# Patient Record
Sex: Male | Born: 1945
Health system: Southern US, Community
[De-identification: ages and names within clinical notes are randomized; demographics above are authoritative.]

## PROBLEM LIST (undated history)

## (undated) DIAGNOSIS — B019 Varicella without complication: Secondary | ICD-10-CM

## (undated) DIAGNOSIS — R079 Chest pain, unspecified: Secondary | ICD-10-CM

## (undated) DIAGNOSIS — R0609 Other forms of dyspnea: Secondary | ICD-10-CM

## (undated) DIAGNOSIS — K635 Polyp of colon: Secondary | ICD-10-CM

## (undated) DIAGNOSIS — I639 Cerebral infarction, unspecified: Secondary | ICD-10-CM

## (undated) DIAGNOSIS — E785 Hyperlipidemia, unspecified: Secondary | ICD-10-CM

## (undated) DIAGNOSIS — M1990 Unspecified osteoarthritis, unspecified site: Secondary | ICD-10-CM

## (undated) DIAGNOSIS — F32A Depression, unspecified: Secondary | ICD-10-CM

## (undated) DIAGNOSIS — Z9889 Other specified postprocedural states: Secondary | ICD-10-CM

## (undated) DIAGNOSIS — I10 Essential (primary) hypertension: Secondary | ICD-10-CM

## (undated) DIAGNOSIS — I509 Heart failure, unspecified: Secondary | ICD-10-CM

## (undated) DIAGNOSIS — R9439 Abnormal result of other cardiovascular function study: Secondary | ICD-10-CM

## (undated) DIAGNOSIS — R0600 Dyspnea, unspecified: Secondary | ICD-10-CM

## (undated) DIAGNOSIS — K5792 Diverticulitis of intestine, part unspecified, without perforation or abscess without bleeding: Secondary | ICD-10-CM

## (undated) DIAGNOSIS — I2510 Atherosclerotic heart disease of native coronary artery without angina pectoris: Secondary | ICD-10-CM

## (undated) DIAGNOSIS — R011 Cardiac murmur, unspecified: Secondary | ICD-10-CM

## (undated) DIAGNOSIS — E119 Type 2 diabetes mellitus without complications: Secondary | ICD-10-CM

## (undated) DIAGNOSIS — T7840XA Allergy, unspecified, initial encounter: Secondary | ICD-10-CM

## (undated) DIAGNOSIS — K219 Gastro-esophageal reflux disease without esophagitis: Secondary | ICD-10-CM

## (undated) DIAGNOSIS — F329 Major depressive disorder, single episode, unspecified: Secondary | ICD-10-CM

## (undated) HISTORY — DX: Depression, unspecified: F32.A

## (undated) HISTORY — PX: KNEE SURGERY: SHX244

## (undated) HISTORY — DX: Varicella without complication: B01.9

## (undated) HISTORY — DX: Other forms of dyspnea: R06.09

## (undated) HISTORY — DX: Cerebral infarction, unspecified: I63.9

## (undated) HISTORY — PX: SHOULDER SURGERY: SHX246

## (undated) HISTORY — DX: Diverticulitis of intestine, part unspecified, without perforation or abscess without bleeding: K57.92

## (undated) HISTORY — DX: Other specified postprocedural states: Z98.890

## (undated) HISTORY — DX: Abnormal result of other cardiovascular function study: R94.39

## (undated) HISTORY — DX: Polyp of colon: K63.5

## (undated) HISTORY — DX: Gastro-esophageal reflux disease without esophagitis: K21.9

## (undated) HISTORY — PX: TONSILLECTOMY: SUR1361

## (undated) HISTORY — DX: Chest pain, unspecified: R07.9

## (undated) HISTORY — PX: COLONOSCOPY: SHX174

## (undated) HISTORY — DX: Allergy, unspecified, initial encounter: T78.40XA

## (undated) HISTORY — DX: Hyperlipidemia, unspecified: E78.5

## (undated) HISTORY — DX: Major depressive disorder, single episode, unspecified: F32.9

## (undated) HISTORY — DX: Cardiac murmur, unspecified: R01.1

## (undated) HISTORY — DX: Dyspnea, unspecified: R06.00

---

## 2013-10-24 ENCOUNTER — Emergency Department (HOSPITAL_BASED_OUTPATIENT_CLINIC_OR_DEPARTMENT_OTHER)
Admission: EM | Admit: 2013-10-24 | Discharge: 2013-10-24 | Disposition: A | Discharge status: Home / Self Care | Payer: Medicare HMO | Attending: Emergency Medicine | Admitting: Emergency Medicine

## 2013-10-24 ENCOUNTER — Emergency Department (HOSPITAL_BASED_OUTPATIENT_CLINIC_OR_DEPARTMENT_OTHER): Payer: Medicare HMO

## 2013-10-24 ENCOUNTER — Encounter (HOSPITAL_BASED_OUTPATIENT_CLINIC_OR_DEPARTMENT_OTHER): Payer: Self-pay | Admitting: Emergency Medicine

## 2013-10-24 DIAGNOSIS — M129 Arthropathy, unspecified: Secondary | ICD-10-CM | POA: Insufficient documentation

## 2013-10-24 DIAGNOSIS — J189 Pneumonia, unspecified organism: Secondary | ICD-10-CM

## 2013-10-24 DIAGNOSIS — R071 Chest pain on breathing: Secondary | ICD-10-CM | POA: Insufficient documentation

## 2013-10-24 DIAGNOSIS — J159 Unspecified bacterial pneumonia: Secondary | ICD-10-CM | POA: Insufficient documentation

## 2013-10-24 DIAGNOSIS — Z7982 Long term (current) use of aspirin: Secondary | ICD-10-CM | POA: Insufficient documentation

## 2013-10-24 DIAGNOSIS — Z880 Allergy status to penicillin: Secondary | ICD-10-CM | POA: Insufficient documentation

## 2013-10-24 DIAGNOSIS — Z79899 Other long term (current) drug therapy: Secondary | ICD-10-CM | POA: Insufficient documentation

## 2013-10-24 DIAGNOSIS — I509 Heart failure, unspecified: Secondary | ICD-10-CM | POA: Insufficient documentation

## 2013-10-24 DIAGNOSIS — I10 Essential (primary) hypertension: Secondary | ICD-10-CM | POA: Insufficient documentation

## 2013-10-24 DIAGNOSIS — R0789 Other chest pain: Secondary | ICD-10-CM

## 2013-10-24 DIAGNOSIS — I2510 Atherosclerotic heart disease of native coronary artery without angina pectoris: Secondary | ICD-10-CM | POA: Insufficient documentation

## 2013-10-24 DIAGNOSIS — E119 Type 2 diabetes mellitus without complications: Secondary | ICD-10-CM | POA: Insufficient documentation

## 2013-10-24 HISTORY — DX: Heart failure, unspecified: I50.9

## 2013-10-24 HISTORY — DX: Type 2 diabetes mellitus without complications: E11.9

## 2013-10-24 HISTORY — DX: Unspecified osteoarthritis, unspecified site: M19.90

## 2013-10-24 HISTORY — DX: Essential (primary) hypertension: I10

## 2013-10-24 HISTORY — DX: Atherosclerotic heart disease of native coronary artery without angina pectoris: I25.10

## 2013-10-24 LAB — CBC WITH DIFFERENTIAL/PLATELET
Basophils Absolute: 0 10*3/uL (ref 0.0–0.1)
Basophils Relative: 0 % (ref 0–1)
Eosinophils Absolute: 0.2 10*3/uL (ref 0.0–0.7)
Eosinophils Relative: 3 % (ref 0–5)
HCT: 41.9 % (ref 39.0–52.0)
HEMOGLOBIN: 14.9 g/dL (ref 13.0–17.0)
Lymphocytes Relative: 29 % (ref 12–46)
Lymphs Abs: 2.5 10*3/uL (ref 0.7–4.0)
MCH: 32.7 pg (ref 26.0–34.0)
MCHC: 35.6 g/dL (ref 30.0–36.0)
MCV: 92.1 fL (ref 78.0–100.0)
MONOS PCT: 6 % (ref 3–12)
Monocytes Absolute: 0.5 10*3/uL (ref 0.1–1.0)
NEUTROS PCT: 62 % (ref 43–77)
Neutro Abs: 5.5 10*3/uL (ref 1.7–7.7)
Platelets: 170 10*3/uL (ref 150–400)
RBC: 4.55 MIL/uL (ref 4.22–5.81)
RDW: 13 % (ref 11.5–15.5)
WBC: 8.8 10*3/uL (ref 4.0–10.5)

## 2013-10-24 LAB — BASIC METABOLIC PANEL
BUN: 11 mg/dL (ref 6–23)
CHLORIDE: 104 meq/L (ref 96–112)
CO2: 26 mEq/L (ref 19–32)
Calcium: 9.8 mg/dL (ref 8.4–10.5)
Creatinine, Ser: 1.3 mg/dL (ref 0.50–1.35)
GFR calc Af Amer: 64 mL/min — ABNORMAL LOW (ref >90)
GFR calc non Af Amer: 55 mL/min — ABNORMAL LOW (ref >90)
Glucose, Bld: 229 mg/dL — ABNORMAL HIGH (ref 70–99)
Potassium: 4.4 mEq/L (ref 3.7–5.3)
Sodium: 142 mEq/L (ref 137–147)

## 2013-10-24 LAB — PRO B NATRIURETIC PEPTIDE: PRO B NATRI PEPTIDE: 361.5 pg/mL — AB (ref 0–125)

## 2013-10-24 LAB — TROPONIN I

## 2013-10-24 MED ORDER — HYDROCODONE-ACETAMINOPHEN 5-325 MG PO TABS: 1.0000 | ORAL_TABLET | ORAL | Status: DC | PRN

## 2013-10-24 MED ORDER — SODIUM CHLORIDE 0.9 % IV SOLN: INTRAVENOUS | Status: DC

## 2013-10-24 MED ORDER — LEVOFLOXACIN 750 MG PO TABS: 750.0000 mg | ORAL_TABLET | Freq: Every day | ORAL | Status: DC

## 2013-10-24 NOTE — ED Provider Notes (Signed)
CSN: [id number]     Arrival date & time 10/24/13  2114 History  This chart was scribed for Leota Jacobsen, MD by Maree Erie, ED Scribe. The patient was seen in room MH11/MH11. Patient's care was started at 9:42 PM.      Chief Complaint  Patient presents with  . Chest Pain      Patient is a 68 y.o. male presenting with chest pain. The history is provided by the patient. No language interpreter was used.  Chest Pain Associated symptoms: cough and headache   Associated symptoms: no diaphoresis and no shortness of breath     HPI Comments: Aaron Maldonado is a 68 y.o. male who presents to the Emergency Department complaining of intermittent chest pain that began a few weeks ago but worsened tonight. Symptoms began after she sustained a fall at home onto his chest wall. He describes the pain as pressure. The pain only occurs with certain movements, such as when coughing or bending over. He has been taking Advil without relief. He reports a dry cough that began a few weeks ago as well. He states he has had a headache for two days. He denies diaphoresis or shortness of breath with the pain. He was seen at an Urgent Care in [place] about three weeks ago for the same pain. Symptoms are persistent and are better when he remains still. No anginal-type qualities to his symptoms. He denies any orthopnea dyspnea on exertion.   Past Medical History  Diagnosis Date  . Coronary artery disease   . Diabetes mellitus without complication   . Hypertension   . Arthritis   . CHF (congestive heart failure)    Past Surgical History  Procedure Laterality Date  . Knee surgery    . Shoulder surgery     No family history on file. History  Substance Use Topics  . Smoking status: Never Smoker   . Smokeless tobacco: Not on file  . Alcohol Use: No    Review of Systems  Constitutional: Negative for diaphoresis.  Respiratory: Positive for cough. Negative for shortness of breath.   Cardiovascular:  Positive for chest pain.  Neurological: Positive for headaches.  All other systems reviewed and are negative.     Allergies  Keflex; Penicillins; and Sulfa antibiotics  Home Medications   Prior to Admission medications   Medication Sig Start Date End Date Taking? Authorizing Provider  allopurinol (ZYLOPRIM) 300 MG tablet Take 300 mg by mouth daily.   Yes Historical Provider, MD  aspirin 325 MG tablet Take 325 mg by mouth daily.   Yes Historical Provider, MD  atorvastatin (LIPITOR) 20 MG tablet Take 20 mg by mouth daily.   Yes Historical Provider, MD  furosemide (LASIX) 40 MG tablet Take 40 mg by mouth.   Yes Historical Provider, MD  losartan (COZAAR) 50 MG tablet Take 50 mg by mouth daily.   Yes Historical Provider, MD  PARoxetine (PAXIL) 20 MG tablet Take 20 mg by mouth daily.   Yes Historical Provider, MD   Triage Vitals: BP 172/86  Pulse 100  Temp(Src) 98.4 F (36.9 C) (Oral)  Ht 5\' 11"  (1.803 m)  Wt 240 lb (108.863 kg)  BMI 33.49 kg/m2  SpO2 100%  Physical Exam  Nursing note and vitals reviewed. Constitutional: He is oriented to person, place, and time. He appears well-developed and well-nourished.  Non-toxic appearance. No distress.  HENT:  Head: Normocephalic and atraumatic.  Eyes: Conjunctivae, EOM and lids are normal. Pupils are equal, round, and  reactive to light.  Neck: Normal range of motion. Neck supple. No tracheal deviation present. No mass present.  Cardiovascular: Normal rate, regular rhythm and normal heart sounds.  Exam reveals no gallop.   No murmur heard. Pulmonary/Chest: Effort normal and breath sounds normal. No stridor. No respiratory distress. He has no decreased breath sounds. He has no wheezes. He has no rhonchi. He has no rales. He exhibits tenderness.  Tender along left anterior chest wall without crepitus.   Abdominal: Soft. Normal appearance and bowel sounds are normal. He exhibits no distension. There is no tenderness. There is no rebound and no  CVA tenderness.  Musculoskeletal: Normal range of motion. He exhibits no edema and no tenderness.  Neurological: He is alert and oriented to person, place, and time. He has normal strength. No cranial nerve deficit or sensory deficit. GCS eye subscore is 4. GCS verbal subscore is 5. GCS motor subscore is 6.  Skin: Skin is warm and dry. No abrasion and no rash noted.  Psychiatric: He has a normal mood and affect. His speech is normal and behavior is normal.    ED Course  Procedures (including critical care time)  DIAGNOSTIC STUDIES: Oxygen Saturation is 100% on room air, normal by my interpretation.    COORDINATION OF CARE: 9:43 PM - Patient verbalizes understanding and agrees with treatment plan.    Labs Review Labs Reviewed  BASIC METABOLIC PANEL - Abnormal; Notable for the following:    Glucose, Bld 229 (*)    GFR calc non Af Amer 55 (*)    GFR calc Af Amer 64 (*)    All other components within normal limits  PRO B NATRIURETIC PEPTIDE - Abnormal; Notable for the following:    Pro B Natriuretic peptide (BNP) 361.5 (*)    All other components within normal limits  CBC WITH DIFFERENTIAL  TROPONIN I    Imaging Review Dg Chest 2 View  10/24/2013   CLINICAL DATA:  Chest pain and nonproductive cough with history of CHF. And diabetes  EXAM: CHEST  2 VIEW  COMPARISON:  None.  FINDINGS: The lungs are adequately inflated. The interstitial markings are increased bilaterally. Increased lung markings in the anterior aspect of the left lower lobe are present. The cardiac silhouette is mildly enlarged. The pulmonary vascularity is not engorged. There is no pleural effusion. Subtle nodularity projects over the lateral aspect of the right ninth rib.  IMPRESSION: Atelectasis or pneumonia anteriorly in the left lower lobe is suspected. There is likely underlying low-grade compensated CHF.   Electronically Signed   By: David  Martinique   On: 10/24/2013 22:34     EKG Interpretation   Date/Time:   Friday October 24 2013 21:23:19 EDT Ventricular Rate:  95 PR Interval:  160 QRS Duration: 80 QT Interval:  362 QTC Calculation: 229 R Axis:   108 Text Interpretation:  Sinus rhythm with frequent and consecutive Premature  ventricular complexes Rightward axis Abnormal ECG Confirmed by Zenia Resides  MD,  Courtlyn Aki ([id number]) on 10/24/2013 10:44:27 PM      MDM   Final diagnoses:  None   I personally performed the services described in this documentation, which was scribed in my presence. The recorded information has been reviewed and is accurate.  Patient with possible early pneumonia. This is likely due to his possible chest wall contusion. I do not think that this represents ACS. He has no signs of CHF at this time. He has no pedal edema. Has no orthopnea. BNP is mildly elevated.  Patient be treated with course of antibiotics and given return precautions. His ectopy was noted on his EKG he says that he has a history of this. He is stable for discharge    Leota Jacobsen, MD 10/24/13 2259

## 2013-10-24 NOTE — ED Notes (Signed)
MD at bedside. 

## 2013-10-24 NOTE — Discharge Instructions (Signed)
Chest Wall Pain Chest wall pain is pain in or around the bones and muscles of your chest. It may take up to 6 weeks to get better. It may take longer if you must stay physically active in your work and activities.  CAUSES  Chest wall pain may happen on its own. However, it may be caused by:  A viral illness like the flu.  Injury.  Coughing.  Exercise.  Arthritis.  Fibromyalgia.  Shingles. HOME CARE INSTRUCTIONS   Avoid overtiring physical activity. Try not to strain or perform activities that cause pain. This includes any activities using your chest or your abdominal and side muscles, especially if heavy weights are used.  Put ice on the sore area.  Put ice in a plastic bag.  Place a towel between your skin and the bag.  Leave the ice on for 15-20 minutes per hour while awake for the first 2 days.  Only take over-the-counter or prescription medicines for pain, discomfort, or fever as directed by your caregiver. SEEK IMMEDIATE MEDICAL CARE IF:   Your pain increases, or you are very uncomfortable.  You have a fever.  Your chest pain becomes worse.  You have new, unexplained symptoms.  You have nausea or vomiting.  You feel sweaty or lightheaded.  You have a cough with phlegm (sputum), or you cough up blood. MAKE SURE YOU:   Understand these instructions.  Will watch your condition.  Will get help right away if you are not doing well or get worse. Document Released: 05/08/2005 Document Revised: 07/31/2011 Document Reviewed: 01/02/2011 [facility] Patient Information 2014 [place], [place]. Pneumonia, Adult Pneumonia is an infection of the lungs.  CAUSES Pneumonia may be caused by bacteria or a virus. Usually, these infections are caused by breathing infectious particles into the lungs (respiratory tract). SYMPTOMS   Cough.  Fever.  Chest pain.  Increased rate of breathing.  Wheezing.  Mucus production. DIAGNOSIS  If you have the common symptoms of  pneumonia, your caregiver will typically confirm the diagnosis with a chest X-ray. The X-ray will show an abnormality in the lung (pulmonary infiltrate) if you have pneumonia. Other tests of your blood, urine, or sputum may be done to find the specific cause of your pneumonia. Your caregiver may also do tests (blood gases or pulse oximetry) to see how well your lungs are working. TREATMENT  Some forms of pneumonia may be spread to other people when you cough or sneeze. You may be asked to wear a mask before and during your exam. Pneumonia that is caused by bacteria is treated with antibiotic medicine. Pneumonia that is caused by the influenza virus may be treated with an antiviral medicine. Most other viral infections must run their course. These infections will not respond to antibiotics.  PREVENTION A pneumococcal shot (vaccine) is available to prevent a common bacterial cause of pneumonia. This is usually suggested for:  People over 61 years old.  Patients on chemotherapy.  People with chronic lung problems, such as bronchitis or emphysema.  People with immune system problems. If you are over 65 or have a high risk condition, you may receive the pneumococcal vaccine if you have not received it before. In some countries, a routine influenza vaccine is also recommended. This vaccine can help prevent some cases of pneumonia.You may be offered the influenza vaccine as part of your care. If you smoke, it is time to quit. You may receive instructions on how to stop smoking. Your caregiver can provide medicines and counseling  to help you quit. HOME CARE INSTRUCTIONS   Cough suppressants may be used if you are losing too much rest. However, coughing protects you by clearing your lungs. You should avoid using cough suppressants if you can.  Your caregiver may have prescribed medicine if he or she thinks your pneumonia is caused by a bacteria or influenza. Finish your medicine even if you start to feel  better.  Your caregiver may also prescribe an expectorant. This loosens the mucus to be coughed up.  Only take over-the-counter or prescription medicines for pain, discomfort, or fever as directed by your caregiver.  Do not smoke. Smoking is a common cause of bronchitis and can contribute to pneumonia. If you are a smoker and continue to smoke, your cough may last several weeks after your pneumonia has cleared.  A cold steam vaporizer or humidifier in your room or home may help loosen mucus.  Coughing is often worse at night. Sleeping in a semi-upright position in a recliner or using a couple pillows under your head will help with this.  Get rest as you feel it is needed. Your body will usually let you know when you need to rest. SEEK IMMEDIATE MEDICAL CARE IF:   Your illness becomes worse. This is especially true if you are elderly or weakened from any other disease.  You cannot control your cough with suppressants and are losing sleep.  You begin coughing up blood.  You develop pain which is getting worse or is uncontrolled with medicines.  You have a fever.  Any of the symptoms which initially brought you in for treatment are getting worse rather than better.  You develop shortness of breath or chest pain. MAKE SURE YOU:   Understand these instructions.  Will watch your condition.  Will get help right away if you are not doing well or get worse. Document Released: 05/08/2005 Document Revised: 07/31/2011 Document Reviewed: 07/28/2010 [facility] Patient Information 2014 [place], [place].

## 2013-10-24 NOTE — ED Notes (Signed)
Chest pain, dry cough headache for a week. Sneezing or movement causes pressure in his chest.

## 2013-10-24 NOTE — ED Notes (Signed)
Pt sts pain x 2-3 weeks.  Reports moving makes the pain worse.  Sts that he does feel some SHOB.

## 2013-11-12 ENCOUNTER — Emergency Department (HOSPITAL_BASED_OUTPATIENT_CLINIC_OR_DEPARTMENT_OTHER): Payer: Medicare HMO

## 2013-11-12 ENCOUNTER — Encounter (HOSPITAL_BASED_OUTPATIENT_CLINIC_OR_DEPARTMENT_OTHER): Payer: Self-pay | Admitting: Emergency Medicine

## 2013-11-12 ENCOUNTER — Emergency Department (HOSPITAL_BASED_OUTPATIENT_CLINIC_OR_DEPARTMENT_OTHER)
Admission: EM | Admit: 2013-11-12 | Discharge: 2013-11-12 | Disposition: A | Discharge status: Home / Self Care | Payer: Medicare HMO | Attending: Emergency Medicine | Admitting: Emergency Medicine

## 2013-11-12 DIAGNOSIS — K219 Gastro-esophageal reflux disease without esophagitis: Secondary | ICD-10-CM

## 2013-11-12 DIAGNOSIS — Z79899 Other long term (current) drug therapy: Secondary | ICD-10-CM | POA: Insufficient documentation

## 2013-11-12 DIAGNOSIS — E119 Type 2 diabetes mellitus without complications: Secondary | ICD-10-CM | POA: Insufficient documentation

## 2013-11-12 DIAGNOSIS — Z880 Allergy status to penicillin: Secondary | ICD-10-CM | POA: Insufficient documentation

## 2013-11-12 DIAGNOSIS — Z792 Long term (current) use of antibiotics: Secondary | ICD-10-CM | POA: Insufficient documentation

## 2013-11-12 DIAGNOSIS — Z7982 Long term (current) use of aspirin: Secondary | ICD-10-CM | POA: Insufficient documentation

## 2013-11-12 DIAGNOSIS — I10 Essential (primary) hypertension: Secondary | ICD-10-CM | POA: Insufficient documentation

## 2013-11-12 DIAGNOSIS — M129 Arthropathy, unspecified: Secondary | ICD-10-CM | POA: Insufficient documentation

## 2013-11-12 DIAGNOSIS — I2510 Atherosclerotic heart disease of native coronary artery without angina pectoris: Secondary | ICD-10-CM | POA: Insufficient documentation

## 2013-11-12 DIAGNOSIS — I509 Heart failure, unspecified: Secondary | ICD-10-CM | POA: Insufficient documentation

## 2013-11-12 LAB — CBC
HCT: 40.6 % (ref 39.0–52.0)
Hemoglobin: 14.5 g/dL (ref 13.0–17.0)
MCH: 32.5 pg (ref 26.0–34.0)
MCHC: 35.7 g/dL (ref 30.0–36.0)
MCV: 91 fL (ref 78.0–100.0)
PLATELETS: 178 10*3/uL (ref 150–400)
RBC: 4.46 MIL/uL (ref 4.22–5.81)
RDW: 12.9 % (ref 11.5–15.5)
WBC: 9.1 10*3/uL (ref 4.0–10.5)

## 2013-11-12 LAB — BASIC METABOLIC PANEL
BUN: 10 mg/dL (ref 6–23)
CHLORIDE: 103 meq/L (ref 96–112)
CO2: 27 meq/L (ref 19–32)
Calcium: 10.1 mg/dL (ref 8.4–10.5)
Creatinine, Ser: 1 mg/dL (ref 0.50–1.35)
GFR calc non Af Amer: 75 mL/min — ABNORMAL LOW (ref >90)
GFR, EST AFRICAN AMERICAN: 87 mL/min — AB (ref >90)
Glucose, Bld: 116 mg/dL — ABNORMAL HIGH (ref 70–99)
POTASSIUM: 4.2 meq/L (ref 3.7–5.3)
SODIUM: 141 meq/L (ref 137–147)

## 2013-11-12 LAB — TROPONIN I

## 2013-11-12 MED ORDER — IOHEXOL 350 MG/ML SOLN
80.0000 mL | Freq: Once | INTRAVENOUS | Status: AC | PRN
  Administered 2013-11-12: 80 mL via INTRAVENOUS

## 2013-11-12 MED ORDER — GI COCKTAIL ~~LOC~~
ORAL | Status: AC
  Filled 2013-11-12: qty 30

## 2013-11-12 MED ORDER — GI COCKTAIL ~~LOC~~
10.0000 mL | Freq: Once | ORAL | Status: AC
  Administered 2013-11-12: 10 mL via ORAL

## 2013-11-12 MED ORDER — KETOROLAC TROMETHAMINE 30 MG/ML IJ SOLN
30.0000 mg | Freq: Once | INTRAMUSCULAR | Status: AC
  Administered 2013-11-12: 30 mg via INTRAVENOUS
  Filled 2013-11-12: qty 1

## 2013-11-12 MED ORDER — OMEPRAZOLE 20 MG PO CPDR: 20.0000 mg | DELAYED_RELEASE_CAPSULE | Freq: Every day | ORAL | Status: DC

## 2013-11-12 NOTE — ED Provider Notes (Signed)
CSN: [id number]     Arrival date & time 11/12/13  0130 History   First MD Initiated Contact with Patient 11/12/13 0210     Chief Complaint  Patient presents with  . Chest Pain     (Consider location/radiation/quality/duration/timing/severity/associated sxs/prior Treatment) Patient is a 68 y.o. male presenting with chest pain. The history is provided by the patient.  Chest Pain Pain location:  L chest Pain quality: dull   Pain radiates to:  Does not radiate Pain radiates to the back: no   Pain severity:  Moderate Onset quality:  Gradual Duration:  18 hours Timing:  Constant Progression:  Unchanged Chronicity:  Recurrent Context: not raising an arm and no trauma   Relieved by:  Nothing Worsened by:  Nothing tried Ineffective treatments:  None tried Associated symptoms: no cough, no diaphoresis, no fever and no shortness of breath   Risk factors: no surgery     Past Medical History  Diagnosis Date  . Coronary artery disease   . Diabetes mellitus without complication   . Hypertension   . Arthritis   . CHF (congestive heart failure)    Past Surgical History  Procedure Laterality Date  . Knee surgery    . Shoulder surgery     History reviewed. No pertinent family history. History  Substance Use Topics  . Smoking status: Never Smoker   . Smokeless tobacco: Not on file  . Alcohol Use: No    Review of Systems  Constitutional: Negative for fever and diaphoresis.  Respiratory: Negative for cough and shortness of breath.   Cardiovascular: Positive for chest pain.  All other systems reviewed and are negative.     Allergies  Keflex; Penicillins; and Sulfa antibiotics  Home Medications   Prior to Admission medications   Medication Sig Start Date End Date Taking? Authorizing Provider  allopurinol (ZYLOPRIM) 300 MG tablet Take 300 mg by mouth daily.   Yes Historical Provider, MD  aspirin 325 MG tablet Take 325 mg by mouth daily.   Yes Historical Provider, MD   atorvastatin (LIPITOR) 20 MG tablet Take 20 mg by mouth daily.   Yes Historical Provider, MD  furosemide (LASIX) 40 MG tablet Take 40 mg by mouth.   Yes Historical Provider, MD  losartan (COZAAR) 50 MG tablet Take 50 mg by mouth daily.   Yes Historical Provider, MD  PARoxetine (PAXIL) 20 MG tablet Take 20 mg by mouth daily.   Yes Historical Provider, MD  HYDROcodone-acetaminophen (NORCO/VICODIN) 5-325 MG per tablet Take 1-2 tablets by mouth every 4 (four) hours as needed for moderate pain or severe pain. 10/24/13   Leota Jacobsen, MD  levofloxacin (LEVAQUIN) 750 MG tablet Take 1 tablet (750 mg total) by mouth daily. 10/24/13   Leota Jacobsen, MD  omeprazole (PRILOSEC) 20 MG capsule Take 1 capsule (20 mg total) by mouth daily. 11/12/13   April K Palumbo-Rasch, MD   BP 142/66  Pulse 61  Temp(Src) 98.4 F (36.9 C) (Oral)  Resp 18  Ht 5\' 11"  (1.803 m)  Wt 225 lb (102.059 kg)  BMI 31.39 kg/m2  SpO2 100% Physical Exam  Constitutional: He is oriented to person, place, and time. He appears well-developed and well-nourished. No distress.  HENT:  Head: Normocephalic and atraumatic.  Mouth/Throat: Oropharynx is clear and moist.  Eyes: Conjunctivae are normal. Pupils are equal, round, and reactive to light.  Neck: Normal range of motion. Neck supple.  Cardiovascular: Normal rate, regular rhythm and intact distal pulses.   Pulmonary/Chest: Effort  normal and breath sounds normal. No respiratory distress. He has no wheezes. He has no rales.  Abdominal: Soft. Bowel sounds are increased. There is no tenderness. There is no rebound and no guarding.  Hyperactive bowel sounds most intense over the epigastrum and heard well into the thoracic cavity  Musculoskeletal: Normal range of motion.  Neurological: He is alert and oriented to person, place, and time.  Skin: Skin is warm and dry.  Psychiatric: He has a normal mood and affect.    ED Course  Procedures (including critical care time) Labs  Review Labs Reviewed  BASIC METABOLIC PANEL - Abnormal; Notable for the following:    Glucose, Bld 116 (*)    GFR calc non Af Amer 75 (*)    GFR calc Af Amer 87 (*)    All other components within normal limits  CBC  TROPONIN I    Imaging Review Dg Chest 2 View  11/12/2013   CLINICAL DATA:  Intermittent right-sided chest pain and headache since last night.  EXAM: CHEST  2 VIEW  COMPARISON:  10/24/2013  FINDINGS: Normal heart size and pulmonary vascularity. Persistent infiltration or atelectasis in the left lingula causing some obscuration of the left heart border. This is unchanged since previous study. No blunting of costophrenic angles. No pneumothorax. Degenerative changes in the spine.  IMPRESSION: Infiltration or atelectasis suggested in the left lingula without significant interval progression.   Electronically Signed   By: Lucienne Capers M.D.   On: 11/12/2013 02:21   Ct Angio Chest Pe W/cm &/or Wo Cm  11/12/2013   CLINICAL DATA:  Chest pain. Headache. Persistent lesion on chest x-ray.  EXAM: CT ANGIOGRAPHY CHEST WITH CONTRAST  TECHNIQUE: Multidetector CT imaging of the chest was performed using the standard protocol during bolus administration of intravenous contrast. Multiplanar CT image reconstructions and MIPs were obtained to evaluate the vascular anatomy.  CONTRAST:  1mL OMNIPAQUE IOHEXOL 350 MG/ML SOLN  COMPARISON:  None.  FINDINGS: Technically adequate study with good opacification of the central and segmental pulmonary arteries. No focal filling defects demonstrated. No evidence of significant pulmonary embolus.  Mild cardiac enlargement. Mild ectasia of the descending thoracic aorta. Calcification of aorta. Mild Coronary artery calcification. No evidence of aortic dissection. Great vessel origins are patent. Esophagus is decompressed. Scattered lymph nodes in the mediastinum are not pathologically enlarged. No pleural effusions. Visualized portions of the upper abdominal organs are  grossly unremarkable.  Evaluation of lung feels is limited due to respiratory motion artifact. No significant airspace disease or consolidation is suggested. Appearance of infiltration on chest radiograph corresponds to prominent pericardiac fat. No pneumothorax. Degenerative changes in the spine. No destructive bone lesions.  Review of the MIP images confirms the above findings.  IMPRESSION: No evidence of significant pulmonary embolus.   Electronically Signed   By: Lucienne Capers M.D.   On: 11/12/2013 03:18     EKG Interpretation None      MDM   Final diagnoses:  Gastroesophageal reflux disease, esophagitis presence not specified     Date: 11/12/2013  Rate: 62  Rhythm: normal sinus rhythm  QRS Axis: right  Intervals: normal  ST/T Wave abnormalities: normal  Conduction Disutrbances:none  Narrative Interpretation: pvcs  Old EKG Reviewed: none available   Symptoms not consistent with ACS.  In the setting of > 8 hours of constant pain with negative EKG and troponin ACS is excluded.  Symptoms consistent with GERD.  Will treat follow up with your regular doctor for ongoing care.  Carlisle Beers, MD 11/12/13 [phone]

## 2013-11-12 NOTE — ED Notes (Addendum)
Pt reports intermittent right sided chest pain - denies pain at present. States he has had a headache since last night and was unable to sleep. States BP elevated PTA. Reports out of Lasix and thinks this is causing his problems. EKG obtained. Pt states recently treated for Pneumonia.

## 2013-11-25 ENCOUNTER — Encounter: Payer: Self-pay | Admitting: Cardiovascular Disease

## 2013-11-25 ENCOUNTER — Ambulatory Visit (INDEPENDENT_AMBULATORY_CARE_PROVIDER_SITE_OTHER): Payer: Medicare HMO | Admitting: Cardiovascular Disease

## 2013-11-25 VITALS — BP 144/76 | HR 84 | Ht 71.0 in | Wt 223.2 lb

## 2013-11-25 DIAGNOSIS — R0989 Other specified symptoms and signs involving the circulatory and respiratory systems: Secondary | ICD-10-CM

## 2013-11-25 DIAGNOSIS — I10 Essential (primary) hypertension: Secondary | ICD-10-CM

## 2013-11-25 DIAGNOSIS — E119 Type 2 diabetes mellitus without complications: Secondary | ICD-10-CM

## 2013-11-25 DIAGNOSIS — Z79899 Other long term (current) drug therapy: Secondary | ICD-10-CM

## 2013-11-25 DIAGNOSIS — R0609 Other forms of dyspnea: Secondary | ICD-10-CM | POA: Insufficient documentation

## 2013-11-25 DIAGNOSIS — E785 Hyperlipidemia, unspecified: Secondary | ICD-10-CM

## 2013-11-25 DIAGNOSIS — Z794 Long term (current) use of insulin: Secondary | ICD-10-CM

## 2013-11-25 DIAGNOSIS — R079 Chest pain, unspecified: Secondary | ICD-10-CM

## 2013-11-25 MED ORDER — LOSARTAN POTASSIUM 50 MG PO TABS: 50.0000 mg | ORAL_TABLET | Freq: Every day | ORAL | Status: DC

## 2013-11-25 NOTE — Assessment & Plan Note (Signed)
Six-month history of exertional chest pain and dyspnea. He has positive risk factors including treated diabetes, hypertension and hyperlipidemia. I'm going to obtain an exercise Myoview stress test and 2-D echocardiogram

## 2013-11-25 NOTE — Patient Instructions (Signed)
  We will see you back in follow up after the tests.    Dr Gwenlyn Found has ordered : 1. Lexiscan Myoview- this is a test that looks at the blood flow to your heart muscle.  It takes approximately 2 1/2 hours. Please follow instruction sheet, as given.   2.  Echocardiogram. Echocardiography is a painless test that uses sound waves to create images of your heart. It provides your doctor with information about the size and shape of your heart and how well your heart's chambers and valves are working. This procedure takes approximately one hour. There are no restrictions for this procedure.   3. Your physician recommends that you return for lab work in: fasting

## 2013-11-25 NOTE — Assessment & Plan Note (Signed)
On statin therapy. We'll recheck a lipid and liver profile 

## 2013-11-25 NOTE — Assessment & Plan Note (Signed)
Controlled on current medications 

## 2013-11-25 NOTE — Progress Notes (Signed)
11/25/2013 Aaron Maldonado   1945-11-07  [id number]  Primary Physician Pcp Not In System Primary Cardiologist: Aaron Harp MD Renae Gloss   HPI:  Aaron Maldonado is a 68 year old moderately overweight divorced Caucasian male father of 4 living children (one deceased) Bentyl and 6 grandchildren accompanied by one of his daughters today. He is a retired Building surveyor. His primary care is provided by the [facility] in [place]. He was referred by the ER at [facility] for evaluation of chest pain. His cardiac risk factor profile is remarkable for 60 pack years of tobacco abuse having quit back in 1990. He has treated hypertension, diabetes or hyperlipidemia. There is no family history of heart disease. He's never had a heart attack or stroke. 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 The last 6 months he is to talk exertional chest pain and dyspnea.  Current Outpatient Prescriptions  Medication Sig Dispense Refill  . allopurinol (ZYLOPRIM) 300 MG tablet Take 300 mg by mouth daily.      Marland Kitchen aspirin 325 MG tablet Take 325 mg by mouth daily.      Marland Kitchen atorvastatin (LIPITOR) 20 MG tablet Take 20 mg by mouth daily.      . Calcium Carbonate (CALTRATE 600 PO) Take 1 capsule by mouth daily.      . furosemide (LASIX) 40 MG tablet Take 40 mg by mouth.      Marland Kitchen HYDROcodone-acetaminophen (NORCO/VICODIN) 5-325 MG per tablet Take 1-2 tablets by mouth every 4 (four) hours as needed for moderate pain or severe pain.  12 tablet  0  . levofloxacin (LEVAQUIN) 750 MG tablet Take 1 tablet (750 mg total) by mouth daily.  7 tablet  0  . losartan (COZAAR) 50 MG tablet Take 1 tablet (50 mg total) by mouth daily.  30 tablet  6  . metFORMIN (GLUCOPHAGE) 850 MG tablet Take 850 mg by mouth 3 (three) times daily.      Marland Kitchen omeprazole (PRILOSEC) 20 MG capsule Take 1 capsule (20 mg total) by mouth daily.  30 capsule  0  . PARoxetine (PAXIL) 20 MG tablet  Take 20 mg by mouth daily.      . potassium chloride (K-DUR) 10 MEQ tablet Take 10 mEq by mouth daily.      . ropinirole (REQUIP) 5 MG tablet Take 5 mg by mouth at bedtime.       No current facility-administered medications for this visit.    Allergies  Allergen Reactions  . Keflex [Cephalexin]     unknown  . Lisinopril Cough  . Penicillins     rash  . Sulfa Antibiotics     rash    History   Social History  . Marital Status: Divorced    Spouse Name: N/A    Number of Children: N/A  . Years of Education: N/A   Occupational History  . Not on file.   Social History Main Topics  . Smoking status: Former Smoker    Types: Cigarettes    Quit date: 05/22/1988  . Smokeless tobacco: Not on file  . Alcohol Use: No  . Drug Use: No  . Sexual Activity: Not on file   Other Topics Concern  . Not on file   Social History Narrative  . No narrative on file     Review of Systems: General: negative for chills, fever, night sweats or weight changes.  Cardiovascular: negative for chest pain, dyspnea  on exertion, edema, orthopnea, palpitations, paroxysmal nocturnal dyspnea or shortness of breath Dermatological: negative for rash Respiratory: negative for cough or wheezing Urologic: negative for hematuria Abdominal: negative for nausea, vomiting, diarrhea, bright red blood per rectum, melena, or hematemesis Neurologic: negative for visual changes, syncope, or dizziness All other systems reviewed and are otherwise negative except as noted above.    Blood pressure 144/76, pulse 84, height 5\' 11"  (1.803 m), weight 223 lb 3.2 oz (101.243 kg).  General appearance: alert and no distress Neck: no adenopathy, no carotid bruit, no JVD, supple, symmetrical, trachea midline and thyroid not enlarged, symmetric, no tenderness/mass/nodules Lungs: clear to auscultation bilaterally Heart: regular rate and rhythm, S1, S2 normal, no murmur, click, rub or gallop Extremities: extremities normal,  atraumatic, no cyanosis or edema and 2+ pedal pulses bilaterally  EKG normal sinus rhythm at 84 with occasional PVCs  ASSESSMENT AND PLAN:   Hyperlipidemia On statin therapy. We'll recheck a lipid and liver profile  Essential hypertension Controlled on current medications  Chest pain Six-month history of exertional chest pain and dyspnea. He has positive risk factors including treated diabetes, hypertension and hyperlipidemia. I'm going to obtain an exercise Myoview stress test and 2-D echocardiogram      Aaron Harp MD [facility], [facility] 11/25/2013 11:44 AM

## 2013-12-01 ENCOUNTER — Other Ambulatory Visit: Payer: Self-pay | Admitting: *Deleted

## 2013-12-01 DIAGNOSIS — R079 Chest pain, unspecified: Secondary | ICD-10-CM

## 2013-12-02 ENCOUNTER — Encounter: Payer: Self-pay | Admitting: *Deleted

## 2013-12-02 LAB — HEPATIC FUNCTION PANEL
ALT: 16 U/L (ref 0–53)
AST: 17 U/L (ref 0–37)
Albumin: 4 g/dL (ref 3.5–5.2)
Alkaline Phosphatase: 80 U/L (ref 39–117)
BILIRUBIN DIRECT: 0.2 mg/dL (ref 0.0–0.3)
BILIRUBIN INDIRECT: 0.8 mg/dL (ref 0.2–1.2)
TOTAL PROTEIN: 6.2 g/dL (ref 6.0–8.3)
Total Bilirubin: 1 mg/dL (ref 0.2–1.2)

## 2013-12-02 LAB — LIPID PANEL
CHOL/HDL RATIO: 3.9 ratio
CHOLESTEROL: 133 mg/dL (ref 0–200)
HDL: 34 mg/dL — ABNORMAL LOW (ref >39)
LDL Cholesterol: 57 mg/dL (ref 0–99)
Triglycerides: 211 mg/dL — ABNORMAL HIGH (ref <150)
VLDL: 42 mg/dL — ABNORMAL HIGH (ref 0–40)

## 2013-12-09 ENCOUNTER — Telehealth (HOSPITAL_COMMUNITY): Payer: Self-pay

## 2013-12-09 NOTE — Telephone Encounter (Signed)
Encounter complete. 

## 2013-12-11 ENCOUNTER — Ambulatory Visit (HOSPITAL_COMMUNITY)
Admission: RE | Admit: 2013-12-11 | Discharge: 2013-12-11 | Disposition: A | Discharge status: Home / Self Care | Payer: Medicare HMO | Source: Ambulatory Visit | Attending: Cardiology | Admitting: Cardiology

## 2013-12-11 ENCOUNTER — Encounter (HOSPITAL_COMMUNITY): Payer: Medicare HMO

## 2013-12-11 DIAGNOSIS — I359 Nonrheumatic aortic valve disorder, unspecified: Secondary | ICD-10-CM

## 2013-12-11 DIAGNOSIS — R079 Chest pain, unspecified: Secondary | ICD-10-CM | POA: Diagnosis present

## 2013-12-11 DIAGNOSIS — Z8249 Family history of ischemic heart disease and other diseases of the circulatory system: Secondary | ICD-10-CM | POA: Diagnosis not present

## 2013-12-11 DIAGNOSIS — R0989 Other specified symptoms and signs involving the circulatory and respiratory systems: Principal | ICD-10-CM | POA: Insufficient documentation

## 2013-12-11 DIAGNOSIS — R0609 Other forms of dyspnea: Secondary | ICD-10-CM | POA: Insufficient documentation

## 2013-12-11 DIAGNOSIS — E119 Type 2 diabetes mellitus without complications: Secondary | ICD-10-CM | POA: Insufficient documentation

## 2013-12-11 DIAGNOSIS — R42 Dizziness and giddiness: Secondary | ICD-10-CM | POA: Insufficient documentation

## 2013-12-11 DIAGNOSIS — R002 Palpitations: Secondary | ICD-10-CM | POA: Insufficient documentation

## 2013-12-11 DIAGNOSIS — Z87891 Personal history of nicotine dependence: Secondary | ICD-10-CM | POA: Insufficient documentation

## 2013-12-11 MED ORDER — METOPROLOL TARTRATE 1 MG/ML IV SOLN
5.0000 mg | INTRAVENOUS | Status: DC | PRN
  Administered 2013-12-11: 5 mg via INTRAVENOUS

## 2013-12-11 MED ORDER — TECHNETIUM TC 99M SESTAMIBI GENERIC - CARDIOLITE
30.0000 | Freq: Once | INTRAVENOUS | Status: AC | PRN
  Administered 2013-12-11: 30 via INTRAVENOUS

## 2013-12-11 MED ORDER — TECHNETIUM TC 99M SESTAMIBI GENERIC - CARDIOLITE
10.0000 | Freq: Once | INTRAVENOUS | Status: AC | PRN
  Administered 2013-12-11: 10 via INTRAVENOUS

## 2013-12-11 NOTE — Progress Notes (Signed)
2D Echocardiogram Complete.  12/11/2013   Aaron Maldonado, RDCS

## 2013-12-11 NOTE — Procedures (Addendum)
[place] [address] [place] [address] [phone]  Cardiology Nuclear Med Study  Aaron Maldonado is a 68 y.o. male     MRN : [id number]     DOB: 1945-11-29  Procedure Date: 12/11/2013  Nuclear Med Background Indication for Stress Test:  Evaluation for Ischemia History:  Open heart repair in 1954 for murmur;No other cardiac or respiratory history reported;No prior NUC MPI for comparison. Cardiac Risk Factors: Family History - CAD, History of Smoking, Hypertension, Lipids, NIDDM and Overweight  Symptoms:  Chest Pain, Dizziness, DOE, Fatigue, Light-Headedness and Palpitations   Nuclear Pre-Procedure Caffeine/Decaff Intake:  7:00pm NPO After: 5:00am   IV Site: R Forearm  IV 0.9% NS with Angio Cath:  22g  Chest Size (in):  46"  IV Started by: Rolene Course, RN  Height: 5\' 11"  (1.803 m)  Cup Size: n/a  BMI:  Body mass index is 31.12 kg/(m^2). Weight:  223 lb (101.152 kg)   Tech Comments:  Patient given 5 mg IV Metoprolol per protocol by Rolene Course, RN, verbal order from Dr. Lyman Bishop, bedside    Nuclear Med Study 1 or 2 day study: 1 day  Stress Test Type:  Stress  Order Authorizing Provider:  Quay Burow, MD   Resting Radionuclide: Technetium 41m Sestamibi  Resting Radionuclide Dose: 10.1 mCi   Stress Radionuclide:  Technetium 34m Sestamibi  Stress Radionuclide Dose: 30.3 mCi           Stress Protocol Rest HR:71 Stress HR: 144  Rest BP:182/94 Stress BP: 160/88  Exercise Time (min): 6 METS: 7.0   Predicted Max HR: 152 bpm % Max HR: 94.74 bpm Rate Pressure Product: 28656  Dose of Adenosine (mg):  n/a Dose of Lexiscan: n/a  Dose of Atropine (mg): n/a Dose of Dobutamine: n/a mcg/kg/min (at max HR)  Stress Test Technologist:  Leane Para, CCT Nuclear Technologist: Otho Perl, CNMT   Rest Procedure:  Myocardial perfusion imaging was performed at rest 45 minutes following the intravenous  administration of Technetium 53m Sestamibi. Stress Procedure: The patient performed treadmill exercise using a Bruce Protocol for 6 minutes. The Patient stopped due to SOB and denied chest pain.  There were no significant ST-T changes.  Technetium 22m Sestambi was injected IV at peak exercise and myocardial perfusing imaging was performed after brief delay.  ient Ischemic Dilatation (Normal <1.22):  1.11 LV Ejection Fraction: Study not gated       Rest ECG: Sinus rhythm, RAD, PVCs, nonspecific ST changes.  Stress ECG: No significant ST segment change suggestive of ischemia.  QPS Raw Data Images:  Acquisition technically good; LVE. Stress Images:  There is decreased uptake in the apex. Rest Images:  There is decreased uptake in the apex, less prominent compared to the stress images. Subtraction (SDS):  These findings are consistent with prior apical infarct and very mild peri-infarct ischemia.  Impression Exercise Capacity:  Fair exercise capacity. BP Response:  Hypotensive blood pressure response. Clinical Symptoms:  There is dyspnea. ECG Impression:  No significant ST segment change suggestive of ischemia. Comparison with Prior Nuclear Study: No images to compare  Overall Impression:  High risk stress nuclear study with a hypotensive SBP response (decreased from 175 to 160 with exercise); patient with significant ectopy including multiple bouts of NSVT; medium size, partially reversible, apical defect consistent with prior infarct and very mild peri-infarct ischemia. Patient seen by Dr Debara Pickett during recovery.  LV Wall Motion:  Study not gated; suggest echocardiogram  to further assess.   Aaron Ruths, MD  12/11/2013 5:37 PM

## 2013-12-17 ENCOUNTER — Telehealth: Payer: Self-pay | Admitting: Cardiovascular Disease

## 2013-12-17 NOTE — Telephone Encounter (Signed)
Message copied by Vennie Homans on Wed Dec 17, 2013  1:01 PM ------      Message from: Lorretta Harp      Created: Sun Dec 14, 2013  6:44 PM       Return office visit with me to discuss results at next available. High risk myoview ------

## 2013-12-29 ENCOUNTER — Encounter: Payer: Self-pay | Admitting: Cardiology

## 2013-12-29 ENCOUNTER — Ambulatory Visit (INDEPENDENT_AMBULATORY_CARE_PROVIDER_SITE_OTHER): Payer: Medicare HMO | Admitting: Cardiovascular Disease

## 2013-12-29 ENCOUNTER — Encounter: Payer: Self-pay | Admitting: Cardiovascular Disease

## 2013-12-29 VITALS — BP 110/62 | HR 103 | Ht 71.0 in | Wt 230.1 lb

## 2013-12-29 DIAGNOSIS — R9439 Abnormal result of other cardiovascular function study: Secondary | ICD-10-CM

## 2013-12-29 DIAGNOSIS — Z79899 Other long term (current) drug therapy: Secondary | ICD-10-CM

## 2013-12-29 DIAGNOSIS — D689 Coagulation defect, unspecified: Secondary | ICD-10-CM

## 2013-12-29 DIAGNOSIS — I4729 Other ventricular tachycardia: Secondary | ICD-10-CM

## 2013-12-29 DIAGNOSIS — R5381 Other malaise: Secondary | ICD-10-CM

## 2013-12-29 DIAGNOSIS — I10 Essential (primary) hypertension: Secondary | ICD-10-CM

## 2013-12-29 DIAGNOSIS — I472 Ventricular tachycardia: Secondary | ICD-10-CM | POA: Insufficient documentation

## 2013-12-29 DIAGNOSIS — E785 Hyperlipidemia, unspecified: Secondary | ICD-10-CM

## 2013-12-29 DIAGNOSIS — R079 Chest pain, unspecified: Secondary | ICD-10-CM

## 2013-12-29 DIAGNOSIS — R5383 Other fatigue: Secondary | ICD-10-CM

## 2013-12-29 DIAGNOSIS — Z01810 Encounter for preprocedural cardiovascular examination: Secondary | ICD-10-CM

## 2013-12-29 HISTORY — DX: Abnormal result of other cardiovascular function study: R94.39

## 2013-12-29 MED ORDER — NITROGLYCERIN 0.4 MG SL SUBL: 0.4000 mg | SUBLINGUAL_TABLET | SUBLINGUAL | Status: AC | PRN

## 2013-12-29 NOTE — Assessment & Plan Note (Signed)
Most recent lipids stable on Lipitor he is taking half of a 20 mg tablet daily. Lipid Panel     Component Value Date/Time   CHOL 133 12/01/2013 1152   TRIG 211* 12/01/2013 1152   HDL 34* 12/01/2013 1152   CHOLHDL 3.9 12/01/2013 1152   VLDL 42* 12/01/2013 1152   LDLCALC 57 12/01/2013 1152

## 2013-12-29 NOTE — Assessment & Plan Note (Signed)
Positive with ischemia and scar in patient has no history of myocardial infarction. High risk study we'll plan cardiac catheterization with Right radial cath discussion with Dr. Gwenlyn Found and the patient with 1% chance of stroke, heart attack or death with cardiac catheterization but the benefit outweighs the risk.  Patient did bleed significantly from right femoral stick 8 or 9 years ago he was reassured as this will be a radial stick. Patient and his daughter were agreeable to proceed.  He does care for his sister who has back pain and significant problems he is concerned this may slow down his time for cardiac catheterization.

## 2013-12-29 NOTE — Progress Notes (Signed)
12/29/2013   PCP: Pcp Not In System   Chief Complaint  Patient presents with  . Follow-up    myoview results    Primary Cardiologist:Dr. Adora Fridge   HPI:  68 year old moderately overweight divorced Caucasian male father of 4 living children (one deceased)  and 6 grandchildren accompanied by one of his daughters today. He is a retired Programmer, systems. His primary care is provided by the [facility] in [place]. He was referred by the ER at [facility] for evaluation of chest pain. His cardiac risk factor profile is remarkable for 60 pack years of tobacco abuse having quit back in 1990. He has treated hypertension, diabetes and hyperlipidemia. Mother had MVR due to rheumatic fever she died with CHF. He's never had a heart attack that he is aware of he did have a heart catheterization 8 or 9 years ago in [place] when he presented with a heart rate in the 30s. He was not told he had coronary disease.  He has a history of PVCs for many years do not know how frequently.  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 The last 6 months he is has had exertional chest pain and dyspnea.  He also was diagnosed with possible TIA in 2013 had an MRI in [place] and carotid ultrasound no awareness of any problems with that.  He was seen and evaluated by Dr. Gwenlyn Found and underwent a stress test and echocardiogram.  He is back today for the results the echo revealed EF 40-45% with hypokinesis of the basal-mid inferior septal myocardium was also hypokinesis of the mid-apical anteroseptal myocardium was increased relative contribution of atrial contraction ventricular filling. Mild aortic regurg trivial mitral regurg. His stress test was to clear at high risk he had hypotensive systolic blood pressure response decreased from 175-160 with exercise. Had significant ectopy with multiple bouts of nonsustained ventricular tachycardia he had a  medium-sized partially reversible apical defect consistent with prior infarct and very mild ischemia. Study not gated.  He has had no further pain since his stress test.     Allergies  Allergen Reactions  . Keflex [Cephalexin]     unknown  . Lisinopril Cough  . Penicillins     rash  . Sulfa Antibiotics     rash    Current Outpatient Prescriptions  Medication Sig Dispense Refill  . allopurinol (ZYLOPRIM) 300 MG tablet Take 300 mg by mouth daily.      Marland Kitchen aspirin 325 MG tablet Take 325 mg by mouth daily.      Marland Kitchen atorvastatin (LIPITOR) 20 MG tablet Take 10 mg by mouth daily.       . Calcium Carbonate (CALTRATE 600 PO) Take 1 capsule by mouth daily.      . furosemide (LASIX) 40 MG tablet Take 40 mg by mouth.      . losartan (COZAAR) 50 MG tablet Take 1 tablet (50 mg total) by mouth daily.  30 tablet  6  . metFORMIN (GLUCOPHAGE) 850 MG tablet Take 850 mg by mouth 3 (three) times daily.      Marland Kitchen PARoxetine (PAXIL) 20 MG tablet Take 20 mg by mouth daily.      . potassium chloride (K-DUR) 10 MEQ tablet Take 10 mEq by mouth daily.      . ropinirole (REQUIP) 5 MG tablet Take 5 mg by mouth at bedtime.      Marland Kitchen HYDROcodone-acetaminophen (  NORCO/VICODIN) 5-325 MG per tablet Take 1-2 tablets by mouth every 4 (four) hours as needed for moderate pain or severe pain.  12 tablet  0  . levofloxacin (LEVAQUIN) 750 MG tablet Take 1 tablet (750 mg total) by mouth daily.  7 tablet  0  . nitroGLYCERIN (NITROSTAT) 0.4 MG SL tablet Place 1 tablet (0.4 mg total) under the tongue every 5 (five) minutes as needed for chest pain.  90 tablet  3  . omeprazole (PRILOSEC) 20 MG capsule Take 1 capsule (20 mg total) by mouth daily.  30 capsule  0   No current facility-administered medications for this visit.    Past Medical History  Diagnosis Date  . Coronary artery disease   . Diabetes mellitus without complication   . Hypertension   . Arthritis   . CHF (congestive heart failure)   . Chest pain   . Dyspnea on  exertion   . History of heart surgery   . Abnormal nuclear stress test 12/29/2013    Past Surgical History  Procedure Laterality Date  . Knee surgery    . Shoulder surgery      IFO:YDXAJOI:NO colds or fevers, + weight gain Skin:no rashes or ulcers HEENT:no blurred vision, no congestion CV:see HPI PUL:see HPI GI:no diarrhea constipation or melena, no indigestion GU:no hematuria, no dysuria MS:no joint pain, no claudication Neuro:no syncope, no lightheadedness Endo:+ diabetes- glucose has been running high, no thyroid disease  Wt Readings from Last 3 Encounters:  12/29/13 230 lb 1 oz (104.356 kg)  12/11/13 223 lb (101.152 kg)  11/25/13 223 lb 3.2 oz (101.243 kg)    PHYSICAL EXAM BP 110/62  Pulse 103  Ht 5\' 11"  (1.803 m)  Wt 230 lb 1 oz (104.356 kg)  BMI 32.10 kg/m2 General:Pleasant affect, NAD Skin:Warm and dry, brisk capillary refill HEENT:normocephalic, sclera clear, mucus membranes moist Neck:supple, no JVD, no bruits  Heart:S1S2 RRR without murmur, gallup, rub or click Lungs:clear without rales, rhonchi, or wheezes MVE:HMCN, non tender, + BS, do not palpate liver spleen or masses Ext:no lower ext edema, 2+ pedal pulses, 2+ radial pulses Neuro:alert and oriented, MAE, follows commands, + facial symmetry EKG: SR with PVCs, PACs, RVH, increased ectopy from previous.  ASSESSMENT AND PLAN Abnormal nuclear stress test Positive with ischemia and scar in patient has no history of myocardial infarction. High risk study we'll plan cardiac catheterization with Right radial cath discussion with Dr. Gwenlyn Found and the patient with 1% chance of stroke, heart attack or death with cardiac catheterization but the benefit outweighs the risk.  Patient did bleed significantly from right femoral stick 8 or 9 years ago he was reassured as this will be a radial stick. Patient and his daughter were agreeable to proceed.  He does care for his sister who has back pain and significant problems he is  concerned this may slow down his time for cardiac catheterization.  NSVT (nonsustained ventricular tachycardia) Episodes with stress test and frequent PVCs on EKG today.  He is aware that he has been diagnosed with PVCs in the past.  Essential hypertension Controlled although would like to add beta blocker but blood pressure is borderline.  Hyperlipidemia Most recent lipids stable on Lipitor he is taking half of a 20 mg tablet daily. Lipid Panel     Component Value Date/Time   CHOL 133 12/01/2013 1152   TRIG 211* 12/01/2013 1152   HDL 34* 12/01/2013 1152   CHOLHDL 3.9 12/01/2013 1152   VLDL 42* 12/01/2013 1152  LDLCALC 57 12/01/2013 1152     Chest pain Currently without chest pain we did add sublingual nitroglycerin the patient's medical regimen.    Agree with note written by Cecilie Kicks RNP  Pt with CP and high risk myoview. Plan OP cath. Adjust antianginals.  Lorretta Harp 12/29/2013 8:23 PM

## 2013-12-29 NOTE — Assessment & Plan Note (Signed)
Currently without chest pain we did add sublingual nitroglycerin the patient's medical regimen.

## 2013-12-29 NOTE — Patient Instructions (Addendum)
We are scheduling a LT. Heart Cath on the 18th of this month with Dr. Gwenlyn Found  Have your labs done 7 days prior to the procedure  Take your SL ntg as directed

## 2013-12-29 NOTE — Assessment & Plan Note (Signed)
Episodes with stress test and frequent PVCs on EKG today.  He is aware that he has been diagnosed with PVCs in the past.

## 2013-12-29 NOTE — Assessment & Plan Note (Signed)
Controlled although would like to add beta blocker but blood pressure is borderline.

## 2013-12-31 LAB — CBC
HCT: 43.2 % (ref 39.0–52.0)
Hemoglobin: 15.4 g/dL (ref 13.0–17.0)
MCH: 31.8 pg (ref 26.0–34.0)
MCHC: 35.6 g/dL (ref 30.0–36.0)
MCV: 89.3 fL (ref 78.0–100.0)
PLATELETS: 191 10*3/uL (ref 150–400)
RBC: 4.84 MIL/uL (ref 4.22–5.81)
RDW: 13.7 % (ref 11.5–15.5)
WBC: 9.7 10*3/uL (ref 4.0–10.5)

## 2013-12-31 LAB — COMPLETE METABOLIC PANEL WITH GFR
ALK PHOS: 92 U/L (ref 39–117)
ALT: 16 U/L (ref 0–53)
AST: 16 U/L (ref 0–37)
Albumin: 4.3 g/dL (ref 3.5–5.2)
BILIRUBIN TOTAL: 1.3 mg/dL — AB (ref 0.2–1.2)
BUN: 15 mg/dL (ref 6–23)
CO2: 29 mEq/L (ref 19–32)
CREATININE: 1.04 mg/dL (ref 0.50–1.35)
Calcium: 10.6 mg/dL — ABNORMAL HIGH (ref 8.4–10.5)
Chloride: 99 mEq/L (ref 96–112)
GFR, EST AFRICAN AMERICAN: 85 mL/min
GFR, EST NON AFRICAN AMERICAN: 73 mL/min
GLUCOSE: 195 mg/dL — AB (ref 70–99)
Potassium: 4 mEq/L (ref 3.5–5.3)
Sodium: 138 mEq/L (ref 135–145)
Total Protein: 6.7 g/dL (ref 6.0–8.3)

## 2014-01-01 LAB — PROTIME-INR
INR: 0.93 (ref <1.50)
PROTHROMBIN TIME: 12.5 s (ref 11.6–15.2)

## 2014-01-01 LAB — URINALYSIS, MICROSCOPIC ONLY
Bacteria, UA: NONE SEEN
CASTS: NONE SEEN
CRYSTALS: NONE SEEN
Squamous Epithelial / LPF: NONE SEEN

## 2014-01-01 LAB — APTT: aPTT: 30 seconds (ref 24–37)

## 2014-01-02 ENCOUNTER — Encounter (HOSPITAL_COMMUNITY): Payer: Self-pay | Admitting: Pharmacy Technician

## 2014-01-02 NOTE — Progress Notes (Signed)
Pt. Informed that his labs were ok and he was cleared for his procedure

## 2014-01-05 ENCOUNTER — Other Ambulatory Visit: Payer: Self-pay | Admitting: *Deleted

## 2014-01-05 DIAGNOSIS — Z01812 Encounter for preprocedural laboratory examination: Secondary | ICD-10-CM

## 2014-01-06 ENCOUNTER — Ambulatory Visit (HOSPITAL_COMMUNITY)
Admission: RE | Admit: 2014-01-06 | Discharge: 2014-01-06 | Disposition: A | Discharge status: Home / Self Care | Payer: Medicare HMO | Source: Ambulatory Visit | Attending: Cardiovascular Disease | Admitting: Cardiovascular Disease

## 2014-01-06 ENCOUNTER — Encounter (HOSPITAL_COMMUNITY)
Admission: RE | Disposition: A | Discharge status: Home / Self Care | Payer: Self-pay | Source: Ambulatory Visit | Attending: Cardiovascular Disease

## 2014-01-06 DIAGNOSIS — Z01812 Encounter for preprocedural laboratory examination: Secondary | ICD-10-CM

## 2014-01-06 DIAGNOSIS — I509 Heart failure, unspecified: Secondary | ICD-10-CM | POA: Diagnosis not present

## 2014-01-06 DIAGNOSIS — Z87891 Personal history of nicotine dependence: Secondary | ICD-10-CM | POA: Diagnosis not present

## 2014-01-06 DIAGNOSIS — R079 Chest pain, unspecified: Secondary | ICD-10-CM | POA: Diagnosis present

## 2014-01-06 DIAGNOSIS — I2510 Atherosclerotic heart disease of native coronary artery without angina pectoris: Secondary | ICD-10-CM | POA: Diagnosis not present

## 2014-01-06 DIAGNOSIS — I472 Ventricular tachycardia, unspecified: Secondary | ICD-10-CM | POA: Insufficient documentation

## 2014-01-06 DIAGNOSIS — Z6832 Body mass index (BMI) 32.0-32.9, adult: Secondary | ICD-10-CM | POA: Insufficient documentation

## 2014-01-06 DIAGNOSIS — E119 Type 2 diabetes mellitus without complications: Secondary | ICD-10-CM | POA: Insufficient documentation

## 2014-01-06 DIAGNOSIS — E663 Overweight: Secondary | ICD-10-CM | POA: Diagnosis not present

## 2014-01-06 DIAGNOSIS — R9439 Abnormal result of other cardiovascular function study: Secondary | ICD-10-CM

## 2014-01-06 DIAGNOSIS — E785 Hyperlipidemia, unspecified: Secondary | ICD-10-CM | POA: Insufficient documentation

## 2014-01-06 DIAGNOSIS — R072 Precordial pain: Secondary | ICD-10-CM

## 2014-01-06 DIAGNOSIS — I10 Essential (primary) hypertension: Secondary | ICD-10-CM | POA: Insufficient documentation

## 2014-01-06 DIAGNOSIS — I4729 Other ventricular tachycardia: Secondary | ICD-10-CM

## 2014-01-06 DIAGNOSIS — Z7982 Long term (current) use of aspirin: Secondary | ICD-10-CM | POA: Insufficient documentation

## 2014-01-06 HISTORY — PX: LEFT HEART CATHETERIZATION WITH CORONARY ANGIOGRAM: SHX5451

## 2014-01-06 LAB — GLUCOSE, CAPILLARY
GLUCOSE-CAPILLARY: 159 mg/dL — AB (ref 70–99)
Glucose-Capillary: 190 mg/dL — ABNORMAL HIGH (ref 70–99)

## 2014-01-06 SURGERY — LEFT HEART CATHETERIZATION WITH CORONARY ANGIOGRAM
Anesthesia: LOCAL

## 2014-01-06 MED ORDER — ASPIRIN 81 MG PO CHEW: 81.0000 mg | CHEWABLE_TABLET | Freq: Every day | ORAL | Status: DC

## 2014-01-06 MED ORDER — SODIUM CHLORIDE 0.9 % IJ SOLN: 3.0000 mL | INTRAMUSCULAR | Status: DC | PRN

## 2014-01-06 MED ORDER — ACETAMINOPHEN 325 MG PO TABS
ORAL_TABLET | ORAL | Status: AC
  Administered 2014-01-06: 650 mg via ORAL
  Filled 2014-01-06: qty 2

## 2014-01-06 MED ORDER — ONDANSETRON HCL 4 MG/2ML IJ SOLN: 4.0000 mg | Freq: Four times a day (QID) | INTRAMUSCULAR | Status: DC | PRN

## 2014-01-06 MED ORDER — MORPHINE SULFATE 2 MG/ML IJ SOLN: 1.0000 mg | INTRAMUSCULAR | Status: DC | PRN

## 2014-01-06 MED ORDER — ACETAMINOPHEN 325 MG PO TABS
650.0000 mg | ORAL_TABLET | ORAL | Status: DC | PRN
  Administered 2014-01-06: 650 mg via ORAL

## 2014-01-06 MED ORDER — SODIUM CHLORIDE 0.9 % IV SOLN
INTRAVENOUS | Status: AC
Start: 2014-01-06 — End: 2014-01-06

## 2014-01-06 MED ORDER — HYDRALAZINE HCL 20 MG/ML IJ SOLN: 10.0000 mg | INTRAMUSCULAR | Status: DC | PRN

## 2014-01-06 MED ORDER — SODIUM CHLORIDE 0.9 % IV SOLN
INTRAVENOUS | Status: DC
  Administered 2014-01-06: 10:00:00 via INTRAVENOUS

## 2014-01-06 MED ORDER — ASPIRIN 81 MG PO CHEW: 81.0000 mg | CHEWABLE_TABLET | ORAL | Status: DC

## 2014-01-06 NOTE — CV Procedure (Addendum)
Aaron Maldonado is a 68 y.o. male    [id number] LOCATION:  FACILITY: [facility]  PHYSICIAN: Quay Burow, M.D. 04/23/1946   DATE OF PROCEDURE:  01/06/2014  DATE OF DISCHARGE:     CARDIAC CATHETERIZATION     History obtained from chart review.68 year old moderately overweight divorced Caucasian male father of 4 living children (one deceased) and 6 grandchildren accompanied by one of his daughters today. He is a retired Programmer, systems. His primary care is provided by the [facility] in [place]. He was referred by the ER at [facility] for evaluation of chest pain. His cardiac risk factor profile is remarkable for 60 pack years of tobacco abuse having quit back in 1990. He has treated hypertension, diabetes and hyperlipidemia. Mother had MVR due to rheumatic fever she died with CHF. He's never had a heart attack that he is aware of he did have a heart catheterization 8 or 9 years ago in [place] when he presented with a heart rate in the 30s. He was not told he had coronary disease. He has a history of PVCs for many years do not know how frequently. 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 The last 6 months he is has had exertional chest pain and dyspnea. He also was diagnosed with possible TIA in 2013 had an MRI in [place] and carotid ultrasound no awareness of any problems with that.  He was seen and evaluated by Dr. Gwenlyn Found and underwent a stress test and echocardiogram.  He is back today for the results the echo revealed EF 40-45% with hypokinesis of the basal-mid inferior septal myocardium was also hypokinesis of the mid-apical anteroseptal myocardium was increased relative contribution of atrial contraction ventricular filling. Mild aortic regurg trivial mitral regurg. His stress test was to clear at high risk he had hypotensive systolic blood pressure response decreased from 175-160 with exercise. Had significant ectopy with multiple  bouts of nonsustained ventricular tachycardia he had a medium-sized partially reversible apical defect consistent with prior infarct and very mild ischemia. Study not gated.  He has had no further pain since his stress test.     PROCEDURE DESCRIPTION:   The patient was brought to the second floor [facility] cath lab in the postabsorptive state. He Was not premedicated  His right groinwas prepped and shaved in usual sterile fashion. Xylocaine 1% was used for local anesthesia. A 5 French sheath was inserted into the right common femoralartery using standard Seldinger technique.5 French right and left Judkins diagnostic catheters along with a 5 French pigtail catheter were used for selective coronary angiography and left ventriculography respectively. Visipaque was used for the entirety of the case. Retrograde aortic, left ventricular and pullback pressures were recorded.   HEMODYNAMICS:    AO SYSTOLIC/AO DIASTOLIC: 527/78   LV SYSTOLIC/LV DIASTOLIC: 242/35  ANGIOGRAPHIC RESULTS:   1. Left main; normal  2. LAD; normal 3. Left circumflex; nondominant and normal.  4. Right coronary artery; dominant with 30-40% ostial/proximal. There was a 60% smooth posterior lateral branch versus 5. Left ventriculography; RAO left ventriculogram was performed using  25 mL of Visipaque dye at 12 mL/second. The overall LVEF estimated  60 %  Without wall motion abnormalities  IMPRESSION:Aaron Maldonado has noncritical CAD with some mild proximal dominant right and moderate distal posterolateral branch stenosis. He has normal LV function. His anatomy does not explain his Myoview abnormalities. Continue medical therapy recommended.an angiogram was performed of the right common femoral artery  and a MYNX closure device was successfully placed obtaining hemostasis. The patient left the Cath Lab in stable condition. He'll be discharged home after remaining recumbent for 2 hours, and a  mid-level provider will see  him back in the office in one to 2 weeks.  Lorretta Harp MD, [facility] 01/06/2014 1:43 PM

## 2014-01-06 NOTE — H&P (View-Only) (Signed)
12/29/2013   PCP: Pcp Not In System   Chief Complaint  Patient presents with  . Follow-up    myoview results    Primary Cardiologist:Dr. Adora Fridge   HPI:  68 year old moderately overweight divorced Caucasian male father of 4 living children (one deceased)  and 6 grandchildren accompanied by one of his daughters today. He is a retired Programmer, systems. His primary care is provided by the [facility] in [place]. He was referred by the ER at [facility] for evaluation of chest pain. His cardiac risk factor profile is remarkable for 60 pack years of tobacco abuse having quit back in 1990. He has treated hypertension, diabetes and hyperlipidemia. Mother had MVR due to rheumatic fever she died with CHF. He's never had a heart attack that he is aware of he did have a heart catheterization 8 or 9 years ago in [place] when he presented with a heart rate in the 30s. He was not told he had coronary disease.  He has a history of PVCs for many years do not know how frequently.  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 The last 6 months he is has had exertional chest pain and dyspnea.  He also was diagnosed with possible TIA in 2013 had an MRI in [place] and carotid ultrasound no awareness of any problems with that.  He was seen and evaluated by Dr. Gwenlyn Found and underwent a stress test and echocardiogram.  He is back today for the results the echo revealed EF 40-45% with hypokinesis of the basal-mid inferior septal myocardium was also hypokinesis of the mid-apical anteroseptal myocardium was increased relative contribution of atrial contraction ventricular filling. Mild aortic regurg trivial mitral regurg. His stress test was to clear at high risk he had hypotensive systolic blood pressure response decreased from 175-160 with exercise. Had significant ectopy with multiple bouts of nonsustained ventricular tachycardia he had a  medium-sized partially reversible apical defect consistent with prior infarct and very mild ischemia. Study not gated.  He has had no further pain since his stress test.     Allergies  Allergen Reactions  . Keflex [Cephalexin]     unknown  . Lisinopril Cough  . Penicillins     rash  . Sulfa Antibiotics     rash    Current Outpatient Prescriptions  Medication Sig Dispense Refill  . allopurinol (ZYLOPRIM) 300 MG tablet Take 300 mg by mouth daily.      Marland Kitchen aspirin 325 MG tablet Take 325 mg by mouth daily.      Marland Kitchen atorvastatin (LIPITOR) 20 MG tablet Take 10 mg by mouth daily.       . Calcium Carbonate (CALTRATE 600 PO) Take 1 capsule by mouth daily.      . furosemide (LASIX) 40 MG tablet Take 40 mg by mouth.      . losartan (COZAAR) 50 MG tablet Take 1 tablet (50 mg total) by mouth daily.  30 tablet  6  . metFORMIN (GLUCOPHAGE) 850 MG tablet Take 850 mg by mouth 3 (three) times daily.      Marland Kitchen PARoxetine (PAXIL) 20 MG tablet Take 20 mg by mouth daily.      . potassium chloride (K-DUR) 10 MEQ tablet Take 10 mEq by mouth daily.      . ropinirole (REQUIP) 5 MG tablet Take 5 mg by mouth at bedtime.      Marland Kitchen HYDROcodone-acetaminophen (  NORCO/VICODIN) 5-325 MG per tablet Take 1-2 tablets by mouth every 4 (four) hours as needed for moderate pain or severe pain.  12 tablet  0  . levofloxacin (LEVAQUIN) 750 MG tablet Take 1 tablet (750 mg total) by mouth daily.  7 tablet  0  . nitroGLYCERIN (NITROSTAT) 0.4 MG SL tablet Place 1 tablet (0.4 mg total) under the tongue every 5 (five) minutes as needed for chest pain.  90 tablet  3  . omeprazole (PRILOSEC) 20 MG capsule Take 1 capsule (20 mg total) by mouth daily.  30 capsule  0   No current facility-administered medications for this visit.    Past Medical History  Diagnosis Date  . Coronary artery disease   . Diabetes mellitus without complication   . Hypertension   . Arthritis   . CHF (congestive heart failure)   . Chest pain   . Dyspnea on  exertion   . History of heart surgery   . Abnormal nuclear stress test 12/29/2013    Past Surgical History  Procedure Laterality Date  . Knee surgery    . Shoulder surgery      WUX:LKGMWNU:UV colds or fevers, + weight gain Skin:no rashes or ulcers HEENT:no blurred vision, no congestion CV:see HPI PUL:see HPI GI:no diarrhea constipation or melena, no indigestion GU:no hematuria, no dysuria MS:no joint pain, no claudication Neuro:no syncope, no lightheadedness Endo:+ diabetes- glucose has been running high, no thyroid disease  Wt Readings from Last 3 Encounters:  12/29/13 230 lb 1 oz (104.356 kg)  12/11/13 223 lb (101.152 kg)  11/25/13 223 lb 3.2 oz (101.243 kg)    PHYSICAL EXAM BP 110/62  Pulse 103  Ht 5\' 11"  (1.803 m)  Wt 230 lb 1 oz (104.356 kg)  BMI 32.10 kg/m2 General:Pleasant affect, NAD Skin:Warm and dry, brisk capillary refill HEENT:normocephalic, sclera clear, mucus membranes moist Neck:supple, no JVD, no bruits  Heart:S1S2 RRR without murmur, gallup, rub or click Lungs:clear without rales, rhonchi, or wheezes OZD:GUYQ, non tender, + BS, do not palpate liver spleen or masses Ext:no lower ext edema, 2+ pedal pulses, 2+ radial pulses Neuro:alert and oriented, MAE, follows commands, + facial symmetry EKG: SR with PVCs, PACs, RVH, increased ectopy from previous.  ASSESSMENT AND PLAN Abnormal nuclear stress test Positive with ischemia and scar in patient has no history of myocardial infarction. High risk study we'll plan cardiac catheterization with Right radial cath discussion with Dr. Gwenlyn Found and the patient with 1% chance of stroke, heart attack or death with cardiac catheterization but the benefit outweighs the risk.  Patient did bleed significantly from right femoral stick 8 or 9 years ago he was reassured as this will be a radial stick. Patient and his daughter were agreeable to proceed.  He does care for his sister who has back pain and significant problems he is  concerned this may slow down his time for cardiac catheterization.  NSVT (nonsustained ventricular tachycardia) Episodes with stress test and frequent PVCs on EKG today.  He is aware that he has been diagnosed with PVCs in the past.  Essential hypertension Controlled although would like to add beta blocker but blood pressure is borderline.  Hyperlipidemia Most recent lipids stable on Lipitor he is taking half of a 20 mg tablet daily. Lipid Panel     Component Value Date/Time   CHOL 133 12/01/2013 1152   TRIG 211* 12/01/2013 1152   HDL 34* 12/01/2013 1152   CHOLHDL 3.9 12/01/2013 1152   VLDL 42* 12/01/2013 1152  LDLCALC 57 12/01/2013 1152     Chest pain Currently without chest pain we did add sublingual nitroglycerin the patient's medical regimen.    Agree with note written by Cecilie Kicks RNP  Pt with CP and high risk myoview. Plan OP cath. Adjust antianginals.  Lorretta Harp 12/29/2013 8:23 PM

## 2014-01-06 NOTE — Interval H&P Note (Signed)
Cath Lab Visit (complete for each Cath Lab visit)  Clinical Evaluation Leading to the Procedure:   ACS: No.  Non-ACS:    Anginal Classification: CCS II  Anti-ischemic medical therapy: No Therapy  Non-Invasive Test Results: High-risk stress test findings: cardiac mortality >3%/year  Prior CABG: No previous CABG      History and Physical Interval Note:  01/06/2014 12:59 PM  Aaron Maldonado  has presented today for surgery, with the diagnosis of cp  The various methods of treatment have been discussed with the patient and family. After consideration of risks, benefits and other options for treatment, the patient has consented to  Procedure(s): LEFT HEART CATHETERIZATION WITH CORONARY ANGIOGRAM (N/A) as a surgical intervention .  The patient's history has been reviewed, patient examined, no change in status, stable for surgery.  I have reviewed the patient's chart and labs.  Questions were answered to the patient's satisfaction.     Lorretta Harp

## 2014-01-06 NOTE — Discharge Instructions (Signed)

## 2014-01-19 ENCOUNTER — Telehealth: Payer: Self-pay | Admitting: Cardiovascular Disease

## 2014-01-19 NOTE — Telephone Encounter (Signed)
Spoke with pt, Follow up scheduled  

## 2014-01-19 NOTE — Telephone Encounter (Signed)
Pt had cath about 2 weeks ago,does he need a follow up appt?

## 2014-01-21 ENCOUNTER — Ambulatory Visit: Payer: Medicare HMO | Admitting: Cardiovascular Disease

## 2014-01-22 ENCOUNTER — Ambulatory Visit (INDEPENDENT_AMBULATORY_CARE_PROVIDER_SITE_OTHER): Payer: Medicare HMO | Admitting: Cardiology

## 2014-01-22 ENCOUNTER — Encounter: Payer: Self-pay | Admitting: Cardiology

## 2014-01-22 VITALS — BP 137/72 | HR 76 | Ht 71.0 in | Wt 233.0 lb

## 2014-01-22 DIAGNOSIS — I10 Essential (primary) hypertension: Secondary | ICD-10-CM

## 2014-01-22 DIAGNOSIS — R072 Precordial pain: Secondary | ICD-10-CM

## 2014-01-22 DIAGNOSIS — E785 Hyperlipidemia, unspecified: Secondary | ICD-10-CM

## 2014-01-22 NOTE — Patient Instructions (Signed)
Decrease your Aspirin to 81 mg Daily.   Your physician wants you to follow-up in: ONE YEAR with Dr.Berry.  You will receive a reminder letter in the mail two months in advance. If you don't receive a letter, please call our office to schedule the follow-up appointment.

## 2014-01-22 NOTE — Assessment & Plan Note (Signed)
Abnormal Myoview but minor CAD and Nl LVF at cath

## 2014-01-22 NOTE — Assessment & Plan Note (Signed)
B/P controlled 

## 2014-01-22 NOTE — Progress Notes (Signed)
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 He recently moved back to this are to be near his daughter and help take care of his ailing sister.  He was referred to Dr Gwenlyn Found in July by the ER at [facility] for evaluation of chest pain. Myoview done 12/11/13 was "high risk". He was admitted for cath 01/06/14. This revealed Nl LVF and only a 30-40% RCA and a 60% PL branch narrowing. He is seen in the office today for follow up. In retrospect he thinks his chest pain is from a fall he had a few weeks ago when he fell getting into the tub. He admits he still has positional mid sternal pain.   Current Outpatient Prescriptions  Medication Sig Dispense Refill  . allopurinol (ZYLOPRIM) 300 MG tablet Take 300 mg by mouth daily.      Marland Kitchen aspirin 81 MG tablet Take 81 mg by mouth daily.      Marland Kitchen atorvastatin (LIPITOR) 20 MG tablet Take 10 mg by mouth daily.       . calcium carbonate (OS-CAL) 600 MG TABS tablet Take 600 mg by mouth daily.      . furosemide (LASIX) 40 MG tablet Take 40 mg by mouth.      . losartan (COZAAR) 50 MG tablet Take 1 tablet (50 mg total) by mouth daily.  30 tablet  6  . metFORMIN (GLUCOPHAGE) 850 MG tablet Take 850 mg by mouth 3 (three) times daily.      . nitroGLYCERIN (NITROSTAT) 0.4 MG SL tablet Place 1 tablet (0.4 mg total) under the tongue every 5 (five) minutes as needed for chest pain.  90 tablet  3  . omeprazole (PRILOSEC) 20 MG capsule Take 1 capsule (20 mg total) by mouth daily.  30 capsule  0  . PARoxetine (PAXIL) 20 MG tablet Take 20 mg by mouth daily.      . potassium chloride (K-DUR) 10 MEQ tablet Take 10 mEq by mouth daily.      . ropinirole (REQUIP) 5 MG tablet Take 5 mg by mouth at bedtime.       No current facility-administered medications for this visit.    Allergies  Allergen Reactions  . Keflex  [Cephalexin]     unknown  . Lisinopril Cough  . Penicillins     rash  . Sulfa Antibiotics     rash    History   Social History  . Marital Status: Divorced    Spouse Name: N/A    Number of Children: N/A  . Years of Education: N/A   Occupational History  . Not on file.   Social History Main Topics  . Smoking status: Former Smoker    Types: Cigarettes    Quit date: 05/22/1988  . Smokeless tobacco: Not on file  . Alcohol Use: No  . Drug Use: No  . Sexual Activity: Not on file   Other Topics Concern  . Not on file   Social History Narrative  . No narrative on file     Review of Systems: General: negative for chills, fever, night sweats or weight changes.  Cardiovascular: negative for chest pain, dyspnea on exertion, edema, orthopnea, palpitations, paroxysmal nocturnal dyspnea or shortness of breath Dermatological: negative for rash Respiratory: negative for cough or wheezing Urologic: negative for hematuria Abdominal: negative for nausea, vomiting, diarrhea,  bright red blood per rectum, melena, or hematemesis Neurologic: negative for visual changes, syncope, or dizziness All other systems reviewed and are otherwise negative except as noted above.    Blood pressure 137/72, pulse 76, height 5\' 11"  (1.803 m), weight 233 lb (105.688 kg).  General appearance: alert, cooperative and no distress Neck: no carotid bruit and no JVD Lungs: clear to auscultation bilaterally Heart: regular rate and rhythm Extremities: Rt groin without hematoma   ASSESSMENT AND PLAN:   Chest pain Abnormal Myoview but minor CAD and Nl LVF at cath  Diabetes NIDDM  Hyperlipidemia On statin  Essential hypertension B/P controlled   PLAN  He is to see a PCP this week. I suggested he decrease his ASA to 81 mg daily but otherwise no changes. We can see him back in a year.   Sweetie Giebler KPA-C 01/22/2014 12:20 PM

## 2014-01-22 NOTE — Assessment & Plan Note (Signed)
On statin.

## 2014-01-22 NOTE — Assessment & Plan Note (Signed)
NIDDM 

## 2014-04-30 ENCOUNTER — Encounter (HOSPITAL_COMMUNITY): Payer: Self-pay | Admitting: Cardiovascular Disease

## 2014-12-26 ENCOUNTER — Emergency Department (HOSPITAL_BASED_OUTPATIENT_CLINIC_OR_DEPARTMENT_OTHER)
Admission: EM | Admit: 2014-12-26 | Discharge: 2014-12-26 | Disposition: A | Discharge status: Home / Self Care | Payer: Medicare PPO | Attending: Emergency Medicine | Admitting: Emergency Medicine

## 2014-12-26 ENCOUNTER — Encounter (HOSPITAL_BASED_OUTPATIENT_CLINIC_OR_DEPARTMENT_OTHER): Payer: Self-pay | Admitting: Emergency Medicine

## 2014-12-26 ENCOUNTER — Emergency Department (HOSPITAL_BASED_OUTPATIENT_CLINIC_OR_DEPARTMENT_OTHER): Payer: Medicare PPO

## 2014-12-26 DIAGNOSIS — Z79899 Other long term (current) drug therapy: Secondary | ICD-10-CM | POA: Insufficient documentation

## 2014-12-26 DIAGNOSIS — I509 Heart failure, unspecified: Secondary | ICD-10-CM | POA: Diagnosis not present

## 2014-12-26 DIAGNOSIS — R079 Chest pain, unspecified: Secondary | ICD-10-CM | POA: Diagnosis present

## 2014-12-26 DIAGNOSIS — E119 Type 2 diabetes mellitus without complications: Secondary | ICD-10-CM | POA: Insufficient documentation

## 2014-12-26 DIAGNOSIS — Z880 Allergy status to penicillin: Secondary | ICD-10-CM | POA: Diagnosis not present

## 2014-12-26 DIAGNOSIS — I10 Essential (primary) hypertension: Secondary | ICD-10-CM | POA: Insufficient documentation

## 2014-12-26 DIAGNOSIS — I2510 Atherosclerotic heart disease of native coronary artery without angina pectoris: Secondary | ICD-10-CM | POA: Insufficient documentation

## 2014-12-26 DIAGNOSIS — M1990 Unspecified osteoarthritis, unspecified site: Secondary | ICD-10-CM | POA: Insufficient documentation

## 2014-12-26 DIAGNOSIS — R1310 Dysphagia, unspecified: Secondary | ICD-10-CM | POA: Diagnosis not present

## 2014-12-26 DIAGNOSIS — Z7982 Long term (current) use of aspirin: Secondary | ICD-10-CM | POA: Diagnosis not present

## 2014-12-26 DIAGNOSIS — Z87891 Personal history of nicotine dependence: Secondary | ICD-10-CM | POA: Diagnosis not present

## 2014-12-26 LAB — CBC
HEMATOCRIT: 41.7 % (ref 39.0–52.0)
HEMOGLOBIN: 14.5 g/dL (ref 13.0–17.0)
MCH: 32.4 pg (ref 26.0–34.0)
MCHC: 34.8 g/dL (ref 30.0–36.0)
MCV: 93.1 fL (ref 78.0–100.0)
Platelets: 157 10*3/uL (ref 150–400)
RBC: 4.48 MIL/uL (ref 4.22–5.81)
RDW: 13.3 % (ref 11.5–15.5)
WBC: 10.8 10*3/uL — ABNORMAL HIGH (ref 4.0–10.5)

## 2014-12-26 LAB — TROPONIN I: Troponin I: <0.03 ng/mL (ref <0.031)

## 2014-12-26 LAB — BASIC METABOLIC PANEL
ANION GAP: 8 (ref 5–15)
BUN: 16 mg/dL (ref 6–20)
CO2: 29 mmol/L (ref 22–32)
Calcium: 10.2 mg/dL (ref 8.9–10.3)
Chloride: 101 mmol/L (ref 101–111)
Creatinine, Ser: 1.26 mg/dL — ABNORMAL HIGH (ref 0.61–1.24)
GFR calc Af Amer: >60 mL/min (ref >60)
GFR calc non Af Amer: 57 mL/min — ABNORMAL LOW (ref >60)
Glucose, Bld: 209 mg/dL — ABNORMAL HIGH (ref 65–99)
Potassium: 3.8 mmol/L (ref 3.5–5.1)
Sodium: 138 mmol/L (ref 135–145)

## 2014-12-26 LAB — CBG MONITORING, ED: Glucose-Capillary: 203 mg/dL — ABNORMAL HIGH (ref 65–99)

## 2014-12-26 MED ORDER — OMEPRAZOLE 20 MG PO CPDR: 20.0000 mg | DELAYED_RELEASE_CAPSULE | Freq: Two times a day (BID) | ORAL | Status: DC

## 2014-12-26 NOTE — ED Notes (Addendum)
Patient reports that he coughed last night and states that he started to have a pressure in his mid chest. Worsens when he bends down and sometimes when he coughs ans swallows. Patient has marked edema to his bilateral ankles that he states started today.

## 2014-12-26 NOTE — Discharge Instructions (Signed)
Is been referred to a GI doctor for consideration for an endoscopy to see if you have a stricture in your esophagus. Small bites. Chew food well. Avoid large pieces of meat.   Dysphagia Swallowing problems (dysphagia) occur when solids and liquids seem to stick in your throat on the way down to your stomach, or the food takes longer to get to the stomach. Other symptoms include regurgitating food, noises coming from the throat, chest discomfort with swallowing, and a feeling of fullness or the feeling of something being stuck in your throat when swallowing. When blockage in your throat is complete, it may be associated with drooling. CAUSES  Problems with swallowing may occur because of problems with the muscles. The food cannot be propelled in the usual manner into your stomach. You may have ulcers, scar tissue, or inflammation in the tube down which food travels from your mouth to your stomach (esophagus), which blocks food from passing normally into the stomach. Causes of inflammation include:  Acid reflux from your stomach into your esophagus.  Infection.  Radiation treatment for cancer.  Medicines taken without enough fluids to wash them down into your stomach. You may have nerve problems that prevent signals from being sent to the muscles of your esophagus to contract and move your food down to your stomach. Globus pharyngeus is a relatively common problem in which there is a sense of an obstruction or difficulty in swallowing, without any physical abnormalities of the swallowing passages being found. This problem usually improves over time with reassurance and testing to rule out other causes. DIAGNOSIS Dysphagia can be diagnosed and its cause can be determined by tests in which you swallow a white substance that helps illuminate the inside of your throat (contrast medium) while X-rays are taken. Sometimes a flexible telescope that is inserted down your throat (endoscopy) to look at your  esophagus and stomach is used. TREATMENT   If the dysphagia is caused by acid reflux or infection, medicines may be used.  If the dysphagia is caused by problems with your swallowing muscles, swallowing therapy may be used to help you strengthen your swallowing muscles.  If the dysphagia is caused by a blockage or mass, procedures to remove the blockage may be done. HOME CARE INSTRUCTIONS  Try to eat soft food that is easier to swallow and check your weight on a daily basis to be sure that it is not decreasing.  Be sure to drink liquids when sitting upright (not lying down). SEEK MEDICAL CARE IF:  You are losing weight because you are unable to swallow.  You are coughing when you drink liquids (aspiration).  You are coughing up partially digested food. SEEK IMMEDIATE MEDICAL CARE IF:  You are unable to swallow your own saliva .  You are having shortness of breath or a fever, or both.  You have a hoarse voice along with difficulty swallowing. MAKE SURE YOU:  Understand these instructions.  Will watch your condition.  Will get help right away if you are not doing well or get worse. Document Released: 05/05/2000 Document Revised: 09/22/2013 Document Reviewed: 10/25/2012 [facility] Patient Information 2015 [place], [place]. This information is not intended to replace advice given to you by your health care provider. Make sure you discuss any questions you have with your health care provider.

## 2014-12-26 NOTE — ED Provider Notes (Signed)
CSN: [id number]   Arrival date & time 12/26/14 1702  History  This chart was scribed for  Aaron Furry, MD by Altamease Oiler, ED Scribe. This patient was seen in room MH07/MH07 and the patient's care was started at 5:36 PM.  Chief Complaint  Patient presents with  . Chest Pain    HPI The history is provided by the patient. No language interpreter was used.   Aaron Maldonado is a 69 y.o. male with PMHx of CAD, DM, HTN, and CHF who presents to the Emergency Department complaining of intermittent substernal chest pain. Pt reports that he was eating a sub and fries with Coke when it felt like he had a "knot" in his chest that prevented from swallowing. He was unable to finish his meal. At that time the pain was rated 8/10 in severity. At present the pt is pain free.  The episode lasted for minutes and resolved with relaxation. He had a similar episode last night while eating cereal but prior to that he has no history of this. Associated symptoms include nausea that has resolved. No vomiting, exertional SOB or chest pain over the last week, or increased LE swelling. He does cardio and lifts weights 3 times a week without chest pain or SOB.   Past surgical history includes repair of a heart murmur as a child, heart catheterization last year showing a 30% and 50% blockage. No stent placement. Np recent history of heartburn.   Past Medical History  Diagnosis Date  . Coronary artery disease   . Diabetes mellitus without complication   . Hypertension   . Arthritis   . CHF (congestive heart failure)   . Chest pain   . Dyspnea on exertion   . History of heart surgery   . Abnormal nuclear stress test 12/29/2013    Past Surgical History  Procedure Laterality Date  . Knee surgery    . Shoulder surgery    . Left heart catheterization with coronary angiogram N/A 01/06/2014    Procedure: LEFT HEART CATHETERIZATION WITH CORONARY ANGIOGRAM;  Surgeon: Lorretta Harp, MD;  Location: [facility] CATH LAB;  Service:  Cardiovascular;  Laterality: N/A;    Family History  Problem Relation Age of Onset  . Heart failure Mother   . Heart disease Mother   . Mitral valve prolapse Mother     rheumatic fever, MVReplacement  . Alzheimer's disease Father   . Cancer Sister     History  Substance Use Topics  . Smoking status: Former Smoker    Types: Cigarettes    Quit date: 05/22/1988  . Smokeless tobacco: Not on file  . Alcohol Use: No     Review of Systems  Constitutional: Negative for fever, chills, diaphoresis, appetite change and fatigue.  HENT: Negative for mouth sores, sore throat and trouble swallowing.   Eyes: Negative for visual disturbance.  Respiratory: Negative for cough, chest tightness, shortness of breath and wheezing.   Cardiovascular: Positive for chest pain.  Gastrointestinal: Positive for nausea. Negative for vomiting, abdominal pain, diarrhea and abdominal distention.  Endocrine: Negative for polydipsia, polyphagia and polyuria.  Genitourinary: Negative for dysuria, frequency and hematuria.  Musculoskeletal: Negative for gait problem.  Skin: Negative for color change, pallor and rash.  Neurological: Negative for dizziness, syncope, light-headedness and headaches.  Hematological: Does not bruise/bleed easily.  Psychiatric/Behavioral: Negative for behavioral problems and confusion.     Home Medications   Prior to Admission medications   Medication Sig Start Date End Date Taking? Authorizing Provider  allopurinol (ZYLOPRIM) 300 MG tablet Take 300 mg by mouth daily.    Historical Provider, MD  aspirin 81 MG tablet Take 81 mg by mouth daily.    Historical Provider, MD  atorvastatin (LIPITOR) 20 MG tablet Take 10 mg by mouth daily.     Historical Provider, MD  calcium carbonate (OS-CAL) 600 MG TABS tablet Take 600 mg by mouth daily.    Historical Provider, MD  furosemide (LASIX) 40 MG tablet Take 40 mg by mouth.    Historical Provider, MD  losartan (COZAAR) 50 MG tablet Take 1  tablet (50 mg total) by mouth daily. 11/25/13   Lorretta Harp, MD  metFORMIN (GLUCOPHAGE) 850 MG tablet Take 850 mg by mouth 3 (three) times daily.    Historical Provider, MD  nitroGLYCERIN (NITROSTAT) 0.4 MG SL tablet Place 1 tablet (0.4 mg total) under the tongue every 5 (five) minutes as needed for chest pain. 12/29/13   Isaiah Serge, NP  omeprazole (PRILOSEC) 20 MG capsule Take 1 capsule (20 mg total) by mouth 2 (two) times daily. 12/26/14   Aaron Furry, MD  PARoxetine (PAXIL) 20 MG tablet Take 20 mg by mouth daily.    Historical Provider, MD  potassium chloride (K-DUR) 10 MEQ tablet Take 10 mEq by mouth daily.    Historical Provider, MD  ropinirole (REQUIP) 5 MG tablet Take 5 mg by mouth at bedtime.    Historical Provider, MD    Allergies  Keflex; Lisinopril; Penicillins; and Sulfa antibiotics  Triage Vitals: BP 121/64 mmHg  Pulse 85  Temp(Src) 98.6 F (37 C) (Oral)  Resp 18  Ht 5\' 11"  (1.803 m)  Wt 233 lb (105.688 kg)  BMI 32.51 kg/m2  SpO2 95%  Physical Exam  Constitutional: He is oriented to person, place, and time. He appears well-developed and well-nourished. No distress.  HENT:  Head: Normocephalic.  Eyes: Conjunctivae are normal. Pupils are equal, round, and reactive to light. No scleral icterus.  Neck: Normal range of motion. Neck supple. No thyromegaly present.  Cardiovascular: Normal rate and regular rhythm.  Exam reveals no gallop and no friction rub.   No murmur heard. Pulmonary/Chest: Effort normal and breath sounds normal. No respiratory distress. He has no wheezes. He has no rales.  Abdominal: Soft. Bowel sounds are normal. He exhibits no distension. There is no tenderness. There is no rebound.  Musculoskeletal: Normal range of motion.  Trace BLE edema  Neurological: He is alert and oriented to person, place, and time.  Skin: Skin is warm and dry. No rash noted.  Psychiatric: He has a normal mood and affect. His behavior is normal.    ED Course   Procedures   DIAGNOSTIC STUDIES: Oxygen Saturation is 95% on RA, normal by my interpretation.    COORDINATION OF CARE: 5:44 PM Discussed treatment plan which includes CXR, EKG, and labs with pt at bedside and pt agreed to plan.  Labs Review-  Labs Reviewed  BASIC METABOLIC PANEL - Abnormal; Notable for the following:    Glucose, Bld 209 (*)    Creatinine, Ser 1.26 (*)    GFR calc non Af Amer 57 (*)    All other components within normal limits  CBC - Abnormal; Notable for the following:    WBC 10.8 (*)    All other components within normal limits  CBG MONITORING, ED - Abnormal; Notable for the following:    Glucose-Capillary 203 (*)    All other components within normal limits  TROPONIN I  Imaging Review Dg Chest 2 View  12/26/2014   CLINICAL DATA:  Chest pain with sensation of knot after eating today. Coughing episode last night. Initial encounter.  EXAM: CHEST  2 VIEW  COMPARISON:  CT and radiographs 11/12/2013.  FINDINGS: The heart size and mediastinal contours are stable. The lungs are clear. There is no pleural effusion or pneumothorax. Epicardial fat along the left heart border is unchanged. The bones appear stable.  IMPRESSION: Stable chest.  No active cardiopulmonary process.   Electronically Signed   By: Richardean Sale M.D.   On: 12/26/2014 18:37    EKG Interpretation  Date/Time:    Ventricular Rate:    PR Interval:    QRS Duration:   QT Interval:    QTC Calculation:   R Axis:     Text Interpretation:         MDM   Final diagnoses:  Dysphagia    Patient clearly describes episodes of dysphagia. He walks on a treadmill at the gym 3-4 times per week and is so without difficulty with chest pain or shortness of breath. He states he had a nonocclusive cath done with less than 50% lesions over the last 1-2 years. Plan is GI referral. Prilosec. Return here with any worsening symptoms.   I personally performed the services described in this documentation,  which was scribed in my presence. The recorded information has been reviewed and is accurate.     Aaron Furry, MD 12/26/14 [phone]

## 2014-12-26 NOTE — ED Notes (Signed)
CBG 203. K. Kae Heller, RN notified.

## 2015-01-05 ENCOUNTER — Encounter: Payer: Self-pay | Admitting: Gastroenterology

## 2015-03-02 ENCOUNTER — Ambulatory Visit (INDEPENDENT_AMBULATORY_CARE_PROVIDER_SITE_OTHER): Payer: Medicare PPO | Admitting: Gastroenterology

## 2015-03-02 ENCOUNTER — Encounter: Payer: Self-pay | Admitting: Gastroenterology

## 2015-03-02 VITALS — BP 120/70 | Ht 69.0 in | Wt 242.0 lb

## 2015-03-02 DIAGNOSIS — R1310 Dysphagia, unspecified: Secondary | ICD-10-CM

## 2015-03-02 NOTE — Patient Instructions (Addendum)
Resume your OTC omeprazole 20 mg pill once daily (30-60 min before a meal). You will be set up for an upper endoscopy for dysphagia to solids, liquids (november sometime) [employer] well, eat slowly and take small bites.

## 2015-03-02 NOTE — Progress Notes (Signed)
HPI: This is a  very pleasant 69 year old man    who was referred to me by ER Dr. Tanna Furry  to evaluate  dysphasia .    Chief complaint is dysphagia  Dysphagia acute 2 months ago, was eating at [employer], cheese sandwhich.  Has had issues with dysphagia for about a year, getting worse. Liquids and solids.  Can really be either.  Used to have pyrosis.   Was recommended to take omeprazole once daily, has been taking it daily. Completed the script a week ago.   Swallowing a bit better but still note normal.  His weight has been increasing in past 6 months.   Review of systems: Pertinent positive and negative review of systems were noted in the above HPI section. Complete review of systems was performed and was otherwise normal.   Past Medical History  Diagnosis Date  . Coronary artery disease   . Diabetes mellitus without complication ([place])   . Hypertension   . Arthritis   . CHF (congestive heart failure) ([place])   . Chest pain   . Dyspnea on exertion   . History of heart surgery   . Abnormal nuclear stress test 12/29/2013    Past Surgical History  Procedure Laterality Date  . Knee surgery    . Shoulder surgery    . Left heart catheterization with coronary angiogram N/A 01/06/2014    Procedure: LEFT HEART CATHETERIZATION WITH CORONARY ANGIOGRAM;  Surgeon: Lorretta Harp, MD;  Location: [facility] CATH LAB;  Service: Cardiovascular;  Laterality: N/A;    Current Outpatient Prescriptions  Medication Sig Dispense Refill  . allopurinol (ZYLOPRIM) 300 MG tablet Take 300 mg by mouth daily.    Marland Kitchen aspirin 81 MG tablet Take 81 mg by mouth daily.    . calcium carbonate (OS-CAL) 600 MG TABS tablet Take 600 mg by mouth daily.    . furosemide (LASIX) 40 MG tablet Take 40 mg by mouth.    . losartan (COZAAR) 50 MG tablet Take 1 tablet (50 mg total) by mouth daily. 30 tablet 6  . metFORMIN (GLUCOPHAGE) 850 MG tablet Take 850 mg by mouth 3 (three) times daily.    . nitroGLYCERIN  (NITROSTAT) 0.4 MG SL tablet Place 1 tablet (0.4 mg total) under the tongue every 5 (five) minutes as needed for chest pain. 90 tablet 3  . omeprazole (PRILOSEC) 20 MG capsule Take 1 capsule (20 mg total) by mouth 2 (two) times daily. 60 capsule 0  . PARoxetine (PAXIL) 20 MG tablet Take 20 mg by mouth daily.    . potassium chloride (K-DUR) 10 MEQ tablet Take 10 mEq by mouth daily.    . ropinirole (REQUIP) 5 MG tablet Take 5 mg by mouth at bedtime.     No current facility-administered medications for this visit.    Allergies as of 03/02/2015 - Review Complete 03/02/2015  Allergen Reaction Noted  . Keflex [cephalexin]  10/24/2013  . Lisinopril Cough 11/25/2013  . Penicillins  10/24/2013  . Sulfa antibiotics  10/24/2013    Family History  Problem Relation Age of Onset  . Heart failure Mother   . Heart disease Mother   . Mitral valve prolapse Mother     rheumatic fever, MVReplacement  . Alzheimer's disease Father   . Breast cancer Sister     and sacrum cancer  . SIDS Daughter     Social History   Social History  . Marital Status: Divorced    Spouse Name: N/A  . Number of  Children: 3  . Years of Education: N/A   Occupational History  . retired     former Administrator   Social History Main Topics  . Smoking status: Former Smoker    Types: Cigarettes    Quit date: 05/22/1988  . Smokeless tobacco: Not on file  . Alcohol Use: No  . Drug Use: No  . Sexual Activity: Not on file   Other Topics Concern  . Not on file   Social History Narrative     Physical Exam: Ht 5\' 9"  (1.753 m)  Wt 242 lb (109.77 kg)  BMI 35.72 kg/m2 Constitutional: generally well-appearing Psychiatric: alert and oriented x3 Eyes: extraocular movements intact Mouth: oral pharynx moist, no lesions Neck: supple no lymphadenopathy Cardiovascular: heart regular rate and rhythm Lungs: clear to auscultation bilaterally Abdomen: soft, nontender, nondistended, no obvious ascites, no peritoneal signs,  normal bowel sounds Extremities: no lower extremity edema bilaterally Skin: no lesions on visible extremities   Assessment and plan: 69 y.o. male with  mixed solid, liquid dysphagia that is mildly progressive without weight loss or chronic GERD  He has not been losing weight and in fact has been gaining weight over the past 6 months and so I think it is very unlikely that he has underlying cancer as a cause of his dysphagia. His symptoms are somewhat improved with proton pump inhibitor once daily I recommended he resume that for now. I recommended we proceed with upper endoscopy at his soonest convenience. If the esophagus appears normal and he has not had significant, really complete resolution of his symptoms on continue proton pump inhibitor that I would likely recommend we proceed with manometry testing of his esophagus to check for underlying motility disorder.   Owens Loffler, MD [facility] 03/02/2015, 11:15 AM  Cc: ER Dr. Tanna Furry

## 2015-03-26 ENCOUNTER — Ambulatory Visit (AMBULATORY_SURGERY_CENTER): Payer: Medicare PPO | Admitting: Gastroenterology

## 2015-03-26 ENCOUNTER — Encounter: Payer: Self-pay | Admitting: Gastroenterology

## 2015-03-26 VITALS — BP 122/56 | HR 59 | Temp 97.7°F | Resp 15 | Ht 69.0 in | Wt 242.0 lb

## 2015-03-26 DIAGNOSIS — K2990 Gastroduodenitis, unspecified, without bleeding: Secondary | ICD-10-CM

## 2015-03-26 DIAGNOSIS — K2950 Unspecified chronic gastritis without bleeding: Secondary | ICD-10-CM | POA: Diagnosis not present

## 2015-03-26 DIAGNOSIS — K2970 Gastritis, unspecified, without bleeding: Secondary | ICD-10-CM | POA: Diagnosis not present

## 2015-03-26 DIAGNOSIS — R1310 Dysphagia, unspecified: Secondary | ICD-10-CM

## 2015-03-26 DIAGNOSIS — K219 Gastro-esophageal reflux disease without esophagitis: Secondary | ICD-10-CM | POA: Diagnosis not present

## 2015-03-26 LAB — GLUCOSE, CAPILLARY
Glucose-Capillary: 201 mg/dL — ABNORMAL HIGH (ref 65–99)
Glucose-Capillary: 206 mg/dL — ABNORMAL HIGH (ref 65–99)

## 2015-03-26 MED ORDER — SODIUM CHLORIDE 0.9 % IV SOLN: 500.0000 mL | INTRAVENOUS | Status: DC

## 2015-03-26 NOTE — Progress Notes (Signed)
To recovery, report to Sanav Remer, RN, VSS. 

## 2015-03-26 NOTE — Progress Notes (Signed)
Called to room to assist during endoscopic procedure.  Patient ID and intended procedure confirmed with present staff. Received instructions for my participation in the procedure from the performing physician.  

## 2015-03-26 NOTE — Patient Instructions (Signed)
Findings:  Gastritis Recommendations:  Wait for pathology for recommendations  YOU HAD AN ENDOSCOPIC PROCEDURE TODAY AT [facility]:   Refer to the procedure report that was given to you for any specific questions about what was found during the examination.  If the procedure report does not answer your questions, please call your gastroenterologist to clarify.  If you requested that your care partner not be given the details of your procedure findings, then the procedure report has been included in a sealed envelope for you to review at your convenience later.  YOU SHOULD EXPECT: Some feelings of bloating in the abdomen. Passage of more gas than usual.  Walking can help get rid of the air that was put into your GI tract during the procedure and reduce the bloating. If you had a lower endoscopy (such as a colonoscopy or flexible sigmoidoscopy) you may notice spotting of blood in your stool or on the toilet paper. If you underwent a bowel prep for your procedure, you may not have a normal bowel movement for a few days.  Please Note:  You might notice some irritation and congestion in your nose or some drainage.  This is from the oxygen used during your procedure.  There is no need for concern and it should clear up in a day or so.  SYMPTOMS TO REPORT IMMEDIATELY:   Following lower endoscopy (colonoscopy or flexible sigmoidoscopy):  Excessive amounts of blood in the stool  Significant tenderness or worsening of abdominal pains  Swelling of the abdomen that is new, acute  Fever of 100F or higher   Following upper endoscopy (EGD)  Vomiting of blood or coffee ground material  New chest pain or pain under the shoulder blades  Painful or persistently difficult swallowing  New shortness of breath  Fever of 100F or higher  Black, tarry-looking stools  For urgent or emergent issues, a gastroenterologist can be reached at any hour by calling [phone].   DIET: Your first  meal following the procedure should be a small meal and then it is ok to progress to your normal diet. Heavy or fried foods are harder to digest and may make you feel nauseous or bloated.  Likewise, meals heavy in dairy and vegetables can increase bloating.  Drink plenty of fluids but you should avoid alcoholic beverages for 24 hours.  ACTIVITY:  You should plan to take it easy for the rest of today and you should NOT DRIVE or use heavy machinery until tomorrow (because of the sedation medicines used during the test).    FOLLOW UP: Our staff will call the number listed on your records the next business day following your procedure to check on you and address any questions or concerns that you may have regarding the information given to you following your procedure. If we do not reach you, we will leave a message.  However, if you are feeling well and you are not experiencing any problems, there is no need to return our call.  We will assume that you have returned to your regular daily activities without incident.  If any biopsies were taken you will be contacted by phone or by letter within the next 1-3 weeks.  Please call us at [phone] if you have not heard about the biopsies in 3 weeks.    SIGNATURES/CONFIDENTIALITY: You and/or your care partner have signed paperwork which will be entered into your electronic medical record.  These signatures attest to the fact that that  the information above on your After Visit Summary has been reviewed and is understood.  Full responsibility of the confidentiality of this discharge information lies with you and/or your care-partner.  Please follow all discharge instructions given to you by the recovery room nurse. If you have any questions or problems after discharge please call one of the numbers listed above. You will receive a phone call in the am to see how you are doing and answer any questions you may have. Thank you for choosing [facility] for your health care needs.

## 2015-03-26 NOTE — Op Note (Signed)
[facility]  [address] [place], [address]   ENDOSCOPY PROCEDURE REPORT  PATIENT: Aaron Maldonado, Aaron Maldonado  MR#: [id number] BIRTHDATE: [birth year] , 86  yrs. old GENDER: male ENDOSCOPIST: Milus Banister, MD REFERRED BY:  Jefm Petty, M.D. PROCEDURE DATE:  03/26/2015 PROCEDURE:  EGD w/ biopsy ASA CLASS:     Class II INDICATIONS:  dysphagia. MEDICATIONS: Monitored anesthesia care and Propofol 200 mg IV TOPICAL ANESTHETIC: none  DESCRIPTION OF PROCEDURE: After the risks benefits and alternatives of the procedure were thoroughly explained, informed consent was obtained.  The LB [id number] endoscope was introduced through the mouth and advanced to the second portion of the duodenum , Without limitations.  The instrument was slowly withdrawn as the mucosa was fully examined.  There was mild to moderate distal gastritis, somewhat atrophic appearing.  The body and antrum of stomach were biopsied.  The GE junction was normal, without stricture.  The distal and proximal esophagus was sampled separately and sent to pathology.  The examination was otherwise normal.  Retroflexed views revealed no abnormalities.     The scope was then withdrawn from the patient and the procedure completed. COMPLICATIONS: There were no immediate complications.  ENDOSCOPIC IMPRESSION: There was mild to moderate distal gastritis, somewhat atrophic appearing.  The body and antrum of stomach were biopsied.  The GE junction was normal, without stricture.  The distal and proximal esophagus was sampled separately and sent to pathology.  The examination was otherwise normal  RECOMMENDATIONS: Await final pathology for further recommendations.  eSigned:  Milus Banister, MD 03/26/2015 9:48 AM

## 2015-03-29 ENCOUNTER — Telehealth: Payer: Self-pay

## 2015-03-29 NOTE — Telephone Encounter (Signed)
Left a message at [phone] for the pt to call us back if any questions or concerns. maw

## 2015-06-04 ENCOUNTER — Emergency Department (HOSPITAL_BASED_OUTPATIENT_CLINIC_OR_DEPARTMENT_OTHER)
Admission: EM | Admit: 2015-06-04 | Discharge: 2015-06-04 | Disposition: A | Discharge status: Home / Self Care | Payer: Medicare PPO | Attending: Emergency Medicine | Admitting: Emergency Medicine

## 2015-06-04 ENCOUNTER — Emergency Department (HOSPITAL_BASED_OUTPATIENT_CLINIC_OR_DEPARTMENT_OTHER): Payer: Medicare PPO

## 2015-06-04 ENCOUNTER — Encounter (HOSPITAL_BASED_OUTPATIENT_CLINIC_OR_DEPARTMENT_OTHER): Payer: Self-pay | Admitting: *Deleted

## 2015-06-04 DIAGNOSIS — Z9889 Other specified postprocedural states: Secondary | ICD-10-CM | POA: Diagnosis not present

## 2015-06-04 DIAGNOSIS — M1990 Unspecified osteoarthritis, unspecified site: Secondary | ICD-10-CM | POA: Diagnosis not present

## 2015-06-04 DIAGNOSIS — Y998 Other external cause status: Secondary | ICD-10-CM | POA: Diagnosis not present

## 2015-06-04 DIAGNOSIS — I2510 Atherosclerotic heart disease of native coronary artery without angina pectoris: Secondary | ICD-10-CM | POA: Diagnosis not present

## 2015-06-04 DIAGNOSIS — I10 Essential (primary) hypertension: Secondary | ICD-10-CM | POA: Diagnosis not present

## 2015-06-04 DIAGNOSIS — Z7984 Long term (current) use of oral hypoglycemic drugs: Secondary | ICD-10-CM | POA: Insufficient documentation

## 2015-06-04 DIAGNOSIS — I509 Heart failure, unspecified: Secondary | ICD-10-CM | POA: Diagnosis not present

## 2015-06-04 DIAGNOSIS — X500XXA Overexertion from strenuous movement or load, initial encounter: Secondary | ICD-10-CM | POA: Insufficient documentation

## 2015-06-04 DIAGNOSIS — S3992XA Unspecified injury of lower back, initial encounter: Secondary | ICD-10-CM | POA: Diagnosis present

## 2015-06-04 DIAGNOSIS — Z880 Allergy status to penicillin: Secondary | ICD-10-CM | POA: Diagnosis not present

## 2015-06-04 DIAGNOSIS — Y92002 Bathroom of unspecified non-institutional (private) residence single-family (private) house as the place of occurrence of the external cause: Secondary | ICD-10-CM | POA: Insufficient documentation

## 2015-06-04 DIAGNOSIS — G8929 Other chronic pain: Secondary | ICD-10-CM | POA: Diagnosis not present

## 2015-06-04 DIAGNOSIS — E119 Type 2 diabetes mellitus without complications: Secondary | ICD-10-CM | POA: Diagnosis not present

## 2015-06-04 DIAGNOSIS — M5431 Sciatica, right side: Secondary | ICD-10-CM | POA: Diagnosis not present

## 2015-06-04 DIAGNOSIS — Y93E1 Activity, personal bathing and showering: Secondary | ICD-10-CM | POA: Diagnosis not present

## 2015-06-04 DIAGNOSIS — Z79899 Other long term (current) drug therapy: Secondary | ICD-10-CM | POA: Diagnosis not present

## 2015-06-04 DIAGNOSIS — Z87891 Personal history of nicotine dependence: Secondary | ICD-10-CM | POA: Diagnosis not present

## 2015-06-04 DIAGNOSIS — Z7982 Long term (current) use of aspirin: Secondary | ICD-10-CM | POA: Insufficient documentation

## 2015-06-04 MED ORDER — DIAZEPAM 5 MG PO TABS
5.0000 mg | ORAL_TABLET | Freq: Once | ORAL | Status: AC
  Administered 2015-06-04: 5 mg via ORAL
  Filled 2015-06-04: qty 1

## 2015-06-04 MED ORDER — DEXAMETHASONE 6 MG PO TABS
10.0000 mg | ORAL_TABLET | Freq: Once | ORAL | Status: AC
  Administered 2015-06-04: 10 mg via ORAL
  Filled 2015-06-04: qty 1

## 2015-06-04 MED ORDER — OXYCODONE-ACETAMINOPHEN 5-325 MG PO TABS: 1.0000 | ORAL_TABLET | Freq: Four times a day (QID) | ORAL | Status: DC | PRN

## 2015-06-04 MED ORDER — HYDROMORPHONE HCL 1 MG/ML IJ SOLN
1.0000 mg | Freq: Once | INTRAMUSCULAR | Status: AC
  Administered 2015-06-04: 1 mg via INTRAVENOUS
  Filled 2015-06-04: qty 1

## 2015-06-04 NOTE — ED Notes (Signed)
Lower back pain x 2 weeks. States today he twisted in the shower and increased the pain.

## 2015-06-04 NOTE — ED Notes (Signed)
C/o lower back pain x 2 weeks,  This pm twisted wrong and had increased pain  Pain radiating to rt leg,  Ambulatory w diff

## 2015-06-04 NOTE — Discharge Instructions (Signed)
You were seen today for your back pain that radiated down your right leg. This appears to be an inflamed nerve. Your x-ray of your back looked normal. Take the pain medicine prescribed. Try to rest. Follow-up with your primary care physician. If your symptoms persist they may need to send you for further imaging. Sometimes they also sinew for physical therapy or injections. Do not operate heavy machinery, drive, climb ladders or care for young children while taking the pain medicine.  Radicular Pain Radicular pain in either the arm or leg is usually from a bulging or herniated disk in the spine. A piece of the herniated disk may press against the nerves as the nerves exit the spine. This causes pain which is felt at the tips of the nerves down the arm or leg. Other causes of radicular pain may include:  Fractures.  Heart disease.  Cancer.  An abnormal and usually degenerative state of the nervous system or nerves (neuropathy). Diagnosis may require CT or MRI scanning to determine the primary cause.  Nerves that start at the neck (nerve roots) may cause radicular pain in the outer shoulder and arm. It can spread down to the thumb and fingers. The symptoms vary depending on which nerve root has been affected. In most cases radicular pain improves with conservative treatment. Neck problems may require physical therapy, a neck collar, or cervical traction. Treatment may take many weeks, and surgery may be considered if the symptoms do not improve.  Conservative treatment is also recommended for sciatica. Sciatica causes pain to radiate from the lower back or buttock area down the leg into the foot. Often there is a history of back problems. Most patients with sciatica are better after 2 to 4 weeks of rest and other supportive care. Short term bed rest can reduce the disk pressure considerably. Sitting, however, is not a good position since this increases the pressure on the disk. You should avoid bending,  lifting, and all other activities which make the problem worse. Traction can be used in severe cases. Surgery is usually reserved for patients who do not improve within the first months of treatment. Only take over-the-counter or prescription medicines for pain, discomfort, or fever as directed by your caregiver. Narcotics and muscle relaxants may help by relieving more severe pain and spasm and by providing mild sedation. Cold or massage can give significant relief. Spinal manipulation is not recommended. It can increase the degree of disc protrusion. Epidural steroid injections are often effective treatment for radicular pain. These injections deliver medicine to the spinal nerve in the space between the protective covering of the spinal cord and back bones (vertebrae). Your caregiver can give you more information about steroid injections. These injections are most effective when given within two weeks of the onset of pain.  You should see your caregiver for follow up care as recommended. A program for neck and back injury rehabilitation with stretching and strengthening exercises is an important part of management.  SEEK IMMEDIATE MEDICAL CARE IF:  You develop increased pain, weakness, or numbness in your arm or leg.  You develop difficulty with bladder or bowel control.  You develop abdominal pain.   This information is not intended to replace advice given to you by your health care provider. Make sure you discuss any questions you have with your health care provider.   Document Released: 06/15/2004 Document Revised: 05/29/2014 Document Reviewed: 12/02/2014 Elsevier Interactive Patient Education [employer].

## 2015-06-04 NOTE — ED Provider Notes (Signed)
CSN: [id number]     Arrival date & time 06/04/15  1747 History   By signing my name below, I, Altamease Oiler, attest that this documentation has been prepared under the direction and in the presence of Harvel Quale, MD. Electronically Signed: Altamease Oiler, ED Scribe. 06/05/2015. 10:11 PM   Chief Complaint  Patient presents with  . Back Pain   The history is provided by the patient. No language interpreter was used.   Eno Denecke is a 70 y.o. male with history of DM, HTN, CAD, and CHF who presents to the Emergency Department complaining of new, constant, 10/10 in severity, lower back pain with sudden onset today near 3 PM while stretching. Pt states that he had his foot propped up on the bathtub attempting to stretch when he believes that he pulled a muscle. The pain radiates down the RLE and is worse with movement and walking. Since the onset of pain he has been using a cane with ambulation.  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  Pt denies numbness, tingling, weakness, incontinence of bowel or bladder. His blood sugars have been well controlled at home with Glucophage and Glipizide .   Past Medical History  Diagnosis Date  . Coronary artery disease   . Diabetes mellitus without complication ([place])   . Hypertension   . Arthritis   . CHF (congestive heart failure) ([place])   . Chest pain   . Dyspnea on exertion   . History of heart surgery   . Abnormal nuclear stress test 12/29/2013   Past Surgical History  Procedure Laterality Date  . Knee surgery    . Shoulder surgery    . Left heart catheterization with coronary angiogram N/A 01/06/2014    Procedure: LEFT HEART CATHETERIZATION WITH CORONARY ANGIOGRAM;  Surgeon: Lorretta Harp, MD;  Location: [facility] CATH LAB;  Service: Cardiovascular;  Laterality: N/A;   Family History  Problem Relation Age of Onset  . Heart failure Mother   . Heart disease  Mother   . Mitral valve prolapse Mother     rheumatic fever, MVReplacement  . Alzheimer's disease Father   . Breast cancer Sister     and sacrum cancer  . SIDS Daughter    Social History  Substance Use Topics  . Smoking status: Former Smoker    Types: Cigarettes    Quit date: 05/22/1988  . Smokeless tobacco: None  . Alcohol Use: No    Review of Systems  Gastrointestinal: Negative for bowel incontinence.  Genitourinary: Negative for bladder incontinence.  Musculoskeletal: Positive for back pain.  Neurological: Negative for tingling, weakness and numbness.  All other systems reviewed and are negative.  Allergies  Keflex; Lisinopril; Penicillins; and Sulfa antibiotics  Home Medications   Prior to Admission medications   Medication Sig Start Date End Date Taking? Authorizing Provider  allopurinol (ZYLOPRIM) 300 MG tablet Take 300 mg by mouth daily.    Historical Provider, MD  aspirin 81 MG tablet Take 81 mg by mouth daily.    Historical Provider, MD  calcium carbonate (OS-CAL) 600 MG TABS tablet Take 600 mg by mouth daily.    Historical Provider, MD  furosemide (LASIX) 40 MG tablet Take 40 mg by mouth.    Historical Provider, MD  losartan (COZAAR) 50 MG tablet Take 1 tablet (50 mg total) by mouth daily. 11/25/13   Lorretta Harp, MD  metFORMIN (GLUCOPHAGE)  850 MG tablet Take 850 mg by mouth 3 (three) times daily.    Historical Provider, MD  nitroGLYCERIN (NITROSTAT) 0.4 MG SL tablet Place 1 tablet (0.4 mg total) under the tongue every 5 (five) minutes as needed for chest pain. 12/29/13   Isaiah Serge, NP  omeprazole (PRILOSEC) 20 MG capsule Take 1 capsule (20 mg total) by mouth 2 (two) times daily. Patient not taking: Reported on 03/26/2015 12/26/14   Tanna Furry, MD  oxyCODONE-acetaminophen (ROXICET) 5-325 MG tablet Take 1-2 tablets by mouth every 6 (six) hours as needed for moderate pain or severe pain. 06/04/15   Harvel Quale, MD  PARoxetine (PAXIL) 20 MG tablet Take 20 mg by  mouth daily.    Historical Provider, MD  potassium chloride (K-DUR) 10 MEQ tablet Take 10 mEq by mouth daily.    Historical Provider, MD  ropinirole (REQUIP) 5 MG tablet Take 5 mg by mouth at bedtime.    Historical Provider, MD   BP 160/82 mmHg  Pulse 75  Temp(Src) 98.3 F (36.8 C) (Oral)  Resp 20  Ht 5\' 11"  (1.803 m)  Wt 242 lb (109.77 kg)  BMI 33.77 kg/m2  SpO2 97% Physical Exam  Constitutional: He is oriented to person, place, and time. He appears well-developed and well-nourished. No distress.  HENT:  Head: Normocephalic and atraumatic.  Right Ear: External ear normal.  Left Ear: External ear normal.  Mouth/Throat: Oropharynx is clear and moist. No oropharyngeal exudate.  Eyes: EOM are normal. Pupils are equal, round, and reactive to light.  Neck: Normal range of motion. Neck supple.  Cardiovascular: Normal rate, regular rhythm and intact distal pulses.   Pulmonary/Chest: Effort normal and breath sounds normal. No respiratory distress. He has no wheezes. He has no rales.  Abdominal: Soft. He exhibits no distension. There is no tenderness.  Musculoskeletal: Normal range of motion. He exhibits no edema.  Neurological: He is alert and oriented to person, place, and time. He has normal strength. No sensory deficit.  Positive straight leg examination when left leg raised increased pain in right lower back and down the right leg. Normal plantar and dorsal flexion.  No saddle anesthesia.  Skin: Skin is warm and dry. No rash noted. He is not diaphoretic.  Psychiatric: He has a normal mood and affect. Judgment normal.  Nursing note and vitals reviewed.   ED Course  Procedures (including critical care time) DIAGNOSTIC STUDIES: Oxygen Saturation is 97% on RA,  normal by my interpretation.    COORDINATION OF CARE: 7:25 PM Discussed treatment plan which includes XR and pain management with pt at bedside and pt agreed to plan.  Labs Review Labs Reviewed - No data to display  Imaging  Review Dg Lumbar Spine Complete  06/04/2015  CLINICAL DATA:  Low back pain for 2 weeks. Pain radiating to RIGHT leg. EXAM: LUMBAR SPINE - COMPLETE 4+ VIEW COMPARISON:  08/26/2013 FINDINGS: Multiple levels of endplate spurring unchanged. No loss vertebral body height and disc height. No subluxation. No pars fracture. IMPRESSION: 1. No acute findings lumbar spine. 2. Multilevel disc osteophytic disease. Electronically Signed   By: Suzy Bouchard M.D.   On: 06/04/2015 20:05   I have personally reviewed and evaluated these images and lab results as part of my medical decision-making.   EKG Interpretation None      MDM  Patient was seen and evaluated in stable condition.  Neurovascularly intact.  Examination concerning for sciatica.  Xray without acute process.  Patient felt improved after  Dilaudid and dose of decadron.  He was informed that decadron could raise his blood sugar and to have this rechecked by his PCP.  He was discharged home in stable condition with instruction to follow up with his PCP and with prescriptions for Valium and Percocet.  Patient and daughter expressed understanding and agreement with plan of care. Final diagnoses:  Sciatica of right side    1. Back pain, sciatica  I personally performed the services described in this documentation, which was scribed in my presence. The recorded information has been reviewed and is accurate.   Harvel Quale, MD 06/05/15 2213

## 2015-12-30 ENCOUNTER — Emergency Department (HOSPITAL_BASED_OUTPATIENT_CLINIC_OR_DEPARTMENT_OTHER)
Admission: EM | Admit: 2015-12-30 | Discharge: 2015-12-30 | Disposition: A | Discharge status: Home / Self Care | Payer: Medicare PPO | Attending: Emergency Medicine | Admitting: Emergency Medicine

## 2015-12-30 ENCOUNTER — Encounter (HOSPITAL_BASED_OUTPATIENT_CLINIC_OR_DEPARTMENT_OTHER): Payer: Self-pay | Admitting: *Deleted

## 2015-12-30 DIAGNOSIS — Y9389 Activity, other specified: Secondary | ICD-10-CM | POA: Insufficient documentation

## 2015-12-30 DIAGNOSIS — X58XXXA Exposure to other specified factors, initial encounter: Secondary | ICD-10-CM | POA: Diagnosis not present

## 2015-12-30 DIAGNOSIS — M549 Dorsalgia, unspecified: Secondary | ICD-10-CM | POA: Insufficient documentation

## 2015-12-30 DIAGNOSIS — Y990 Civilian activity done for income or pay: Secondary | ICD-10-CM | POA: Diagnosis not present

## 2015-12-30 DIAGNOSIS — Y929 Unspecified place or not applicable: Secondary | ICD-10-CM | POA: Insufficient documentation

## 2015-12-30 DIAGNOSIS — Z7984 Long term (current) use of oral hypoglycemic drugs: Secondary | ICD-10-CM | POA: Insufficient documentation

## 2015-12-30 DIAGNOSIS — I2510 Atherosclerotic heart disease of native coronary artery without angina pectoris: Secondary | ICD-10-CM | POA: Diagnosis not present

## 2015-12-30 DIAGNOSIS — Z7982 Long term (current) use of aspirin: Secondary | ICD-10-CM | POA: Diagnosis not present

## 2015-12-30 DIAGNOSIS — E119 Type 2 diabetes mellitus without complications: Secondary | ICD-10-CM | POA: Insufficient documentation

## 2015-12-30 DIAGNOSIS — Z87891 Personal history of nicotine dependence: Secondary | ICD-10-CM | POA: Diagnosis not present

## 2015-12-30 DIAGNOSIS — R21 Rash and other nonspecific skin eruption: Secondary | ICD-10-CM | POA: Diagnosis not present

## 2015-12-30 DIAGNOSIS — Z79899 Other long term (current) drug therapy: Secondary | ICD-10-CM | POA: Diagnosis not present

## 2015-12-30 DIAGNOSIS — B372 Candidiasis of skin and nail: Secondary | ICD-10-CM

## 2015-12-30 DIAGNOSIS — M545 Low back pain, unspecified: Secondary | ICD-10-CM

## 2015-12-30 DIAGNOSIS — I110 Hypertensive heart disease with heart failure: Secondary | ICD-10-CM | POA: Diagnosis not present

## 2015-12-30 DIAGNOSIS — I509 Heart failure, unspecified: Secondary | ICD-10-CM | POA: Diagnosis not present

## 2015-12-30 MED ORDER — NYSTATIN 100000 UNIT/GM EX CREA: TOPICAL_CREAM | CUTANEOUS | 0 refills | Status: DC

## 2015-12-30 MED ORDER — METHOCARBAMOL 500 MG PO TABS: 500.0000 mg | ORAL_TABLET | Freq: Three times a day (TID) | ORAL | 0 refills | Status: DC | PRN

## 2015-12-30 MED FILL — METHOCARBAMOL 500 MG TABLET: 500 | 4 days supply | Qty: 20 | Fill #0

## 2015-12-30 MED FILL — NYSTATIN 100,000 UNIT/GM CR: 100000 | 15 days supply | Qty: 30 | Fill #0

## 2015-12-30 NOTE — ED Provider Notes (Signed)
Medical screening examination/treatment/procedure(s) were conducted as a shared visit with non-physician practitioner(s) and myself.  I personally evaluated the patient during the encounter.   EKG Interpretation None      Patient seen by me along with the physician assistant. Patient is a 70 year old male that was doing a lot of heavy lifting on Monday and is here today complaining of bilateral low back pain. No neuro focal deficits. Exam no sniffing tenderness to palpation good cap refill to his big toes bilaterally. Sensation intact good range of motion of the toes and foot and able to even do straight leg raises on both sides. Patient stable for discharge home recommend symptomatic treatment.   Fredia Sorrow, MD 12/30/15 [phone]

## 2015-12-30 NOTE — ED Triage Notes (Signed)
Lower back pain x 3 days. States the pain started after lifting heavy items.

## 2015-12-30 NOTE — Discharge Instructions (Signed)
Read the information below.  Use the prescribed medication as directed.  Please discuss all new medications with your pharmacist.  You may return to the Emergency Department at any time for worsening condition or any new symptoms that concern you.    If you develop fevers, loss of control of bowel or bladder, weakness or numbness in your legs, or are unable to walk, return to the ER for a recheck.  °

## 2015-12-30 NOTE — ED Notes (Signed)
Pt speaking with employment supervisor via telephone at this time -- states he doesn't need a drug and/or alcohol screen.

## 2015-12-30 NOTE — ED Provider Notes (Signed)
[facility] [place] DEPT [facility] Provider Note   CSN: [id number] Arrival date & time: 12/30/15  1125  First Provider Contact:  First MD Initiated Contact with Patient 12/30/15 1221        History   Chief Complaint Chief Complaint  Patient presents with  . Back Injury    HPI Aaron Maldonado is a 70 y.o. male.  The history is provided by the patient.     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  Was very sore after his long day of work.  Now has soreness that is constant with occasional sharp pains with turning in certain directions.  Has also developed bilateral groin rash from wearing depends while working and sweating a lot (wears depends due to being on lasix and occasionally getting stuck in traffic and unable to get to the bathroom in time).  Has seen his primary care provider at the [place] 4 days ago with increase in his gabapentin for restless leg and a few other changes he is not able to remember currently.  Denies fevers, abdominal pain, urinary or bowel symptoms, weakness or numbness of the legs.   Past Medical History:  Diagnosis Date  . Abnormal nuclear stress test 12/29/2013  . Arthritis   . Chest pain   . CHF (congestive heart failure) ([place])   . Coronary artery disease   . Diabetes mellitus without complication ([place])   . Dyspnea on exertion   . History of heart surgery   . Hypertension     Patient Active Problem List   Diagnosis Date Noted  . Abnormal nuclear stress test 12/29/2013  . NSVT (nonsustained ventricular tachycardia) ([facility] [place]) 12/29/2013  . Essential hypertension 11/25/2013  . Hyperlipidemia 11/25/2013  . Diabetes ([place]) 11/25/2013  . Chest pain 11/25/2013  . Dyspnea on exertion 11/25/2013    Past Surgical History:  Procedure Laterality Date  . KNEE SURGERY    . LEFT HEART CATHETERIZATION WITH CORONARY ANGIOGRAM N/A 01/06/2014   Procedure:  LEFT HEART CATHETERIZATION WITH CORONARY ANGIOGRAM;  Surgeon: Lorretta Harp, MD;  Location: [facility] CATH LAB;  Service: Cardiovascular;  Laterality: N/A;  . SHOULDER SURGERY         Home Medications    Prior to Admission medications   Medication Sig Start Date End Date Taking? Authorizing Provider  allopurinol (ZYLOPRIM) 300 MG tablet Take 300 mg by mouth daily.   Yes Historical Provider, MD  aspirin 81 MG tablet Take 81 mg by mouth daily.   Yes Historical Provider, MD  calcium carbonate (OS-CAL) 600 MG TABS tablet Take 600 mg by mouth daily.   Yes Historical Provider, MD  furosemide (LASIX) 40 MG tablet Take 40 mg by mouth.   Yes Historical Provider, MD  losartan (COZAAR) 50 MG tablet Take 1 tablet (50 mg total) by mouth daily. 11/25/13  Yes Lorretta Harp, MD  metFORMIN (GLUCOPHAGE) 850 MG tablet Take 850 mg by mouth 3 (three) times daily.   Yes Historical Provider, MD  nitroGLYCERIN (NITROSTAT) 0.4 MG SL tablet Place 1 tablet (0.4 mg total) under the tongue every 5 (five) minutes as needed for chest pain. 12/29/13  Yes Isaiah Serge, NP  omeprazole (PRILOSEC) 20 MG capsule Take 1 capsule (20 mg total) by mouth 2 (two) times daily. 12/26/14  Yes Tanna Furry, MD  PARoxetine (PAXIL) 20 MG tablet Take 20 mg by mouth  daily.   Yes Historical Provider, MD  potassium chloride (K-DUR) 10 MEQ tablet Take 10 mEq by mouth daily.   Yes Historical Provider, MD  ropinirole (REQUIP) 5 MG tablet Take 5 mg by mouth at bedtime.   Yes Historical Provider, MD  methocarbamol (ROBAXIN) 500 MG tablet Take 1-2 tablets (500-1,000 mg total) by mouth every 8 (eight) hours as needed for muscle spasms (and back pain). 12/30/15   Clayton Bibles, PA-C  nystatin cream (MYCOSTATIN) Apply to affected area 2 times daily 12/30/15   Clayton Bibles, PA-C  oxyCODONE-acetaminophen (ROXICET) 5-325 MG tablet Take 1-2 tablets by mouth every 6 (six) hours as needed for moderate pain or severe pain. 06/04/15   Harvel Quale, MD    Family  History Family History  Problem Relation Age of Onset  . Heart failure Mother   . Heart disease Mother   . Mitral valve prolapse Mother     rheumatic fever, MVReplacement  . Alzheimer's disease Father   . Breast cancer Sister     and sacrum cancer  . SIDS Daughter     Social History Social History  Substance Use Topics  . Smoking status: Former Smoker    Types: Cigarettes    Quit date: 05/22/1988  . Smokeless tobacco: Never Used  . Alcohol use No     Allergies   Keflex [cephalexin]; Lisinopril; Penicillins; and Sulfa antibiotics   Review of Systems Review of Systems  Constitutional: Negative for activity change, appetite change and fever.  Gastrointestinal: Negative for abdominal pain, constipation, diarrhea and vomiting.  Genitourinary: Negative for dysuria, penile pain, penile swelling, scrotal swelling, testicular pain and urgency.  Musculoskeletal: Positive for back pain.  Skin: Positive for rash.  Allergic/Immunologic: Negative for immunocompromised state.  Neurological: Negative for weakness and numbness.  Hematological: Does not bruise/bleed easily.  Psychiatric/Behavioral: Negative for self-injury.     Physical Exam Updated Vital Signs BP 121/59 (BP Location: Right Arm)   Pulse 70   Temp 98.5 F (36.9 C) (Oral)   Resp 20   Ht 5\' 11"  (1.803 m)   Wt 110.2 kg   SpO2 96%   BMI 33.89 kg/m   Physical Exam  Constitutional: He appears well-developed and well-nourished. No distress.  HENT:  Head: Normocephalic and atraumatic.  Neck: Neck supple.  Pulmonary/Chest: Effort normal.  Abdominal: Soft. He exhibits no distension. There is no tenderness. There is no rebound and no guarding.  Musculoskeletal:  Spine nontender, no crepitus, or stepoffs. Lower extremities:  Strength 5/5, sensation intact, distal pulses intact.     Neurological: He is alert.  Skin: He is not diaphoretic.  Beefy erythema over suprapubic area that is nontender.  Scrotum with thickened  dry skin.  No tenderness.  No testicular tenderness.    Nursing note and vitals reviewed.    ED Treatments / Results  Labs (all labs ordered are listed, but only abnormal results are displayed) Labs Reviewed - No data to display  EKG  EKG Interpretation None       Radiology No results found.  Procedures Procedures (including critical care time)  Medications Ordered in ED Medications - No data to display   Initial Impression / Assessment and Plan / ED Course  I have reviewed the triage vital signs and the nursing notes.  Pertinent labs & imaging results that were available during my care of the patient were reviewed by me and considered in my medical decision making (see chart for details).  Clinical Course    Afebrile,  nontoxic patient with mechanical low back pain. No red flags.  Also with pruritic groin rash that appears to be related to moisture from adult diaper.  Pt also seen by Dr Rogene Houston.  D/C home with robaxin, nystatin cream, decreased heavy lifting at work, PCP follow up.  Discussed result, findings, treatment, and follow up  with patient.  Pt given return precautions.  Pt verbalizes understanding and agrees with plan.      Final Clinical Impressions(s) / ED Diagnoses   Final diagnoses:  Bilateral low back pain without sciatica  Yeast infection of the skin    New Prescriptions Discharge Medication List as of 12/30/2015  1:12 PM    START taking these medications   Details  methocarbamol (ROBAXIN) 500 MG tablet Take 1-2 tablets (500-1,000 mg total) by mouth every 8 (eight) hours as needed for muscle spasms (and back pain)., Starting Thu 12/30/2015, Print    nystatin cream (MYCOSTATIN) Apply to affected area 2 times daily, Print         [place], PA-C 12/30/15 1558

## 2016-04-06 ENCOUNTER — Encounter: Payer: Self-pay | Admitting: Cardiovascular Disease

## 2016-04-06 ENCOUNTER — Ambulatory Visit (INDEPENDENT_AMBULATORY_CARE_PROVIDER_SITE_OTHER): Payer: Medicare PPO | Admitting: Cardiovascular Disease

## 2016-04-06 VITALS — BP 167/87 | HR 66 | Ht 71.0 in | Wt 253.8 lb

## 2016-04-06 DIAGNOSIS — R9439 Abnormal result of other cardiovascular function study: Secondary | ICD-10-CM | POA: Diagnosis not present

## 2016-04-06 DIAGNOSIS — I10 Essential (primary) hypertension: Secondary | ICD-10-CM

## 2016-04-06 DIAGNOSIS — E7800 Pure hypercholesterolemia, unspecified: Secondary | ICD-10-CM | POA: Diagnosis not present

## 2016-04-06 NOTE — Assessment & Plan Note (Signed)
History of hyperlipidemia not on statin therapy followed by the [facility] in [place]

## 2016-04-06 NOTE — Progress Notes (Signed)
04/06/2016 Aaron Maldonado   22-Mar-1946  [id number]  Primary Physician Wonda Cerise, MD Primary Cardiologist: Lorretta Harp MD Renae Gloss  HPI:  Aaron Maldonado is a 70 year old moderately overweight divorced Caucasian male father of 4 living children (one deceased) and great grandfather to 6 grandchildren who I last saw in the office 11/25/13. He is a retired Building surveyor. His primary care is provided by the [facility] in [place]. He was referred by the ER at [facility] for evaluation of chest pain. His cardiac risk factor profile is remarkable for 60 pack years of tobacco abuse having quit back in 1990. He has treated hypertension, diabetes or hyperlipidemia. There is no family history of heart disease. He's never had a heart attack or stroke. 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 He was complaining of chest pain and shortness of breath when I saw him 2 years ago which led to a Myoview stress test 12/11/13 that is high risk with apical scar and peri-infarct ischemia. His EF likewise was reduced by 2-D echo in the 40-45% range with an elevated apical and anterolateral as well as septal hypokinesia. This led to cardiac catheterization performed 01/06/14 by myself revealing noncritical CAD with normal LV function. In retrospect, he attributes the chest pain to falling in his bathtub several weeks prior to seeing me at that time. He's had no recurrent symptoms.   Current Outpatient Prescriptions  Medication Sig Dispense Refill  . allopurinol (ZYLOPRIM) 300 MG tablet Take 300 mg by mouth daily.    Marland Kitchen aspirin 81 MG tablet Take 81 mg by mouth daily.    . calcium carbonate (OS-CAL) 600 MG TABS tablet Take 600 mg by mouth daily.    . furosemide (LASIX) 40 MG tablet Take 40 mg by mouth.    . losartan (COZAAR) 50 MG tablet Take 1 tablet (50 mg total) by mouth daily. 30 tablet 6  . metFORMIN (GLUCOPHAGE) 850 MG  tablet Take 850 mg by mouth 3 (three) times daily.    . nitroGLYCERIN (NITROSTAT) 0.4 MG SL tablet Place 1 tablet (0.4 mg total) under the tongue every 5 (five) minutes as needed for chest pain. 90 tablet 3  . nystatin cream (MYCOSTATIN) Apply to affected area 2 times daily 30 g 0  . omeprazole (PRILOSEC) 20 MG capsule Take 1 capsule (20 mg total) by mouth 2 (two) times daily. 60 capsule 0  . oxyCODONE-acetaminophen (ROXICET) 5-325 MG tablet Take 1-2 tablets by mouth every 6 (six) hours as needed for moderate pain or severe pain. 15 tablet 0  . potassium chloride (K-DUR) 10 MEQ tablet Take 10 mEq by mouth daily.    . ropinirole (REQUIP) 5 MG tablet Take 5 mg by mouth at bedtime.     No current facility-administered medications for this visit.     Allergies  Allergen Reactions  . Keflex [Cephalexin]     unknown  . Lisinopril Cough  . Penicillins Rash    rash  . Sulfa Antibiotics Rash    rash    Social History   Social History  . Marital status: Divorced    Spouse name: N/A  . Number of children: 3  . Years of education: N/A   Occupational History  . retired     former Administrator   Social History Main Topics  . Smoking status: Former Smoker    Types: Cigarettes    Quit  date: 05/22/1988  . Smokeless tobacco: Never Used  . Alcohol use No  . Drug use: No  . Sexual activity: Not on file   Other Topics Concern  . Not on file   Social History Narrative  . No narrative on file     Review of Systems: General: negative for chills, fever, night sweats or weight changes.  Cardiovascular: negative for chest pain, dyspnea on exertion, edema, orthopnea, palpitations, paroxysmal nocturnal dyspnea or shortness of breath Dermatological: negative for rash Respiratory: negative for cough or wheezing Urologic: negative for hematuria Abdominal: negative for nausea, vomiting, diarrhea, bright red blood per rectum, melena, or hematemesis Neurologic: negative for visual changes,  syncope, or dizziness All other systems reviewed and are otherwise negative except as noted above.    Blood pressure (!) 167/87, pulse 66, height 5\' 11"  (1.803 m), weight 253 lb 12.8 oz (115.1 kg), SpO2 97 %.  General appearance: alert and no distress Neck: no adenopathy, no carotid bruit, no JVD, supple, symmetrical, trachea midline and thyroid not enlarged, symmetric, no tenderness/mass/nodules Lungs: clear to auscultation bilaterally Heart: regular rate and rhythm, S1, S2 normal, no murmur, click, rub or gallop Extremities: extremities normal, atraumatic, no cyanosis or edema  EKG normal sinus rhythm at 66 with occasional PVC and rightward axis. I personally reviewed this EKG.  ASSESSMENT AND PLAN:   Essential hypertension History of hypertension with blood pressure measured 167/87. He said he checked his blood pressure yesterday at home which was 136/59. He is on losartan 50 mg a day. Continue current meds at current dosing  Hyperlipidemia History of hyperlipidemia not on statin therapy followed by the [facility] in [place]  Abnormal nuclear stress test Aaron Maldonado had an abnormal Myoview stress test as well as 2-D echocardiogram 12/11/13 suggesting apical scar with peri-infarct ischemia. Based on this I performed cardiac catheterization on him 01/06/14 revealing noncritical CAD with normal LV function. In retrospect, he says his chest pain was probably related to falling in his bathtub several weeks prior to being seen at that time with chest trauma. He's had no recurrent symptoms.      Lorretta Harp MD FACP,FACC,FAHA, [facility] 04/06/2016 11:16 AM

## 2016-04-06 NOTE — Assessment & Plan Note (Signed)
History of hypertension with blood pressure measured 167/87. He said he checked his blood pressure yesterday at home which was 136/59. He is on losartan 50 mg a day. Continue current meds at current dosing

## 2016-04-06 NOTE — Patient Instructions (Signed)
Medication Instructions: Your physician recommends that you continue on your current medications as directed. Please refer to the Current Medication list given to you today.   Labwork: I will request lab work from the [place] in [place].   Follow-Up: Your physician wants you to follow-up in: 12 months with Dr. Gwenlyn Found. You will receive a reminder letter in the mail two months in advance. If you don't receive a letter, please call our office to schedule the follow-up appointment.  If you need a refill on your cardiac medications before your next appointment, please call your pharmacy.

## 2016-04-06 NOTE — Assessment & Plan Note (Signed)
Aaron Maldonado had an abnormal Myoview stress test as well as 2-D echocardiogram 12/11/13 suggesting apical scar with peri-infarct ischemia. Based on this I performed cardiac catheterization on him 01/06/14 revealing noncritical CAD with normal LV function. In retrospect, he says his chest pain was probably related to falling in his bathtub several weeks prior to being seen at that time with chest trauma. He's had no recurrent symptoms.

## 2016-11-01 ENCOUNTER — Emergency Department (HOSPITAL_COMMUNITY): Payer: Medicare PPO

## 2016-11-01 ENCOUNTER — Emergency Department (HOSPITAL_COMMUNITY)
Admission: EM | Admit: 2016-11-01 | Discharge: 2016-11-01 | Disposition: A | Discharge status: Home / Self Care | Payer: Medicare PPO | Attending: Emergency Medicine | Admitting: Emergency Medicine

## 2016-11-01 ENCOUNTER — Encounter (HOSPITAL_COMMUNITY): Payer: Self-pay | Admitting: Emergency Medicine

## 2016-11-01 DIAGNOSIS — Z79899 Other long term (current) drug therapy: Secondary | ICD-10-CM | POA: Diagnosis not present

## 2016-11-01 DIAGNOSIS — Z7984 Long term (current) use of oral hypoglycemic drugs: Secondary | ICD-10-CM | POA: Insufficient documentation

## 2016-11-01 DIAGNOSIS — Z7982 Long term (current) use of aspirin: Secondary | ICD-10-CM | POA: Insufficient documentation

## 2016-11-01 DIAGNOSIS — I110 Hypertensive heart disease with heart failure: Secondary | ICD-10-CM | POA: Insufficient documentation

## 2016-11-01 DIAGNOSIS — E119 Type 2 diabetes mellitus without complications: Secondary | ICD-10-CM | POA: Insufficient documentation

## 2016-11-01 DIAGNOSIS — I509 Heart failure, unspecified: Secondary | ICD-10-CM | POA: Insufficient documentation

## 2016-11-01 DIAGNOSIS — I10 Essential (primary) hypertension: Secondary | ICD-10-CM

## 2016-11-01 DIAGNOSIS — Z87891 Personal history of nicotine dependence: Secondary | ICD-10-CM | POA: Diagnosis not present

## 2016-11-01 DIAGNOSIS — R0602 Shortness of breath: Secondary | ICD-10-CM | POA: Insufficient documentation

## 2016-11-01 LAB — CBC WITH DIFFERENTIAL/PLATELET
BASOS PCT: 0 %
Basophils Absolute: 0 10*3/uL (ref 0.0–0.1)
EOS ABS: 0.1 10*3/uL (ref 0.0–0.7)
EOS PCT: 1 %
HCT: 42.2 % (ref 39.0–52.0)
Hemoglobin: 15 g/dL (ref 13.0–17.0)
LYMPHS ABS: 3 10*3/uL (ref 0.7–4.0)
Lymphocytes Relative: 31 %
MCH: 32.9 pg (ref 26.0–34.0)
MCHC: 35.5 g/dL (ref 30.0–36.0)
MCV: 92.5 fL (ref 78.0–100.0)
MONOS PCT: 5 %
Monocytes Absolute: 0.5 10*3/uL (ref 0.1–1.0)
Neutro Abs: 6.2 10*3/uL (ref 1.7–7.7)
Neutrophils Relative %: 63 %
PLATELETS: 158 10*3/uL (ref 150–400)
RBC: 4.56 MIL/uL (ref 4.22–5.81)
RDW: 12.8 % (ref 11.5–15.5)
WBC: 9.9 10*3/uL (ref 4.0–10.5)

## 2016-11-01 LAB — BASIC METABOLIC PANEL
Anion gap: 7 (ref 5–15)
BUN: 16 mg/dL (ref 6–20)
CALCIUM: 9.8 mg/dL (ref 8.9–10.3)
CHLORIDE: 102 mmol/L (ref 101–111)
CO2: 27 mmol/L (ref 22–32)
CREATININE: 0.88 mg/dL (ref 0.61–1.24)
Glucose, Bld: 217 mg/dL — ABNORMAL HIGH (ref 65–99)
Potassium: 4 mmol/L (ref 3.5–5.1)
SODIUM: 136 mmol/L (ref 135–145)

## 2016-11-01 LAB — BRAIN NATRIURETIC PEPTIDE: B NATRIURETIC PEPTIDE 5: 79.1 pg/mL (ref 0.0–100.0)

## 2016-11-01 LAB — TROPONIN I: Troponin I: <0.03 ng/mL (ref <0.03)

## 2016-11-01 NOTE — ED Notes (Addendum)
Pt denies chest pain or any discomfort at this time. MD given new EKG due to PVCs and differences being noted on the monitor.

## 2016-11-01 NOTE — ED Notes (Signed)
MD at bedside. 

## 2016-11-01 NOTE — ED Provider Notes (Signed)
[name]   CSN: [id number] Arrival date & time: 11/01/16  1802     History   Chief Complaint Chief Complaint  Patient presents with  . Hypertension    HPI Aaron Maldonado is a 71 y.o. male.  71 year old male presents complaining of increased blood pressure. Has been noncompliant with his losartan. Went to urgent care prior to arrival here and those records were reviewed. Patient had elevated BNP of 553 as well as a CK index of 4.4. He had normal renal function as well as normal troponin there. Patient denies any dyspnea or cough. Denies any substernal chest pain. Patient's blood pressure at home have been elevated up to 220 and took his losartan and since then it has improved. Denies any headache or mental status changes. Was called by his doctor told to come here for further management      Past Medical History:  Diagnosis Date  . Abnormal nuclear stress test 12/29/2013  . Arthritis   . Chest pain   . CHF (congestive heart failure) ([place])   . Coronary artery disease   . Diabetes mellitus without complication ([place])   . Dyspnea on exertion   . History of heart surgery   . Hypertension     Patient Active Problem List   Diagnosis Date Noted  . Abnormal nuclear stress test 12/29/2013  . NSVT (nonsustained ventricular tachycardia) ([place]) 12/29/2013  . Essential hypertension 11/25/2013  . Hyperlipidemia 11/25/2013  . Diabetes ([place]) 11/25/2013  . Chest pain 11/25/2013  . Dyspnea on exertion 11/25/2013    Past Surgical History:  Procedure Laterality Date  . KNEE SURGERY    . LEFT HEART CATHETERIZATION WITH CORONARY ANGIOGRAM N/A 01/06/2014   Procedure: LEFT HEART CATHETERIZATION WITH CORONARY ANGIOGRAM;  Surgeon: Lorretta Harp, MD;  Location: [facility] CATH LAB;  Service: Cardiovascular;  Laterality: N/A;  . SHOULDER SURGERY         Home Medications    Prior to Admission medications   Medication Sig Start Date End Date Taking? Authorizing  Provider  allopurinol (ZYLOPRIM) 300 MG tablet Take 300 mg by mouth daily.   Yes [provider]  aspirin 81 MG tablet Take 81 mg by mouth daily.   Yes [provider]  calcium carbonate (OS-CAL) 600 MG TABS tablet Take 600 mg by mouth daily.   Yes [provider]  cetirizine (ZYRTEC) 10 MG tablet Take 10 mg by mouth daily.   Yes [provider]  furosemide (LASIX) 40 MG tablet Take 40 mg by mouth.   Yes [provider]  gabapentin (NEURONTIN) 300 MG capsule Take 600 mg by mouth at bedtime.   Yes [provider]  losartan (COZAAR) 50 MG tablet Take 1 tablet (50 mg total) by mouth daily. 11/25/13  Yes Lorretta Harp, MD  metFORMIN (GLUCOPHAGE) 1000 MG tablet Take 1,000 mg by mouth 2 (two) times daily with a meal.   Yes [provider]  nitroGLYCERIN (NITROSTAT) 0.4 MG SL tablet Place 1 tablet (0.4 mg total) under the tongue every 5 (five) minutes as needed for chest pain. 12/29/13  Yes Isaiah Serge, NP  nystatin cream (MYCOSTATIN) Apply to affected area 2 times daily 12/30/15  Yes [place], Emily, [place]  omeprazole (PRILOSEC) 20 MG capsule Take 1 capsule (20 mg total) by mouth 2 (two) times daily. 12/26/14  Yes Tanna Furry, MD  potassium chloride (KLOR-CON) 8 MEQ tablet Take 8 mEq by mouth daily.   Yes [provider]  rOPINIRole (  REQUIP) 3 MG tablet Take 3 mg by mouth at bedtime.   Yes [provider]  oxyCODONE-acetaminophen (ROXICET) 5-325 MG tablet Take 1-2 tablets by mouth every 6 (six) hours as needed for moderate pain or severe pain. 06/04/15   Harvel Quale, MD    Family History Family History  Problem Relation Age of Onset  . Heart failure Mother   . Heart disease Mother   . Mitral valve prolapse Mother        rheumatic fever, MVReplacement  . Alzheimer's disease Father   . Breast cancer Sister        and sacrum cancer  . SIDS Daughter     Social History Social History  Substance Use Topics  .  Smoking status: Former Smoker    Types: Cigarettes    Quit date: 05/22/1988  . Smokeless tobacco: Never Used  . Alcohol use No     Allergies   Keflex [cephalexin]; Lisinopril; Penicillins; Sulfa antibiotics; and Sulfasalazine   Review of Systems Review of Systems  All other systems reviewed and are negative.    Physical Exam Updated Vital Signs BP (!) 170/83 (BP Location: Left Arm)   Pulse 69   Temp 98.3 F (36.8 C) (Oral)   Resp 16   Wt 109.3 kg (241 lb)   SpO2 97%   BMI 33.61 kg/m   Physical Exam  Constitutional: He is oriented to person, place, and time. He appears well-developed and well-nourished.  Non-toxic appearance. No distress.  HENT:  Head: Normocephalic and atraumatic.  Eyes: Conjunctivae, EOM and lids are normal. Pupils are equal, round, and reactive to light.  Neck: Normal range of motion. Neck supple. No tracheal deviation present. No thyroid mass present.  Cardiovascular: Normal rate, regular rhythm and normal heart sounds.  Exam reveals no gallop.   No murmur heard. Pulmonary/Chest: Effort normal and breath sounds normal. No stridor. No respiratory distress. He has no decreased breath sounds. He has no wheezes. He has no rhonchi. He has no rales.  Abdominal: Soft. Normal appearance and bowel sounds are normal. He exhibits no distension. There is no tenderness. There is no rebound and no CVA tenderness.  Musculoskeletal: Normal range of motion. He exhibits no edema or tenderness.  Neurological: He is alert and oriented to person, place, and time. He has normal strength. No cranial nerve deficit or sensory deficit. GCS eye subscore is 4. GCS verbal subscore is 5. GCS motor subscore is 6.  Skin: Skin is warm and dry. No abrasion and no rash noted.  Psychiatric: He has a normal mood and affect. His speech is normal and behavior is normal.  Nursing note and vitals reviewed.    ED Treatments / Results  Labs (all labs ordered are listed, but only abnormal  results are displayed) Labs Reviewed  BASIC METABOLIC PANEL - Abnormal; Notable for the following:       Result Value   Glucose, Bld 217 (*)    All other components within normal limits  CBC WITH DIFFERENTIAL/PLATELET  BRAIN NATRIURETIC PEPTIDE  TROPONIN I    EKG  EKG Interpretation  Date/Time:  Wednesday November 01 2016 22:22:44 EDT Ventricular Rate:  98 PR Interval:    QRS Duration: 94 QT Interval:  364 QTC Calculation: 388 R Axis:   123 Text Interpretation:  Sinus rhythm Paired ventricular premature complexes Low voltage, precordial leads Probable right ventricular hypertrophy Nonspecific T abnormalities, diffuse leads No significant change since last tracing Confirmed by Earlie Arciga  MD, Shana Younge ([id number]) on  11/01/2016 10:33:15 PM       Radiology No results found.  Procedures Procedures (including critical care time)  Medications Ordered in ED Medications - No data to display   Initial Impression / Assessment and Plan / ED Course  I have reviewed the triage vital signs and the nursing notes.  Pertinent labs & imaging results that were available during my care of the patient were reviewed by me and considered in my medical decision making (see chart for details).     Patient is repeat BnP here normal. Chest x-ray is normal. Blood pressure has been stable here. Instructed to take his losartan as directed and follow-up with his doctor.  Final Clinical Impressions(s) / ED Diagnoses   Final diagnoses:  SOB (shortness of breath)    New Prescriptions New Prescriptions   No medications on file     Lacretia Leigh, MD 11/01/16 2254

## 2016-11-01 NOTE — ED Triage Notes (Signed)
Per pt, states he woke up this am with a H/a-states he took BP on left wrist and systolic was over [id number] he went to UC and was assessed-states PA called him at home and told him to go to ED because his labs were abnormal

## 2016-11-27 ENCOUNTER — Emergency Department (HOSPITAL_COMMUNITY)
Admission: EM | Admit: 2016-11-27 | Discharge: 2016-11-27 | Disposition: A | Discharge status: Home / Self Care | Payer: Medicare PPO | Attending: Emergency Medicine | Admitting: Emergency Medicine

## 2016-11-27 ENCOUNTER — Encounter (HOSPITAL_COMMUNITY): Payer: Self-pay | Admitting: Emergency Medicine

## 2016-11-27 DIAGNOSIS — I493 Ventricular premature depolarization: Secondary | ICD-10-CM

## 2016-11-27 DIAGNOSIS — Z79899 Other long term (current) drug therapy: Secondary | ICD-10-CM | POA: Diagnosis not present

## 2016-11-27 DIAGNOSIS — Z87891 Personal history of nicotine dependence: Secondary | ICD-10-CM | POA: Insufficient documentation

## 2016-11-27 DIAGNOSIS — I130 Hypertensive heart and chronic kidney disease with heart failure and stage 1 through stage 4 chronic kidney disease, or unspecified chronic kidney disease: Secondary | ICD-10-CM | POA: Insufficient documentation

## 2016-11-27 DIAGNOSIS — D649 Anemia, unspecified: Secondary | ICD-10-CM

## 2016-11-27 DIAGNOSIS — I2510 Atherosclerotic heart disease of native coronary artery without angina pectoris: Secondary | ICD-10-CM | POA: Diagnosis not present

## 2016-11-27 DIAGNOSIS — D509 Iron deficiency anemia, unspecified: Secondary | ICD-10-CM | POA: Insufficient documentation

## 2016-11-27 DIAGNOSIS — N189 Chronic kidney disease, unspecified: Secondary | ICD-10-CM | POA: Diagnosis not present

## 2016-11-27 DIAGNOSIS — I509 Heart failure, unspecified: Secondary | ICD-10-CM | POA: Diagnosis not present

## 2016-11-27 DIAGNOSIS — Z7984 Long term (current) use of oral hypoglycemic drugs: Secondary | ICD-10-CM | POA: Insufficient documentation

## 2016-11-27 DIAGNOSIS — Z7982 Long term (current) use of aspirin: Secondary | ICD-10-CM | POA: Diagnosis not present

## 2016-11-27 DIAGNOSIS — N289 Disorder of kidney and ureter, unspecified: Secondary | ICD-10-CM

## 2016-11-27 DIAGNOSIS — R55 Syncope and collapse: Secondary | ICD-10-CM

## 2016-11-27 DIAGNOSIS — R531 Weakness: Secondary | ICD-10-CM | POA: Diagnosis present

## 2016-11-27 DIAGNOSIS — E1122 Type 2 diabetes mellitus with diabetic chronic kidney disease: Secondary | ICD-10-CM | POA: Insufficient documentation

## 2016-11-27 LAB — DIFFERENTIAL
Basophils Absolute: 0 10*3/uL (ref 0.0–0.1)
Basophils Relative: 0 %
EOS PCT: 1 %
Eosinophils Absolute: 0.1 10*3/uL (ref 0.0–0.7)
LYMPHS ABS: 2.3 10*3/uL (ref 0.7–4.0)
LYMPHS PCT: 27 %
MONO ABS: 0.5 10*3/uL (ref 0.1–1.0)
Monocytes Relative: 6 %
Neutro Abs: 5.6 10*3/uL (ref 1.7–7.7)
Neutrophils Relative %: 66 %

## 2016-11-27 LAB — URINALYSIS, ROUTINE W REFLEX MICROSCOPIC
BILIRUBIN URINE: NEGATIVE
GLUCOSE, UA: NEGATIVE mg/dL
HGB URINE DIPSTICK: NEGATIVE
Ketones, ur: NEGATIVE mg/dL
Leukocytes, UA: NEGATIVE
Nitrite: NEGATIVE
PH: 5 (ref 5.0–8.0)
Protein, ur: NEGATIVE mg/dL
SPECIFIC GRAVITY, URINE: 1.023 (ref 1.005–1.030)

## 2016-11-27 LAB — I-STAT TROPONIN, ED: TROPONIN I, POC: 0.01 ng/mL (ref 0.00–0.08)

## 2016-11-27 LAB — HEPATIC FUNCTION PANEL
ALK PHOS: 58 U/L (ref 38–126)
ALT: 19 U/L (ref 17–63)
AST: 19 U/L (ref 15–41)
Albumin: 3.4 g/dL — ABNORMAL LOW (ref 3.5–5.0)
BILIRUBIN INDIRECT: 1.3 mg/dL — AB (ref 0.3–0.9)
BILIRUBIN TOTAL: 1.5 mg/dL — AB (ref 0.3–1.2)
Bilirubin, Direct: 0.2 mg/dL (ref 0.1–0.5)
Total Protein: 5.6 g/dL — ABNORMAL LOW (ref 6.5–8.1)

## 2016-11-27 LAB — CBC
HEMATOCRIT: 37.9 % — AB (ref 39.0–52.0)
HEMOGLOBIN: 12.8 g/dL — AB (ref 13.0–17.0)
MCH: 32.7 pg (ref 26.0–34.0)
MCHC: 33.8 g/dL (ref 30.0–36.0)
MCV: 96.9 fL (ref 78.0–100.0)
Platelets: 129 10*3/uL — ABNORMAL LOW (ref 150–400)
RBC: 3.91 MIL/uL — ABNORMAL LOW (ref 4.22–5.81)
RDW: 13.1 % (ref 11.5–15.5)
WBC: 8.4 10*3/uL (ref 4.0–10.5)

## 2016-11-27 LAB — BASIC METABOLIC PANEL
ANION GAP: 5 (ref 5–15)
BUN: 18 mg/dL (ref 6–20)
CALCIUM: 9.3 mg/dL (ref 8.9–10.3)
CO2: 28 mmol/L (ref 22–32)
Chloride: 102 mmol/L (ref 101–111)
Creatinine, Ser: 1.37 mg/dL — ABNORMAL HIGH (ref 0.61–1.24)
GFR calc Af Amer: 58 mL/min — ABNORMAL LOW (ref >60)
GFR calc non Af Amer: 50 mL/min — ABNORMAL LOW (ref >60)
Glucose, Bld: 179 mg/dL — ABNORMAL HIGH (ref 65–99)
POTASSIUM: 4.2 mmol/L (ref 3.5–5.1)
Sodium: 135 mmol/L (ref 135–145)

## 2016-11-27 LAB — POC OCCULT BLOOD, ED: FECAL OCCULT BLD: NEGATIVE

## 2016-11-27 LAB — BRAIN NATRIURETIC PEPTIDE: B Natriuretic Peptide: 43.6 pg/mL (ref 0.0–100.0)

## 2016-11-27 LAB — MAGNESIUM: Magnesium: 1.9 mg/dL (ref 1.7–2.4)

## 2016-11-27 LAB — CBG MONITORING, ED: Glucose-Capillary: 177 mg/dL — ABNORMAL HIGH (ref 65–99)

## 2016-11-27 NOTE — ED Provider Notes (Signed)
[facility] DEPT Provider Note   CSN: [id number] Arrival date & time: 11/27/16  2094     History   Chief Complaint Chief Complaint  Patient presents with  . Weakness    HPI Aaron Maldonado is a 71 y.o. male.  The history is provided by the patient.  He has a history of CHF and coronary artery disease. Today, he states that he felt weak and dizzy. Dizziness is a lightheaded feeling. It was not affected by movement or body position. He had a near syncopal episode when he went to go to a vending machine. He denies chest pain, heaviness, tightness, pressure. He denies dyspnea, nausea, diaphoresis. EMS was called and did notice blood pressure dropped to as low as 60 systolic which improved after 500 mL bolus. Patient states he does feel better following this. He has been eating and drinking normally and denies any unusual sweating.  Past Medical History:  Diagnosis Date  . Abnormal nuclear stress test 12/29/2013  . Arthritis   . Chest pain   . CHF (congestive heart failure) ([place])   . Coronary artery disease   . Diabetes mellitus without complication ([place])   . Dyspnea on exertion   . History of heart surgery   . Hypertension     Patient Active Problem List   Diagnosis Date Noted  . Abnormal nuclear stress test 12/29/2013  . NSVT (nonsustained ventricular tachycardia) ([place]) 12/29/2013  . Essential hypertension 11/25/2013  . Hyperlipidemia 11/25/2013  . Diabetes ([place]) 11/25/2013  . Chest pain 11/25/2013  . Dyspnea on exertion 11/25/2013    Past Surgical History:  Procedure Laterality Date  . KNEE SURGERY    . LEFT HEART CATHETERIZATION WITH CORONARY ANGIOGRAM N/A 01/06/2014   Procedure: LEFT HEART CATHETERIZATION WITH CORONARY ANGIOGRAM;  Surgeon: Lorretta Harp, MD;  Location: [facility] CATH LAB;  Service: Cardiovascular;  Laterality: N/A;  . SHOULDER SURGERY         Home Medications    Prior to Admission medications   Medication Sig Start Date End Date Taking?  Authorizing Provider  allopurinol (ZYLOPRIM) 300 MG tablet Take 300 mg by mouth daily.    [provider]  aspirin 81 MG tablet Take 81 mg by mouth daily.    [provider]  calcium carbonate (OS-CAL) 600 MG TABS tablet Take 600 mg by mouth daily.    [provider]  cetirizine (ZYRTEC) 10 MG tablet Take 10 mg by mouth daily.    [provider]  furosemide (LASIX) 40 MG tablet Take 40 mg by mouth.    [provider]  gabapentin (NEURONTIN) 300 MG capsule Take 600 mg by mouth at bedtime.    [provider]  losartan (COZAAR) 50 MG tablet Take 1 tablet (50 mg total) by mouth daily. 11/25/13   Lorretta Harp, MD  metFORMIN (GLUCOPHAGE) 1000 MG tablet Take 1,000 mg by mouth 2 (two) times daily with a meal.    [provider]  nitroGLYCERIN (NITROSTAT) 0.4 MG SL tablet Place 1 tablet (0.4 mg total) under the tongue every 5 (five) minutes as needed for chest pain. 12/29/13   Isaiah Serge, NP  nystatin cream (MYCOSTATIN) Apply to affected area 2 times daily 12/30/15   West, Emily, PA-C  omeprazole (PRILOSEC) 20 MG capsule Take 1 capsule (20 mg total) by mouth 2 (two) times daily. 12/26/14   Tanna Furry, MD  oxyCODONE-acetaminophen (ROXICET) 5-325 MG tablet Take 1-2 tablets by mouth every 6 (six) hours as needed for moderate  pain or severe pain. 06/04/15   Harvel Quale, MD  potassium chloride (KLOR-CON) 8 MEQ tablet Take 8 mEq by mouth daily.    [provider]  rOPINIRole (REQUIP) 3 MG tablet Take 3 mg by mouth at bedtime.    [provider]    Family History Family History  Problem Relation Age of Onset  . Heart failure Mother   . Heart disease Mother   . Mitral valve prolapse Mother        rheumatic fever, MVReplacement  . Alzheimer's disease Father   . Breast cancer Sister        and sacrum cancer  . SIDS Daughter     Social History Social History  Substance Use Topics  . Smoking status: Former Smoker      Types: Cigarettes    Quit date: 05/22/1988  . Smokeless tobacco: Never Used  . Alcohol use No     Allergies   Keflex [cephalexin]; Lisinopril; Penicillins; Sulfa antibiotics; and Sulfasalazine   Review of Systems Review of Systems  All other systems reviewed and are negative.    Physical Exam Updated Vital Signs BP (!) 124/56 (BP Location: Right Arm)   Pulse 82   Temp 97.8 F (36.6 C) (Oral)   Resp 19   Ht 5\' 11"  (1.803 m)   Wt 113.9 kg (251 lb)   SpO2 96%   BMI 35.01 kg/m   Physical Exam  Nursing note and vitals reviewed.  71 year old male, resting comfortably and in no acute distress. Vital signs are normal. Oxygen saturation is 96%, which is normal. Head is normocephalic and atraumatic. PERRLA, EOMI. Oropharynx is clear. Neck is nontender and supple without adenopathy or JVD. Back is nontender and there is no CVA tenderness. Lungs are clear without rales, wheezes, or rhonchi. Chest is nontender. Heart has frequent premature beats without murmur. Abdomen is soft, flat, nontender without masses or hepatosplenomegaly and peristalsis is normoactive. Extremities have trace edema, full range of motion is present. Skin is warm and dry without rash. Neurologic: Mental status is normal, cranial nerves are intact, there are no motor or sensory deficits.  ED Treatments / Results  Labs (all labs ordered are listed, but only abnormal results are displayed) Labs Reviewed  BASIC METABOLIC PANEL - Abnormal; Notable for the following:       Result Value   Glucose, Bld 179 (*)    Creatinine, Ser 1.37 (*)    GFR calc non Af Amer 50 (*)    GFR calc Af Amer 58 (*)    All other components within normal limits  CBC - Abnormal; Notable for the following:    RBC 3.91 (*)    Hemoglobin 12.8 (*)    HCT 37.9 (*)    Platelets 129 (*)    All other components within normal limits  HEPATIC FUNCTION PANEL - Abnormal; Notable for the following:    Total Protein 5.6 (*)    Albumin  3.4 (*)    Total Bilirubin 1.5 (*)    Indirect Bilirubin 1.3 (*)    All other components within normal limits  CBG MONITORING, ED - Abnormal; Notable for the following:    Glucose-Capillary 177 (*)    All other components within normal limits  URINALYSIS, ROUTINE W REFLEX MICROSCOPIC  DIFFERENTIAL  BRAIN NATRIURETIC PEPTIDE  MAGNESIUM  I-STAT TROPOININ, ED  POC OCCULT BLOOD, ED    EKG  EKG Interpretation  Date/Time:  Monday November 27 2016 16:34:13 EDT Ventricular Rate:  87 PR Interval:    QRS Duration: 97 QT Interval:  364 QTC Calculation: 420 R Axis:   95 Text Interpretation:  Sinus rhythm Paired ventricular premature complexes Consider right ventricular hypertrophy Nonspecific T wave abnormality When compared with ECG of 11/01/2016, No significant change was found Confirmed by Delora Fuel ([id number]) on 11/27/2016 4:41:48 PM       Procedures Procedures (including critical care time)  Medications Ordered in ED Medications - No data to display   Initial Impression / Assessment and Plan / ED Course  I have reviewed the triage vital signs and the nursing notes.  Pertinent labs & imaging results that were available during my care of the patient were reviewed by me and considered in my medical decision making (see chart for details).  Episode of weakness and hypotension of uncertain cause. Blood pressure is normal now. Will check screening labs. ECG shows no acute changes. Review of past records shows prior ED visits for chest pain and outpatient echocardiogram for dyspnea which showed ejection fraction 40-45%, and grade 1 diastolic dysfunction. Myocardial perfusion study at the same time did show a partially reversible apical defect consistent with prior infarct and mild peri-infarct ischemia.  Screening labs show drop in hemoglobin of 2 g over the last 3 weeks. Hemoccult is negative. Orthostatic vital signs showed no significant change in pulse or blood pressure. There has been a  slight rise in creatinine, but it is in a range that he has been at before. No clear cause for his symptoms. He is referred back to PCP for follow-up, her strict return precautions discussed.  Final Clinical Impressions(s) / ED Diagnoses   Final diagnoses:  Weakness  Near syncope  Normochromic normocytic anemia  Renal insufficiency  Frequent PVCs    New Prescriptions New Prescriptions   No medications on file     Delora Fuel, MD 28/76/81 2104

## 2016-11-27 NOTE — ED Notes (Signed)
Showed provider EKG strip of run of VT.  MD aware.

## 2016-11-27 NOTE — ED Notes (Signed)
EKG given to Dr. Glick. 

## 2016-11-27 NOTE — ED Triage Notes (Signed)
Per EMS, Pt woke up feeling weak and at times stumbling around, he went to work today and felt worse when he got home, daughter called EMS.  Upon arival he was pale, clammy but denied SOB, CP, N/V.  Pt was not othostatic, initial bp wa ws100/60, in truck it dropped to 60/40, was given 500cc ns which brought his bp up to 112/58.  Pt was sinus w/ frequent PVC (HX).

## 2016-11-27 NOTE — Discharge Instructions (Signed)
Your evaluation today did not explain why you have been feeling so weak and dizzy. It did show a drop in your hemoglobin (the part of blood that carries oxygen), but the reason for this is not clear. There was no evidence of internal bleeding. Please follow up with your primary care physician, return to the emergency department if symptoms are getting worse.

## 2016-11-28 ENCOUNTER — Ambulatory Visit: Payer: Medicare PPO | Admitting: Cardiovascular Disease

## 2016-12-08 ENCOUNTER — Other Ambulatory Visit (INDEPENDENT_AMBULATORY_CARE_PROVIDER_SITE_OTHER): Payer: Medicare PPO

## 2016-12-08 ENCOUNTER — Encounter: Payer: Self-pay | Admitting: Family

## 2016-12-08 ENCOUNTER — Ambulatory Visit (INDEPENDENT_AMBULATORY_CARE_PROVIDER_SITE_OTHER): Payer: Medicare PPO | Admitting: Family

## 2016-12-08 VITALS — BP 118/68 | HR 56 | Temp 98.0°F | Resp 16 | Ht 71.0 in | Wt 247.0 lb

## 2016-12-08 DIAGNOSIS — E860 Dehydration: Secondary | ICD-10-CM

## 2016-12-08 DIAGNOSIS — E1142 Type 2 diabetes mellitus with diabetic polyneuropathy: Secondary | ICD-10-CM | POA: Diagnosis not present

## 2016-12-08 LAB — COMPREHENSIVE METABOLIC PANEL
ALK PHOS: 66 U/L (ref 39–117)
ALT: 16 U/L (ref 0–53)
AST: 15 U/L (ref 0–37)
Albumin: 3.9 g/dL (ref 3.5–5.2)
BUN: 14 mg/dL (ref 6–23)
CHLORIDE: 103 meq/L (ref 96–112)
CO2: 29 mEq/L (ref 19–32)
Calcium: 9.5 mg/dL (ref 8.4–10.5)
Creatinine, Ser: 1.15 mg/dL (ref 0.40–1.50)
GFR: 66.48 mL/min (ref >60.00)
GLUCOSE: 280 mg/dL — AB (ref 70–99)
POTASSIUM: 3.9 meq/L (ref 3.5–5.1)
Sodium: 139 mEq/L (ref 135–145)
TOTAL PROTEIN: 6.3 g/dL (ref 6.0–8.3)
Total Bilirubin: 1.1 mg/dL (ref 0.2–1.2)

## 2016-12-08 LAB — CBC
HCT: 42.4 % (ref 39.0–52.0)
HEMOGLOBIN: 14.4 g/dL (ref 13.0–17.0)
MCHC: 34 g/dL (ref 30.0–36.0)
MCV: 98.9 fl (ref 78.0–100.0)
Platelets: 151 10*3/uL (ref 150.0–400.0)
RBC: 4.28 Mil/uL (ref 4.22–5.81)
RDW: 13.1 % (ref 11.5–15.5)
WBC: 10.2 10*3/uL (ref 4.0–10.5)

## 2016-12-08 LAB — MICROALBUMIN / CREATININE URINE RATIO
Creatinine,U: 239.9 mg/dL
MICROALB UR: 7.1 mg/dL — AB (ref 0.0–1.9)
MICROALB/CREAT RATIO: 3 mg/g (ref 0.0–30.0)

## 2016-12-08 LAB — HEMOGLOBIN A1C: HEMOGLOBIN A1C: 7.4 % — AB (ref 4.6–6.5)

## 2016-12-08 NOTE — Progress Notes (Signed)
Subjective:    Patient ID: Aaron Maldonado, male    DOB: 06-25-1945, 71 y.o.   MRN: [id number]  Chief Complaint  Patient presents with  . Establish Care    hospital follow up    HPI:  Aaron Maldonado is a 71 y.o. male who  has a past medical history of Abnormal nuclear stress test (12/29/2013); Allergy; Arthritis; Chest pain; CHF (congestive heart failure) ([place]); Chicken pox; Colon polyps; Coronary artery disease; Depression; Diabetes mellitus without complication ([place]); Diverticulitis; Dyspnea on exertion; Heart murmur; History of heart surgery; Hyperlipidemia; Hypertension; and Stroke [facility]). and presents today for an office visit to establish care and follow up after ED visit.  1.) Weakness/Dizziness - This is a new problem. Recently evaluated in the emergency department with chief complaint of feeling weak and dizzy and describing the dizziness and lightheaded feeling. Not affected by movement or body position. He was brought into the emergency department by EMS with a low blood pressure of 60 systolic which improved after 500 mL bolus of fluids. EKG showed sinus rhythm with periventricular premature complexes with no significant changes when compared to previous EKG. Most recent echocardiogram shows ejection fraction of 40-45% with grade 1 diastolic dysfunction. Myocardial perfusion study previously performed showed partially reversible apical defect consistent with prior infarct. He was noted to have a hemoglobin drop of 2 g were the past 3 weeks with Hemoccult being negative. Orthostatic vitals were negative. He was discharged with recommended follow-up with primary care.   Since leaving the hospital he reports improvement in his symptoms with occasional dizzy spells. Frequency of the dizzy spells is occasional. Drinks a moderate amount of caffeine and water. Eats out occasionally. Salt intake is moderate. No further dizzy spells since leaving the ED. Believes it is aggravated by the  temperature. No current dizziness, chest pains, dyspnea on exertion or heart palpitations.   2.) Diabetes - Currently maintained on metformin. Reports taking the medication as prescribed and denies adverse side effects or hypoglycemic readings. Blood sugars at home have been on the lower side 120-140s. . Denies new symptoms of end organ damage. No excessive hunger, thirst, or urination. Working on a low/carbohydrate modified oral intake. Last eye exam was completed last year by the [place]. Pneumovax is up to date.   Lab Results  Component Value Date   HGBA1C 7.4 (H) 12/08/2016     Lab Results  Component Value Date   CREATININE 1.15 12/08/2016   BUN 14 12/08/2016   NA 139 12/08/2016   K 3.9 12/08/2016   CL 103 12/08/2016   CO2 29 12/08/2016       Allergies  Allergen Reactions  . Keflex [Cephalexin]     unknown  . Lisinopril Cough  . Penicillins Rash    Has patient had a PCN reaction causing immediate rash, facial/tongue/throat swelling, SOB or lightheadedness with hypotension: No Has patient had a PCN reaction causing severe rash involving mucus membranes or skin necrosis: No Has patient had a PCN reaction that required hospitalization: No Has patient had a PCN reaction occurring within the last 10 years: No If all of the above answers are "NO", then may proceed with Cephalosporin use.   . Sulfa Antibiotics Rash    rash  . Sulfasalazine Rash    rash      Outpatient Medications Prior to Visit  Medication Sig Dispense Refill  . allopurinol (ZYLOPRIM) 300 MG tablet Take 300 mg by mouth daily.    Marland Kitchen aspirin 81 MG tablet Take 81  mg by mouth daily.    . calcium carbonate (OS-CAL) 600 MG TABS tablet Take 600 mg by mouth daily.    . cetirizine (ZYRTEC) 10 MG tablet Take 10 mg by mouth daily.    . furosemide (LASIX) 40 MG tablet Take 40 mg by mouth.    . gabapentin (NEURONTIN) 300 MG capsule Take 600 mg by mouth at bedtime.    Marland Kitchen losartan (COZAAR) 50 MG tablet Take 1 tablet (50 mg  total) by mouth daily. (Patient taking differently: Take 25 mg by mouth daily. ) 30 tablet 6  . metFORMIN (GLUCOPHAGE) 1000 MG tablet Take 1,000 mg by mouth 2 (two) times daily with a meal.    . nitroGLYCERIN (NITROSTAT) 0.4 MG SL tablet Place 1 tablet (0.4 mg total) under the tongue every 5 (five) minutes as needed for chest pain. 90 tablet 3  . potassium chloride (KLOR-CON) 8 MEQ tablet Take 8 mEq by mouth daily.    Marland Kitchen rOPINIRole (REQUIP) 3 MG tablet Take 3 mg by mouth at bedtime.    Marland Kitchen ketotifen (ZADITOR) 0.025 % ophthalmic solution Place 2 drops into both eyes daily as needed.     No facility-administered medications prior to visit.      Past Medical History:  Diagnosis Date  . Abnormal nuclear stress test 12/29/2013  . Allergy   . Arthritis   . Chest pain   . CHF (congestive heart failure) ([place])   . Chicken pox   . Colon polyps   . Coronary artery disease   . Depression   . Diabetes mellitus without complication ([place])   . Diverticulitis   . Dyspnea on exertion   . Heart murmur   . History of heart surgery   . Hyperlipidemia   . Hypertension   . Stroke [facility])       Past Surgical History:  Procedure Laterality Date  . KNEE SURGERY    . LEFT HEART CATHETERIZATION WITH CORONARY ANGIOGRAM N/A 01/06/2014   Procedure: LEFT HEART CATHETERIZATION WITH CORONARY ANGIOGRAM;  Surgeon: Lorretta Harp, MD;  Location: [facility] CATH LAB;  Service: Cardiovascular;  Laterality: N/A;  . SHOULDER SURGERY    . TONSILLECTOMY        Family History  Problem Relation Age of Onset  . Heart failure Mother   . Heart disease Mother   . Mitral valve prolapse Mother        rheumatic fever, MVReplacement  . Alzheimer's disease Father   . Breast cancer Sister        and sacrum cancer  . SIDS Daughter   . Hyperlipidemia Maternal Grandmother   . Miscarriages / Stillbirths Maternal Grandmother   . Hypertension Maternal Grandmother   . Hyperlipidemia Maternal Grandfather   . Miscarriages /  Stillbirths Maternal Grandfather   . Hypertension Maternal Grandfather   . Hypertension Paternal Grandmother   . Hypertension Paternal Grandfather       Social History   Social History  . Marital status: Divorced    Spouse name: N/A  . Number of children: 3  . Years of education: 9   Occupational History  . retired     former Administrator   Social History Main Topics  . Smoking status: Former Smoker    Packs/day: 1.50    Years: 43.00    Types: Cigarettes    Quit date: 05/22/1988  . Smokeless tobacco: Never Used  . Alcohol use No  . Drug use: No  . Sexual activity: Not on file  Other Topics Concern  . Not on file   Social History Narrative   Fun/Hobby: Work    Denies abuse and feels safe at home.       Review of Systems  Eyes:       Negative for changes in vision.  Respiratory: Negative for chest tightness and shortness of breath.   Cardiovascular: Negative for chest pain, palpitations and leg swelling.  Endocrine: Negative for polydipsia, polyphagia and polyuria.  Neurological: Negative for dizziness, weakness, light-headedness and headaches.       Objective:    BP 118/68 (BP Location: Left Arm, Patient Position: Sitting, Cuff Size: Large)   Pulse (!) 56   Temp 98 F (36.7 C) (Oral)   Resp 16   Ht 5\' 11"  (1.803 m)   Wt 247 lb (112 kg)   SpO2 96%   BMI 34.45 kg/m  Nursing note and vital signs reviewed.  Physical Exam  Constitutional: He is oriented to person, place, and time. He appears well-developed and well-nourished. No distress.  HENT:  Right Ear: Hearing, tympanic membrane, external ear and ear canal normal.  Left Ear: Hearing, tympanic membrane, external ear and ear canal normal.  Nose: Nose normal.  Mouth/Throat: Uvula is midline, oropharynx is clear and moist and mucous membranes are normal.  Cardiovascular: Normal rate, regular rhythm and intact distal pulses.  Exam reveals no gallop and no friction rub.   No murmur  heard. Pulmonary/Chest: Effort normal and breath sounds normal. No respiratory distress. He has no wheezes. He has no rales. He exhibits no tenderness.  Neurological: He is alert and oriented to person, place, and time.  Skin: Skin is warm and dry.  Psychiatric: He has a normal mood and affect. His behavior is normal. Judgment and thought content normal.        Assessment & Plan:   Problem List Items Addressed This Visit      Endocrine   Diabetes ([place]) - Primary    Obtain hemoglobin A1c, urine microalbumin, complete metabolic profile, and CBC. Appears to be adequately maintained with current medication regimen and no adverse side effects or hypoglycemic readings. Diabetic foot exam completed today. Pneumovax is up-to-date. Continue to monitor blood sugars at home 1-2 times daily. Maintained on losartan for CAD risk reduction. Follow-up and changes pending A1c results.      Relevant Orders   Hemoglobin A1c (Completed)   CBC (Completed)   Urine Microalbumin w/creat. ratio (Completed)   Comprehensive metabolic panel (Completed)     Other   Dehydration    Symptoms and exam consistent with dehydration appear resolved with no further episodes of dizziness. Possibly he related. Recheck hemoglobin today. Continue drink plenty of fluids and limit caffeine intake. Change position slowly as needed. Follow-up if symptoms return or worsen.          I have discontinued Mr. Navarrete ketotifen. I am also having him maintain his allopurinol, furosemide, losartan, nitroGLYCERIN, calcium carbonate, aspirin, cetirizine, metFORMIN, potassium chloride, rOPINIRole, gabapentin, and omeprazole.   Meds ordered this encounter  Medications  . omeprazole (PRILOSEC) 20 MG capsule    Sig: Take 20 mg by mouth 2 (two) times daily before a meal.     Follow-up: Return in about 6 months (around 06/10/2017), or if symptoms worsen or fail to improve.  Mauricio Po, FNP

## 2016-12-08 NOTE — Patient Instructions (Signed)
Thank you for choosing [facility].  SUMMARY AND INSTRUCTIONS:  Please continue to take your medication as prescribed.   Continue to monitor your blood sugars at home.   Drink of plenty of water.  Medication:  Your prescription(s) have been submitted to your pharmacy or been printed and provided for you. Please take as directed and contact our office if you believe you are having problem(s) with the medication(s) or have any questions.  Labs:  Please stop by the lab on the lower level of the building for your blood work. Your results will be released to [place] (or called to you) after review, usually within 72 hours after test completion. If any changes need to be made, you will be notified at that same time.  1.) The lab is open from 7:30am to 5:30 pm Monday-Friday 2.) No appointment is necessary 3.) Fasting (if needed) is 6-8 hours after food and drink; black coffee and water are okay   Follow up:  If your symptoms worsen or fail to improve, please contact our office for further instruction, or in case of emergency go directly to the emergency room at the closest medical facility.   DIABETES CARE INSTRUCTIONS:  Current A1c: No results found for: HGBA1C  A1C Goal: <7.0%  Fasting Blood Sugar Goal: 80-130 Blood sugar check that you take after fasting for at least 8 hours which is usually in the morning before eating.  Post-prandial Blood Sugar Goal: <180 Blood sugar check approximately 1-2 hours after the start of a meal.   Diabetes Prevention Screens:  1.) Ensure you have an annual eye exam by an ophthalmologist or optometrist.   2,) Ensure you have an annual foot exam or when any changes are noted including new onset numbness/tingling or wounds. Check your feet daily!  3.) The American Diabetes Association recommends the Pneumovax vaccination against pneumonia every 5 years.  4.) We will check your kidney function with a urine test on an annual basis.

## 2016-12-08 NOTE — Assessment & Plan Note (Signed)
Obtain hemoglobin A1c, urine microalbumin, complete metabolic profile, and CBC. Appears to be adequately maintained with current medication regimen and no adverse side effects or hypoglycemic readings. Diabetic foot exam completed today. Pneumovax is up-to-date. Continue to monitor blood sugars at home 1-2 times daily. Maintained on losartan for CAD risk reduction. Follow-up and changes pending A1c results.

## 2016-12-08 NOTE — Assessment & Plan Note (Signed)
Symptoms and exam consistent with dehydration appear resolved with no further episodes of dizziness. Possibly he related. Recheck hemoglobin today. Continue drink plenty of fluids and limit caffeine intake. Change position slowly as needed. Follow-up if symptoms return or worsen.

## 2017-01-25 ENCOUNTER — Encounter: Payer: Self-pay | Admitting: Family

## 2017-01-25 ENCOUNTER — Ambulatory Visit (INDEPENDENT_AMBULATORY_CARE_PROVIDER_SITE_OTHER): Payer: Medicare PPO | Admitting: Family

## 2017-01-25 ENCOUNTER — Other Ambulatory Visit (INDEPENDENT_AMBULATORY_CARE_PROVIDER_SITE_OTHER): Payer: Medicare PPO

## 2017-01-25 VITALS — BP 142/62 | HR 65 | Temp 98.5°F | Resp 18 | Ht 71.0 in | Wt 251.0 lb

## 2017-01-25 DIAGNOSIS — I10 Essential (primary) hypertension: Secondary | ICD-10-CM

## 2017-01-25 DIAGNOSIS — G4733 Obstructive sleep apnea (adult) (pediatric): Secondary | ICD-10-CM | POA: Insufficient documentation

## 2017-01-25 LAB — COMPREHENSIVE METABOLIC PANEL
ALBUMIN: 3.9 g/dL (ref 3.5–5.2)
ALT: 17 U/L (ref 0–53)
AST: 18 U/L (ref 0–37)
Alkaline Phosphatase: 69 U/L (ref 39–117)
BUN: 15 mg/dL (ref 6–23)
CALCIUM: 10.3 mg/dL (ref 8.4–10.5)
CHLORIDE: 104 meq/L (ref 96–112)
CO2: 32 mEq/L (ref 19–32)
Creatinine, Ser: 1.06 mg/dL (ref 0.40–1.50)
GFR: 73.01 mL/min (ref >60.00)
Glucose, Bld: 173 mg/dL — ABNORMAL HIGH (ref 70–99)
POTASSIUM: 4.9 meq/L (ref 3.5–5.1)
SODIUM: 140 meq/L (ref 135–145)
Total Bilirubin: 1.3 mg/dL — ABNORMAL HIGH (ref 0.2–1.2)
Total Protein: 6.5 g/dL (ref 6.0–8.3)

## 2017-01-25 NOTE — Patient Instructions (Signed)
Thank you for choosing [facility].  SUMMARY AND INSTRUCTIONS:  Please continue to take your medications as prescribed.  Drink plenty of non-caffinated fluids.   Change positions slowly.  We will check your blood work today.  A referral to pulmonology to discuss your sleep apena.   Try OTC melatononin as needed for sleep.  Labs:  Please stop by the lab on the lower level of the building for your blood work. Your results will be released to [place] (or called to you) after review, usually within 72 hours after test completion. If any changes need to be made, you will be notified at that same time.  1.) The lab is open from 7:30am to 5:30 pm Monday-Friday 2.) No appointment is necessary 3.) Fasting (if needed) is 6-8 hours after food and drink; black coffee and water are okay   Follow up:  If your symptoms worsen or fail to improve, please contact our office for further instruction, or in case of emergency go directly to the emergency room at the closest medical facility.

## 2017-01-25 NOTE — Progress Notes (Signed)
Subjective:    Patient ID: Aaron Maldonado, male    DOB: 07/23/1945, 71 y.o.   MRN: [id number]  Chief Complaint  Patient presents with  . Dizziness    has been having issues with fluctuating BP, either too high or too low, dizziness feeling tired alot     HPI:  Webster Patrone is a 71 y.o. male who  has a past medical history of Abnormal nuclear stress test (12/29/2013); Allergy; Arthritis; Chest pain; CHF (congestive heart failure) ([place]); Chicken pox; Colon polyps; Coronary artery disease; Depression; Diabetes mellitus without complication ([place]); Diverticulitis; Dyspnea on exertion; Heart murmur; History of heart surgery; Hyperlipidemia; Hypertension; and Stroke [facility]). and presents today for a follow up office visit.  Hypertension - Currently prescribed furosemide and losartan. Reports taking the medication as prescribed and denies adverse side effects. Does have hypotensive readings with blood pressure fluctuating through out the day. He was recently seen in the ED for weakness and was found to have a drop in hemoglobin with fecal occult test being negative. Endorses about 3 bottles of water daily of about 20 ounces each. Does drink caffeine about 2-3 cups per day. Previously diagnosed with sleep apnea and not currently using CPAP.  BP Readings from Last 3 Encounters:  01/25/17 (!) 142/62  12/08/16 118/68  11/27/16 (!) 146/69    Allergies  Allergen Reactions  . Keflex [Cephalexin]     unknown  . Lisinopril Cough  . Penicillins Rash    Has patient had a PCN reaction causing immediate rash, facial/tongue/throat swelling, SOB or lightheadedness with hypotension: No Has patient had a PCN reaction causing severe rash involving mucus membranes or skin necrosis: No Has patient had a PCN reaction that required hospitalization: No Has patient had a PCN reaction occurring within the last 10 years: No If all of the above answers are "NO", then may proceed with Cephalosporin use.   . Sulfa  Antibiotics Rash    rash  . Sulfasalazine Rash    rash      Outpatient Medications Prior to Visit  Medication Sig Dispense Refill  . allopurinol (ZYLOPRIM) 300 MG tablet Take 300 mg by mouth daily.    Marland Kitchen aspirin 81 MG tablet Take 81 mg by mouth daily.    . calcium carbonate (OS-CAL) 600 MG TABS tablet Take 600 mg by mouth daily.    . cetirizine (ZYRTEC) 10 MG tablet Take 10 mg by mouth daily.    . furosemide (LASIX) 40 MG tablet Take 40 mg by mouth.    . gabapentin (NEURONTIN) 300 MG capsule Take 600 mg by mouth at bedtime.    Marland Kitchen losartan (COZAAR) 50 MG tablet Take 1 tablet (50 mg total) by mouth daily. (Patient taking differently: Take 25 mg by mouth daily. ) 30 tablet 6  . metFORMIN (GLUCOPHAGE) 1000 MG tablet Take 1,000 mg by mouth 2 (two) times daily with a meal.    . nitroGLYCERIN (NITROSTAT) 0.4 MG SL tablet Place 1 tablet (0.4 mg total) under the tongue every 5 (five) minutes as needed for chest pain. 90 tablet 3  . omeprazole (PRILOSEC) 20 MG capsule Take 20 mg by mouth 2 (two) times daily before a meal.    . potassium chloride (KLOR-CON) 8 MEQ tablet Take 8 mEq by mouth daily.    Marland Kitchen rOPINIRole (REQUIP) 3 MG tablet Take 3 mg by mouth at bedtime.     No facility-administered medications prior to visit.       Past Surgical History:  Procedure Laterality  Date  . KNEE SURGERY    . LEFT HEART CATHETERIZATION WITH CORONARY ANGIOGRAM N/A 01/06/2014   Procedure: LEFT HEART CATHETERIZATION WITH CORONARY ANGIOGRAM;  Surgeon: Lorretta Harp, MD;  Location: [facility] CATH LAB;  Service: Cardiovascular;  Laterality: N/A;  . SHOULDER SURGERY    . TONSILLECTOMY        Past Medical History:  Diagnosis Date  . Abnormal nuclear stress test 12/29/2013  . Allergy   . Arthritis   . Chest pain   . CHF (congestive heart failure) ([place])   . Chicken pox   . Colon polyps   . Coronary artery disease   . Depression   . Diabetes mellitus without complication ([place])   . Diverticulitis   . Dyspnea  on exertion   . Heart murmur   . History of heart surgery   . Hyperlipidemia   . Hypertension   . Stroke [facility])     Review of Systems  Constitutional: Negative for chills and fever.  Eyes:       Negative for changes in vision  Respiratory: Negative for cough, chest tightness and wheezing.   Cardiovascular: Negative for chest pain, palpitations and leg swelling.  Neurological: Negative for dizziness, weakness and light-headedness.      Objective:    BP (!) 142/62 (BP Location: Left Arm, Cuff Size: Large)   Pulse 65   Temp 98.5 F (36.9 C) (Oral)   Resp 18   Ht 5' 11" (1.803 m)   Wt 251 lb (113.9 kg)   SpO2 93%   BMI 35.01 kg/m  Nursing note and vital signs reviewed.  Physical Exam  Constitutional: He is oriented to person, place, and time. He appears well-developed and well-nourished. No distress.  Cardiovascular: Normal rate, regular rhythm, normal heart sounds and intact distal pulses.   Pulmonary/Chest: Effort normal and breath sounds normal.  Neurological: He is alert and oriented to person, place, and time.  Skin: Skin is warm and dry.  Psychiatric: He has a normal mood and affect. His behavior is normal. Judgment and thought content normal.       Assessment & Plan:   Problem List Items Addressed This Visit      Cardiovascular and Mediastinum   Essential hypertension - Primary    Blood pressure remains labile with current medication regimen and likely related to dehydration. Encouraged to drink plenty of fluids and continue to take the losartan as prescribed. Monitor blood pressure at home and follow a low sodium diet.       Relevant Orders   Comp Met (CMET)     Respiratory   Obstructive sleep apnea    Likely the cause of his fatigue and decreased ability to fall asleep as he takes a nap during the day when he gets home from work. Encouraged compliance with CPAP regimen and will referral to pulmonology placed for further management of sleep apnea.        Relevant Orders   Ambulatory referral to Pulmonology       I am having Mr. Soderman maintain his allopurinol, furosemide, losartan, nitroGLYCERIN, calcium carbonate, aspirin, cetirizine, metFORMIN, potassium chloride, rOPINIRole, gabapentin, and omeprazole.   Follow-up: Return in about 3 months (around 04/26/2017), or if symptoms worsen or fail to improve.  Mauricio Po, FNP

## 2017-01-25 NOTE — Assessment & Plan Note (Signed)
Likely the cause of his fatigue and decreased ability to fall asleep as he takes a nap during the day when he gets home from work. Encouraged compliance with CPAP regimen and will referral to pulmonology placed for further management of sleep apnea.

## 2017-01-25 NOTE — Assessment & Plan Note (Signed)
Blood pressure remains labile with current medication regimen and likely related to dehydration. Encouraged to drink plenty of fluids and continue to take the losartan as prescribed. Monitor blood pressure at home and follow a low sodium diet.

## 2017-03-03 ENCOUNTER — Telehealth: Payer: Self-pay | Admitting: Internal Medicine

## 2017-03-03 NOTE — Telephone Encounter (Signed)
Previously seen by Dr. Gwenlyn Found-- report of near syncope today; EMS on arrival noted bigeminy and PVCs and in transit other NSVT runs per report but not captured.  At [facility] ED, they noted NSVT of up to 10 beats, patient reportedly minimally symptomatic with these.  They started amiodarone infusion and are requesting transfer.  Last cath with non-obstructive disease.  At OSH, elevated WBC and CRT-- recommend hospitalist admission with cardiology consult on arrival.

## 2017-03-16 ENCOUNTER — Ambulatory Visit: Payer: Medicare PPO | Admitting: Family

## 2017-03-23 ENCOUNTER — Encounter: Payer: Self-pay | Admitting: Internal Medicine

## 2017-03-23 ENCOUNTER — Ambulatory Visit (INDEPENDENT_AMBULATORY_CARE_PROVIDER_SITE_OTHER): Payer: Medicare PPO | Admitting: Internal Medicine

## 2017-03-23 ENCOUNTER — Other Ambulatory Visit (INDEPENDENT_AMBULATORY_CARE_PROVIDER_SITE_OTHER): Payer: Medicare PPO

## 2017-03-23 VITALS — BP 146/84 | HR 62 | Temp 98.1°F | Ht 71.0 in | Wt 247.0 lb

## 2017-03-23 DIAGNOSIS — Z0000 Encounter for general adult medical examination without abnormal findings: Secondary | ICD-10-CM | POA: Diagnosis not present

## 2017-03-23 DIAGNOSIS — Z1159 Encounter for screening for other viral diseases: Secondary | ICD-10-CM | POA: Diagnosis not present

## 2017-03-23 DIAGNOSIS — E1142 Type 2 diabetes mellitus with diabetic polyneuropathy: Secondary | ICD-10-CM

## 2017-03-23 DIAGNOSIS — Z0001 Encounter for general adult medical examination with abnormal findings: Secondary | ICD-10-CM | POA: Insufficient documentation

## 2017-03-23 DIAGNOSIS — Z23 Encounter for immunization: Secondary | ICD-10-CM

## 2017-03-23 LAB — LDL CHOLESTEROL, DIRECT: LDL DIRECT: 83 mg/dL

## 2017-03-23 LAB — URINALYSIS, ROUTINE W REFLEX MICROSCOPIC
BILIRUBIN URINE: NEGATIVE
HGB URINE DIPSTICK: NEGATIVE
KETONES UR: NEGATIVE
LEUKOCYTES UA: NEGATIVE
NITRITE: NEGATIVE
PH: 5.5 (ref 5.0–8.0)
Specific Gravity, Urine: 1.015 (ref 1.000–1.030)
Total Protein, Urine: NEGATIVE
Urine Glucose: NEGATIVE
Urobilinogen, UA: 0.2 (ref 0.0–1.0)
WBC UA: NONE SEEN — AB (ref 0–?)

## 2017-03-23 LAB — LIPID PANEL
CHOL/HDL RATIO: 5
CHOLESTEROL: 155 mg/dL (ref 0–200)
HDL: 29.8 mg/dL — AB (ref >39.00)
NonHDL: 125.15
TRIGLYCERIDES: 305 mg/dL — AB (ref 0.0–149.0)
VLDL: 61 mg/dL — AB (ref 0.0–40.0)

## 2017-03-23 LAB — HEPATIC FUNCTION PANEL
ALBUMIN: 4.3 g/dL (ref 3.5–5.2)
ALT: 15 U/L (ref 0–53)
AST: 16 U/L (ref 0–37)
Alkaline Phosphatase: 73 U/L (ref 39–117)
BILIRUBIN TOTAL: 1.5 mg/dL — AB (ref 0.2–1.2)
Bilirubin, Direct: 0.2 mg/dL (ref 0.0–0.3)
Total Protein: 6.7 g/dL (ref 6.0–8.3)

## 2017-03-23 LAB — BASIC METABOLIC PANEL
BUN: 14 mg/dL (ref 6–23)
CO2: 31 mEq/L (ref 19–32)
CREATININE: 1.05 mg/dL (ref 0.40–1.50)
Calcium: 10.1 mg/dL (ref 8.4–10.5)
Chloride: 101 mEq/L (ref 96–112)
GFR: 73.78 mL/min (ref >60.00)
Glucose, Bld: 234 mg/dL — ABNORMAL HIGH (ref 70–99)
Potassium: 4 mEq/L (ref 3.5–5.1)
Sodium: 138 mEq/L (ref 135–145)

## 2017-03-23 LAB — CBC WITH DIFFERENTIAL/PLATELET
BASOS PCT: 0.3 % (ref 0.0–3.0)
Basophils Absolute: 0 10*3/uL (ref 0.0–0.1)
EOS ABS: 0.1 10*3/uL (ref 0.0–0.7)
EOS PCT: 1.4 % (ref 0.0–5.0)
HEMATOCRIT: 43.6 % (ref 39.0–52.0)
HEMOGLOBIN: 14.8 g/dL (ref 13.0–17.0)
LYMPHS PCT: 27.6 % (ref 12.0–46.0)
Lymphs Abs: 2.3 10*3/uL (ref 0.7–4.0)
MCHC: 34 g/dL (ref 30.0–36.0)
MCV: 96.3 fl (ref 78.0–100.0)
MONO ABS: 0.6 10*3/uL (ref 0.1–1.0)
Monocytes Relative: 7 % (ref 3.0–12.0)
Neutro Abs: 5.4 10*3/uL (ref 1.4–7.7)
Neutrophils Relative %: 63.7 % (ref 43.0–77.0)
Platelets: 172 10*3/uL (ref 150.0–400.0)
RBC: 4.53 Mil/uL (ref 4.22–5.81)
RDW: 12.5 % (ref 11.5–15.5)
WBC: 8.5 10*3/uL (ref 4.0–10.5)

## 2017-03-23 LAB — HEMOGLOBIN A1C: Hgb A1c MFr Bld: 8.2 % — ABNORMAL HIGH (ref 4.6–6.5)

## 2017-03-23 LAB — MICROALBUMIN / CREATININE URINE RATIO
Creatinine,U: 28.5 mg/dL
Microalb Creat Ratio: 4 mg/g (ref 0.0–30.0)
Microalb, Ur: 1.1 mg/dL (ref 0.0–1.9)

## 2017-03-23 LAB — TSH: TSH: 3.08 u[IU]/mL (ref 0.35–4.50)

## 2017-03-23 LAB — PSA: PSA: 2.71 ng/mL (ref 0.10–4.00)

## 2017-03-23 NOTE — Progress Notes (Signed)
Subjective:    Patient ID: Aaron Maldonado, male    DOB: 08/17/1945, 71 y.o.   MRN: [id number]  HPI  Here for wellness and f/u;  Overall doing ok;  Pt denies Chest pain, worsening SOB, DOE, wheezing, orthopnea, PND, worsening LE edema, palpitations, dizziness or syncope.  Pt denies neurological change such as new headache, facial or extremity weakness.  Pt denies polydipsia, polyuria, or low sugar symptoms. Pt states overall good compliance with treatment and medications, good tolerability, and has been trying to follow appropriate diet.  Pt denies worsening depressive symptoms, suicidal ideation or panic. No fever, night sweats, wt loss, loss of appetite, or other constitutional symptoms.  Pt states good ability with ADL's, has low fall risk, home safety reviewed and adequate, no other significant changes in hearing or vision, and only occasionally active with exercise.  Here with sister herself in wheelchair s/p 4 back surg 4 yrs ago now with drop foot and wheelchair, he is primary caretaker, also helped by niece, but this is sometimes stressful  No interval hx Past Medical History:  Diagnosis Date  . Abnormal nuclear stress test 12/29/2013  . Allergy   . Arthritis   . Chest pain   . CHF (congestive heart failure) ([employer])   . Chicken pox   . Colon polyps   . Coronary artery disease   . Depression   . Diabetes mellitus without complication ([place])   . Diverticulitis   . Dyspnea on exertion   . Heart murmur   . History of heart surgery   . Hyperlipidemia   . Hypertension   . Stroke [facility])    Past Surgical History:  Procedure Laterality Date  . KNEE SURGERY    . SHOULDER SURGERY    . TONSILLECTOMY      reports that he quit smoking about 28 years ago. His smoking use included cigarettes. He has a 64.50 pack-year smoking history. he has never used smokeless tobacco. He reports that he does not drink alcohol or use drugs. family history includes Alzheimer's disease in his father; Breast  cancer in his sister; Heart disease in his mother; Heart failure in his mother; Hyperlipidemia in his maternal grandfather and maternal grandmother; Hypertension in his maternal grandfather, maternal grandmother, paternal grandfather, and paternal grandmother; 2 / [place] in his maternal grandfather and maternal grandmother; Mitral valve prolapse in his mother; SIDS in his daughter. Allergies  Allergen Reactions  . Keflex [Cephalexin]     unknown  . Lisinopril Cough  . Penicillins Rash    Has patient had a PCN reaction causing immediate rash, facial/tongue/throat swelling, SOB or lightheadedness with hypotension: No Has patient had a PCN reaction causing severe rash involving mucus membranes or skin necrosis: No Has patient had a PCN reaction that required hospitalization: No Has patient had a PCN reaction occurring within the last 10 years: No If all of the above answers are "NO", then may proceed with Cephalosporin use.   . Sulfa Antibiotics Rash    rash  . Sulfasalazine Rash    rash   Current Outpatient Medications on File Prior to Visit  Medication Sig Dispense Refill  . allopurinol (ZYLOPRIM) 300 MG tablet Take 300 mg by mouth daily.    Marland Kitchen aspirin 81 MG tablet Take 81 mg by mouth daily.    . calcium carbonate (OS-CAL) 600 MG TABS tablet Take 600 mg by mouth daily.    . cetirizine (ZYRTEC) 10 MG tablet Take 10 mg by mouth daily.    Marland Kitchen  furosemide (LASIX) 40 MG tablet Take 40 mg by mouth.    . gabapentin (NEURONTIN) 300 MG capsule Take 600 mg by mouth at bedtime.    Marland Kitchen losartan (COZAAR) 50 MG tablet Take 1 tablet (50 mg total) by mouth daily. (Patient taking differently: Take 25 mg by mouth daily. ) 30 tablet 6  . metFORMIN (GLUCOPHAGE) 1000 MG tablet Take 1,000 mg by mouth 2 (two) times daily with a meal.    . nitroGLYCERIN (NITROSTAT) 0.4 MG SL tablet Place 1 tablet (0.4 mg total) under the tongue every 5 (five) minutes as needed for chest pain. 90 tablet 3  . omeprazole  (PRILOSEC) 20 MG capsule Take 20 mg by mouth 2 (two) times daily before a meal.    . potassium chloride (KLOR-CON) 8 MEQ tablet Take 8 mEq by mouth daily.    Marland Kitchen rOPINIRole (REQUIP) 3 MG tablet Take 3 mg by mouth at bedtime.     No current facility-administered medications on file prior to visit.    Review of Systems Constitutional: Negative for other unusual diaphoresis, sweats, appetite or weight changes HENT: Negative for other worsening hearing loss, ear pain, facial swelling, mouth sores or neck stiffness.   Eyes: Negative for other worsening pain, redness or other visual disturbance.  Respiratory: Negative for other stridor or swelling Cardiovascular: Negative for other palpitations or other chest pain  Gastrointestinal: Negative for worsening diarrhea or loose stools, blood in stool, distention or other pain Genitourinary: Negative for hematuria, flank pain or other change in urine volume.  Musculoskeletal: Negative for myalgias or other joint swelling.  Skin: Negative for other color change, or other wound or worsening drainage.  Neurological: Negative for other syncope or numbness. Hematological: Negative for other adenopathy or swelling Psychiatric/Behavioral: Negative for hallucinations, other worsening agitation, SI, self-injury, or new decreased concentration All other system neg per pt    Objective:   Physical Exam BP (!) 146/84   Pulse 62   Temp 98.1 F (36.7 C) (Oral)   Ht 5\' 11"  (1.803 m)   Wt 247 lb (112 kg)   SpO2 98%   BMI 34.45 kg/m  VS noted,  Constitutional: Pt is oriented to person, place, and time. Appears well-developed and well-nourished, in no significant distress and comfortable Head: Normocephalic and atraumatic  Eyes: Conjunctivae and EOM are normal. Pupils are equal, round, and reactive to light Right Ear: External ear normal without discharge Left Ear: External ear normal without discharge Nose: Nose without discharge or deformity Mouth/Throat:  Oropharynx is without other ulcerations and moist  Neck: Normal range of motion. Neck supple. No JVD present. No tracheal deviation present or significant neck LA or mass Cardiovascular: Normal rate, regular rhythm, normal heart sounds and intact distal pulses.   Pulmonary/Chest: WOB normal and breath sounds without rales or wheezing  Abdominal: Soft. Bowel sounds are normal. NT. No HSM  Musculoskeletal: Normal range of motion. Lymphadenopathy: Has no other cervical adenopathy.  Neurological: Pt is alert and oriented to person, place, and time. Pt has normal reflexes. No cranial nerve deficit. Motor grossly intact, Gait intact Skin: Skin is warm and dry. No rash noted or new ulcerations Psychiatric:  Has normal mood and affect. Behavior is normal without agitation Trace bilat LE edema     Assessment & Plan:

## 2017-03-23 NOTE — Patient Instructions (Addendum)
You had the Tdap tetanus shot today  You will be contacted regarding the referral for: colonoscopy  Please continue all other medications as before, and refills have been done if requested.  Please have the pharmacy call with any other refills you may need.  Please continue your efforts at being more active, low cholesterol diet, and weight control.  You are otherwise up to date with prevention measures today.  Please keep your appointments with your specialists as you may have planned  Please go to the LAB in the Basement (turn left off the elevator) for the tests to be done today  You will be contacted by phone if any changes need to be made immediately.  Otherwise, you will receive a letter about your results with an explanation, but please check with MyChart first.  Please remember to sign up for MyChart if you have not done so, as this will be important to you in the future with finding out test results, communicating by private email, and scheduling acute appointments online when needed.  Please return in 6 months, or sooner if needed, with Lab testing done 3-5 days before

## 2017-03-24 ENCOUNTER — Encounter: Payer: Self-pay | Admitting: Internal Medicine

## 2017-03-24 ENCOUNTER — Other Ambulatory Visit: Payer: Self-pay | Admitting: Internal Medicine

## 2017-03-24 LAB — HEPATITIS C ANTIBODY
HEP C AB: NONREACTIVE
SIGNAL TO CUT-OFF: 0.01 (ref <1.00)

## 2017-03-24 MED ORDER — GLIPIZIDE ER 5 MG PO TB24: 5.0000 mg | ORAL_TABLET | Freq: Every day | ORAL | 3 refills | Status: DC

## 2017-03-25 ENCOUNTER — Encounter: Payer: Self-pay | Admitting: Internal Medicine

## 2017-03-25 NOTE — Assessment & Plan Note (Signed)

## 2017-03-25 NOTE — Assessment & Plan Note (Signed)
Asympt, to cont diet, f/u a1c with labs

## 2017-03-26 ENCOUNTER — Telehealth: Payer: Self-pay

## 2017-03-26 NOTE — Telephone Encounter (Signed)
Pt has been informed but stated that he has already been taking glipizide 10mg . Is the glipizide ER 5mg  taking place of that or will he be taking that in addition. Please advise.

## 2017-03-26 NOTE — Telephone Encounter (Signed)
-----   Message from Biagio Borg, MD sent at 03/24/2017  4:51 AM EDT ----- .Letter sent, cont same tx except  The test results show that your current treatment is OK, except the A1c was too high, with the goal being less than 7.  We should add a second medication for sugar called Glipizide ER 5 mg daily.  I will send a new prescription, and you should hear from the office as well.Redmond Baseman to please inform pt, I will do rx

## 2017-03-27 MED ORDER — GLIPIZIDE ER 10 MG PO TB24: 10.0000 mg | ORAL_TABLET | Freq: Every day | ORAL | 3 refills | Status: DC

## 2017-03-27 MED ORDER — SITAGLIPTIN PHOSPHATE 50 MG PO TABS: 50.0000 mg | ORAL_TABLET | Freq: Every day | ORAL | 3 refills | Status: DC

## 2017-03-27 NOTE — Telephone Encounter (Signed)
Pt has been informed and expressed understanding.  

## 2017-03-27 NOTE — Telephone Encounter (Signed)
For some reason the glipizide 10 ER was not on the list  I restart and sent this, also added Januvia 50 mg per day  Ok to let pt know

## 2017-03-27 NOTE — Addendum Note (Signed)
Addended by: Biagio Borg on: 03/27/2017 01:14 PM   Modules accepted: Orders

## 2017-04-06 ENCOUNTER — Encounter: Payer: Self-pay | Admitting: Cardiovascular Disease

## 2017-04-06 ENCOUNTER — Ambulatory Visit: Payer: Medicare PPO | Admitting: Cardiovascular Disease

## 2017-04-06 DIAGNOSIS — I10 Essential (primary) hypertension: Secondary | ICD-10-CM | POA: Diagnosis not present

## 2017-04-06 DIAGNOSIS — E7800 Pure hypercholesterolemia, unspecified: Secondary | ICD-10-CM | POA: Diagnosis not present

## 2017-04-06 DIAGNOSIS — I4729 Other ventricular tachycardia: Secondary | ICD-10-CM

## 2017-04-06 DIAGNOSIS — I472 Ventricular tachycardia: Secondary | ICD-10-CM | POA: Diagnosis not present

## 2017-04-06 NOTE — Assessment & Plan Note (Signed)
History of hyperlipidemia not on statin therapy. Her profile performed 03/23/17 with a total cholesterol 155, triglyceride level of 305 and HDL of 29.

## 2017-04-06 NOTE — Assessment & Plan Note (Signed)
History of essential hypertension blood pressure measures 130/76. He is on amlodipine and carvedilol. Continue current meds. Doesn't

## 2017-04-06 NOTE — Progress Notes (Signed)
04/06/2017 Aaron Maldonado   04/24/1946  [id number]  Primary Physician Aaron Borg, MD Primary Cardiologist: Aaron Harp MD Aaron Maldonado, [place]  HPI:  Aaron Maldonado is a 71 y.o. male moderately overweight divorced Caucasian male father of 4 living children (one deceased) and great grandfather to 6 grandchildren who I last saw in the office 04/06/16 . He is a retired Building surveyor. His primary care is provided by the [facility] in [place]. He was referred by the ER at [facility] for evaluation of chest pain. His cardiac risk factor profile is remarkable for 60 pack years of tobacco abuse having quit back in 1990. He has treated hypertension, diabetes or hyperlipidemia. There is no family history of heart disease. He's never had a heart attack or stroke. 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 He was complaining of chest pain and shortness of breath when I saw him 2 years ago which led to a Myoview stress test 12/11/13 that is high risk with apical scar and peri-infarct ischemia. His EF likewise was reduced by 2-D echo in the 40-45% range with an elevated apical and anterolateral as well as septal hypokinesia. This led to cardiac catheterization performed 01/06/14 by myself revealing noncritical CAD with normal LV function. In retrospect, he attributes the chest pain to falling in his bathtub several weeks prior to seeing me at that time.   He did have an episode of presyncope and was evaluated in [place] and transferred to [facility] where he had white cardiac catheterization by Dr. Philbert Maldonado revealing minimal CAD and an echo that showed normal LV function. He did have an event monitor that showed runs of nonsustained ventricular tachycardia. His losartan was discontinued and he was begun on amlodipine.    Current Meds  Medication Sig  . allopurinol (ZYLOPRIM) 300 MG tablet Take 300 mg  by mouth daily.  Marland Kitchen amLODipine (NORVASC) 5 MG tablet Take 5 mg daily by mouth.  Marland Kitchen aspirin 81 MG tablet Take 81 mg by mouth daily.  . calcium carbonate (OS-CAL) 600 MG TABS tablet Take 600 mg by mouth daily.  . carvedilol (COREG) 6.25 MG tablet Take 6.25 mg daily by mouth.  . cetirizine (ZYRTEC) 10 MG tablet Take 10 mg by mouth daily.  . furosemide (LASIX) 40 MG tablet Take 40 mg by mouth.  . gabapentin (NEURONTIN) 300 MG capsule Take 600 mg by mouth at bedtime.  Marland Kitchen glipiZIDE (GLUCOTROL XL) 10 MG 24 hr tablet Take 1 tablet (10 mg total) daily with breakfast by mouth.  . metFORMIN (GLUCOPHAGE) 1000 MG tablet Take 1,000 mg by mouth 2 (two) times daily with a meal.  . nitroGLYCERIN (NITROSTAT) 0.4 MG SL tablet Place 1 tablet (0.4 mg total) under the tongue every 5 (five) minutes as needed for chest pain.  Marland Kitchen omeprazole (PRILOSEC) 20 MG capsule Take 20 mg by mouth 2 (two) times daily before a meal.  . potassium chloride (KLOR-CON) 8 MEQ tablet Take 8 mEq by mouth daily.  Marland Kitchen rOPINIRole (REQUIP) 3 MG tablet Take 3 mg by mouth at bedtime.  . sitaGLIPtin (JANUVIA) 50 MG tablet Take 1 tablet (50 mg total) daily by mouth.  . [DISCONTINUED] losartan (COZAAR) 50 MG tablet Take 1 tablet (50 mg total) by mouth daily. (Patient taking differently: Take 25 mg by mouth daily. )     Allergies  Allergen Reactions  . Keflex [Cephalexin]  unknown  . Lisinopril Cough  . Penicillins Rash    Has patient had a PCN reaction causing immediate rash, facial/tongue/throat swelling, SOB or lightheadedness with hypotension: No Has patient had a PCN reaction causing severe rash involving mucus membranes or skin necrosis: No Has patient had a PCN reaction that required hospitalization: No Has patient had a PCN reaction occurring within the last 10 years: No If all of the above answers are "NO", then may proceed with Cephalosporin use.   . Sulfa Antibiotics Rash    rash  . Sulfasalazine Rash    rash    Social History     Socioeconomic History  . Marital status: Divorced    Spouse name: Not on file  . Number of children: 3  . Years of education: 9  . Highest education level: Not on file  Social Needs  . Financial resource strain: Not on file  . Food insecurity - worry: Not on file  . Food insecurity - inability: Not on file  . Transportation needs - medical: Not on file  . Transportation needs - non-medical: Not on file  Occupational History  . Occupation: retired    Comment: former Administrator  Tobacco Use  . Smoking status: Former Smoker    Packs/day: 1.50    Years: 43.00    Pack years: 64.50    Types: Cigarettes    Last attempt to quit: 05/22/1988    Years since quitting: 28.8  . Smokeless tobacco: Never Used  Substance and Sexual Activity  . Alcohol use: No  . Drug use: No  . Sexual activity: Not on file  Other Topics Concern  . Not on file  Social History Narrative   Fun/Hobby: Work    Denies abuse and feels safe at home.      Review of Systems: General: negative for chills, fever, night sweats or weight changes.  Cardiovascular: negative for chest pain, dyspnea on exertion, edema, orthopnea, palpitations, paroxysmal nocturnal dyspnea or shortness of breath Dermatological: negative for rash Respiratory: negative for cough or wheezing Urologic: negative for hematuria Abdominal: negative for nausea, vomiting, diarrhea, bright red blood per rectum, melena, or hematemesis Neurologic: negative for visual changes, syncope, or dizziness All other systems reviewed and are otherwise negative except as noted above.    Blood pressure 130/76, pulse 66, height 5\' 11"  (1.803 m), weight 247 lb 12.8 oz (112.4 kg), SpO2 94 %.  General appearance: alert and no distress Neck: no adenopathy, no carotid bruit, no JVD, supple, symmetrical, trachea midline and thyroid not enlarged, symmetric, no tenderness/mass/nodules Lungs: clear to auscultation bilaterally Heart: regular rate and rhythm, S1, S2  normal, no murmur, click, rub or gallop Extremities: extremities normal, atraumatic, no cyanosis or edema Pulses: 2+ and symmetric Skin: Skin color, texture, turgor normal. No rashes or lesions Neurologic: Alert and oriented X 3, normal strength and tone. Normal symmetric reflexes. Normal coordination and gait  EKG not performed today  ASSESSMENT AND PLAN:   Essential hypertension History of essential hypertension blood pressure measures 130/76. He is on amlodipine and carvedilol. Continue current meds. Doesn't  Hyperlipidemia History of hyperlipidemia not on statin therapy. Her profile performed 03/23/17 with a total cholesterol 155, triglyceride level of 305 and HDL of 29.  NSVT (nonsustained ventricular tachycardia) (HCC) History of recent presyncope with nonsustained VT. He was taken care of by Dr. Philbert Maldonado at [facility]. A cardiac catheterization was performed that showed nonobstructive disease and echo was normal as well. An event monitor  showed runs of nonsustained ventricular tachycardia. I'm going to refer him to Dr. Curt Bears for further evaluation of this. He may benefit from an placement of a loop recorder.      Aaron Harp MD FACP,FACC,FAHA, [facility] 04/06/2017 11:18 AM

## 2017-04-06 NOTE — Patient Instructions (Signed)
Medication Instructions: Your physician recommends that you continue on your current medications as directed. Please refer to the Current Medication list given to you today.   Follow-Up: You have been referred to Dr. Curt Bears for symptomatic PVCs.  We request that you follow-up in: 6 months with an extender and in 12 months with Dr Andria Rhein will receive a reminder letter in the mail two months in advance. If you don't receive a letter, please call our office to schedule the follow-up appointment.  If you need a refill on your cardiac medications before your next appointment, please call your pharmacy.

## 2017-04-06 NOTE — Assessment & Plan Note (Signed)
History of recent presyncope with nonsustained VT. He was taken care of by Dr. Philbert Riser at [facility]. A cardiac catheterization was performed that showed nonobstructive disease and echo was normal as well. An event monitor showed runs of nonsustained ventricular tachycardia. I'm going to refer him to Dr. Curt Bears for further evaluation of this. He may benefit from an placement of a loop recorder.

## 2017-04-20 ENCOUNTER — Encounter (HOSPITAL_BASED_OUTPATIENT_CLINIC_OR_DEPARTMENT_OTHER): Payer: Self-pay | Admitting: Emergency Medicine

## 2017-04-20 ENCOUNTER — Other Ambulatory Visit: Payer: Self-pay

## 2017-04-20 ENCOUNTER — Emergency Department (HOSPITAL_BASED_OUTPATIENT_CLINIC_OR_DEPARTMENT_OTHER)
Admission: EM | Admit: 2017-04-20 | Discharge: 2017-04-20 | Disposition: A | Discharge status: Home / Self Care | Payer: Medicare PPO | Attending: Emergency Medicine | Admitting: Emergency Medicine

## 2017-04-20 DIAGNOSIS — E119 Type 2 diabetes mellitus without complications: Secondary | ICD-10-CM | POA: Insufficient documentation

## 2017-04-20 DIAGNOSIS — I110 Hypertensive heart disease with heart failure: Secondary | ICD-10-CM | POA: Diagnosis not present

## 2017-04-20 DIAGNOSIS — Z7984 Long term (current) use of oral hypoglycemic drugs: Secondary | ICD-10-CM | POA: Insufficient documentation

## 2017-04-20 DIAGNOSIS — M542 Cervicalgia: Secondary | ICD-10-CM | POA: Insufficient documentation

## 2017-04-20 DIAGNOSIS — I2510 Atherosclerotic heart disease of native coronary artery without angina pectoris: Secondary | ICD-10-CM | POA: Diagnosis not present

## 2017-04-20 DIAGNOSIS — Z8673 Personal history of transient ischemic attack (TIA), and cerebral infarction without residual deficits: Secondary | ICD-10-CM | POA: Insufficient documentation

## 2017-04-20 DIAGNOSIS — Z79899 Other long term (current) drug therapy: Secondary | ICD-10-CM | POA: Diagnosis not present

## 2017-04-20 DIAGNOSIS — I509 Heart failure, unspecified: Secondary | ICD-10-CM | POA: Diagnosis not present

## 2017-04-20 DIAGNOSIS — Z7982 Long term (current) use of aspirin: Secondary | ICD-10-CM | POA: Diagnosis not present

## 2017-04-20 DIAGNOSIS — Z87891 Personal history of nicotine dependence: Secondary | ICD-10-CM | POA: Diagnosis not present

## 2017-04-20 MED ORDER — TRAMADOL HCL 50 MG PO TABS: 50.0000 mg | ORAL_TABLET | Freq: Four times a day (QID) | ORAL | 0 refills | Status: DC | PRN

## 2017-04-20 MED ORDER — LIDOCAINE-EPINEPHRINE (PF) 2 %-1:200000 IJ SOLN: 20.0000 mL | Freq: Once | INTRAMUSCULAR | Status: DC

## 2017-04-20 MED ORDER — IBUPROFEN 400 MG PO TABS
400.0000 mg | ORAL_TABLET | Freq: Once | ORAL | Status: AC
  Administered 2017-04-20: 400 mg via ORAL
  Filled 2017-04-20: qty 1

## 2017-04-20 MED ORDER — TRAMADOL HCL 50 MG PO TABS
50.0000 mg | ORAL_TABLET | Freq: Once | ORAL | Status: AC
  Administered 2017-04-20: 50 mg via ORAL
  Filled 2017-04-20: qty 1

## 2017-04-20 MED FILL — traMADol HCL 50 MG TABS: 50 | 3 days supply | Qty: 15 | Fill #0

## 2017-04-20 NOTE — ED Provider Notes (Signed)
[facility] EMERGENCY DEPARTMENT Provider Note   CSN: [id number] Arrival date & time: 04/20/17  0900     History   Chief Complaint Chief Complaint  Patient presents with  . Neck Pain    HPI Aaron Maldonado is a 70 y.o. male.  Patient c/o pain to left upper neck/scalp in the past week. States has had 'cricks' in neck in past, but this hurts worse. Pain constant, dull to sharp, mod/severe. No radiation or radicular pain. No numbness/weakness. No headaches. Denies trauma to area. No fall. No fever or chills. States at times he can feel a knot to area.    The history is provided by the patient.  Neck Pain   Pertinent negatives include no numbness, no headaches and no weakness.    Past Medical History:  Diagnosis Date  . Abnormal nuclear stress test 12/29/2013  . Allergy   . Arthritis   . Chest pain   . CHF (congestive heart failure) ([place])   . Chicken pox   . Colon polyps   . Coronary artery disease   . Depression   . Diabetes mellitus without complication ([place])   . Diverticulitis   . Dyspnea on exertion   . Heart murmur   . History of heart surgery   . Hyperlipidemia   . Hypertension   . Stroke [facility])     Patient Active Problem List   Diagnosis Date Noted  . Preventative health care 03/23/2017  . Obstructive sleep apnea 01/25/2017  . Dehydration 12/08/2016  . Abnormal nuclear stress test 12/29/2013  . NSVT (nonsustained ventricular tachycardia) ([place]) 12/29/2013  . Essential hypertension 11/25/2013  . Hyperlipidemia 11/25/2013  . Diabetes ([place]) 11/25/2013  . Chest pain 11/25/2013  . Dyspnea on exertion 11/25/2013    Past Surgical History:  Procedure Laterality Date  . KNEE SURGERY    . LEFT HEART CATHETERIZATION WITH CORONARY ANGIOGRAM N/A 01/06/2014   Procedure: LEFT HEART CATHETERIZATION WITH CORONARY ANGIOGRAM;  Surgeon: Lorretta Harp, MD;  Location: [facility] CATH LAB;  Service: Cardiovascular;  Laterality: N/A;  . SHOULDER SURGERY    .  TONSILLECTOMY         Home Medications    Prior to Admission medications   Medication Sig Start Date End Date Taking? Authorizing Provider  allopurinol (ZYLOPRIM) 300 MG tablet Take 300 mg by mouth daily.    [provider]  amLODipine (NORVASC) 5 MG tablet Take 5 mg daily by mouth. 03/06/17   [provider]  aspirin 81 MG tablet Take 81 mg by mouth daily.    [provider]  calcium carbonate (OS-CAL) 600 MG TABS tablet Take 600 mg by mouth daily.    [provider]  carvedilol (COREG) 6.25 MG tablet Take 6.25 mg daily by mouth. 03/06/17   [provider]  cetirizine (ZYRTEC) 10 MG tablet Take 10 mg by mouth daily.    [provider]  furosemide (LASIX) 40 MG tablet Take 40 mg by mouth.    [provider]  gabapentin (NEURONTIN) 300 MG capsule Take 600 mg by mouth at bedtime.    [provider]  glipiZIDE (GLUCOTROL XL) 10 MG 24 hr tablet Take 1 tablet (10 mg total) daily with breakfast by mouth. 03/27/17   Biagio Borg, MD  metFORMIN (GLUCOPHAGE) 1000 MG tablet Take 1,000 mg by mouth 2 (two) times daily with a meal.    [provider]  nitroGLYCERIN (NITROSTAT) 0.4 MG SL tablet Place 1 tablet (0.4 mg total) under  the tongue every 5 (five) minutes as needed for chest pain. 12/29/13   Isaiah Serge, NP  omeprazole (PRILOSEC) 20 MG capsule Take 20 mg by mouth 2 (two) times daily before a meal.    [provider]  potassium chloride (KLOR-CON) 8 MEQ tablet Take 8 mEq by mouth daily.    [provider]  rOPINIRole (REQUIP) 3 MG tablet Take 3 mg by mouth at bedtime.    [provider]  sitaGLIPtin (JANUVIA) 50 MG tablet Take 1 tablet (50 mg total) daily by mouth. 03/27/17 03/27/18  Biagio Borg, MD    Family History Family History  Problem Relation Age of Onset  . Heart failure Mother   . Heart disease Mother   . Mitral valve prolapse Mother        rheumatic fever, MVReplacement    . Alzheimer's disease Father   . Breast cancer Sister        and sacrum cancer  . SIDS Daughter   . Hyperlipidemia Maternal Grandmother   . Miscarriages / Stillbirths Maternal Grandmother   . Hypertension Maternal Grandmother   . Hyperlipidemia Maternal Grandfather   . Miscarriages / Stillbirths Maternal Grandfather   . Hypertension Maternal Grandfather   . Hypertension Paternal Grandmother   . Hypertension Paternal Grandfather     Social History Social History   Tobacco Use  . Smoking status: Former Smoker    Packs/day: 1.50    Years: 43.00    Pack years: 64.50    Types: Cigarettes    Last attempt to quit: 05/22/1988    Years since quitting: 28.9  . Smokeless tobacco: Never Used  Substance Use Topics  . Alcohol use: No  . Drug use: No     Allergies   Keflex [cephalexin]; Lisinopril; Penicillins; Sulfa antibiotics; and Sulfasalazine   Review of Systems Review of Systems  Constitutional: Negative for chills and fever.  Musculoskeletal: Positive for neck pain.  Skin: Negative for rash.  Neurological: Negative for weakness, numbness and headaches.     Physical Exam Updated Vital Signs BP (!) 142/61 (BP Location: Left Arm)   Pulse (!) 58   Temp 97.8 F (36.6 C) (Oral)   Resp 18   Ht 1.803 m (5\' 11" )   Wt 112 kg (247 lb)   SpO2 97%   BMI 34.45 kg/m   Physical Exam  Constitutional: He appears well-developed and well-nourished. No distress.  HENT:  Mouth/Throat: Oropharynx is clear and moist.  Eyes: Conjunctivae are normal.  Neck: Neck supple. No tracheal deviation present.  Cardiovascular: Intact distal pulses.  Pulmonary/Chest: Effort normal. No accessory muscle usage. No respiratory distress.  Musculoskeletal: He exhibits no edema.  Cervical spine non tender, aligned.  Patient does have muscular tenderness left lateral superior neck and posterior scalp, +trigger point. No skin changes/lesions seen. No abscess.  No mass.   Neurological: He is alert.   Motor intact bil ext. Ambulates w steady gait.   Skin: Skin is warm and dry. No rash noted. He is not diaphoretic.  Psychiatric: He has a normal mood and affect.  Nursing note and vitals reviewed.    ED Treatments / Results  Labs (all labs ordered are listed, but only abnormal results are displayed) Labs Reviewed - No data to display  EKG  EKG Interpretation None       Radiology No results found.  Procedures Procedures (including critical care time)  Medications Ordered in ED Medications  lidocaine-EPINEPHrine (XYLOCAINE W/EPI) 2 %-1:200000 (PF) injection 20 mL (  not administered)  ibuprofen (ADVIL,MOTRIN) tablet 400 mg (not administered)  traMADol (ULTRAM) tablet 50 mg (not administered)     Initial Impression / Assessment and Plan / ED Course  I have reviewed the triage vital signs and the nursing notes.  Pertinent labs & imaging results that were available during my care of the patient were reviewed by me and considered in my medical decision making (see chart for details).  No meds today yet for pain.  Pt has ride, does not have to drive.  Ultram po. Motrin po.  Trigger point injection with lidocaine 2%%.  Area prepped w betadine/alcohol prep.   Discussed pcp f/u.    Final Clinical Impressions(s) / ED Diagnoses   Final diagnoses:  None    ED Discharge Orders    None       Lajean Saver, MD 04/20/17 1053

## 2017-04-20 NOTE — Discharge Instructions (Signed)
It was our pleasure to provide your ER care today - we hope that you feel better.  Try heat therapy and gentle massage of area.   Take motrin or aleve as need for pain.   You may take ultram as need for pain - no driving when taking.  Follow up with primary care doctor in the coming week if symptoms fail to improve/resolve.

## 2017-04-20 NOTE — ED Triage Notes (Signed)
Pt states he is having pain to left side of the back of his head over one week.  Pt states it feels like a knot and is very painful.  No fevers.

## 2017-04-23 ENCOUNTER — Ambulatory Visit (INDEPENDENT_AMBULATORY_CARE_PROVIDER_SITE_OTHER)
Admission: RE | Admit: 2017-04-23 | Discharge: 2017-04-23 | Disposition: A | Discharge status: Home / Self Care | Payer: Medicare PPO | Source: Ambulatory Visit | Attending: Internal Medicine | Admitting: Internal Medicine

## 2017-04-23 ENCOUNTER — Encounter: Payer: Self-pay | Admitting: Internal Medicine

## 2017-04-23 ENCOUNTER — Ambulatory Visit: Payer: Medicare PPO | Admitting: Internal Medicine

## 2017-04-23 ENCOUNTER — Telehealth: Payer: Self-pay | Admitting: Internal Medicine

## 2017-04-23 VITALS — BP 122/78 | HR 57 | Ht 71.0 in | Wt 252.0 lb

## 2017-04-23 DIAGNOSIS — R51 Headache: Secondary | ICD-10-CM | POA: Diagnosis not present

## 2017-04-23 DIAGNOSIS — I10 Essential (primary) hypertension: Secondary | ICD-10-CM

## 2017-04-23 DIAGNOSIS — G4486 Cervicogenic headache: Secondary | ICD-10-CM

## 2017-04-23 DIAGNOSIS — E1142 Type 2 diabetes mellitus with diabetic polyneuropathy: Secondary | ICD-10-CM | POA: Diagnosis not present

## 2017-04-23 MED ORDER — LINAGLIPTIN 5 MG PO TABS: 5.0000 mg | ORAL_TABLET | Freq: Every day | ORAL | 3 refills | Status: DC

## 2017-04-23 MED ORDER — PREDNISONE 10 MG PO TABS: ORAL_TABLET | ORAL | 0 refills | Status: DC

## 2017-04-23 MED ORDER — HYDROCODONE-ACETAMINOPHEN 5-325 MG PO TABS: 1.0000 | ORAL_TABLET | Freq: Two times a day (BID) | ORAL | 0 refills | Status: DC | PRN

## 2017-04-23 NOTE — Patient Instructions (Addendum)
Please take all new medication as prescribed - the pain medication as needed, and prednisone  Please take all new medication as prescribed - the Tradjenta 5 mg (done in hardcopy today to try to get at the [place])  Please continue all other medications as before, and refills have been done if requested.  Please have the pharmacy call with any other refills you may need.  Please keep your appointments with your specialists as you may have planned  Please go to the XRAY Department in the Basement (go straight as you get off the elevator) for the x-ray testing  You will be contacted by phone if any changes need to be made immediately.  Otherwise, you will receive a letter about your results with an explanation, but please check with MyChart first.  Please remember to sign up for MyChart if you have not done so, as this will be important to you in the future with finding out test results, communicating by private email, and scheduling acute appointments online when needed.

## 2017-04-23 NOTE — Assessment & Plan Note (Signed)
stable overall by history and exam, recent data reviewed with pt, and pt to continue medical treatment as before,  to f/u any worsening symptoms or concerns BP Readings from Last 3 Encounters:  04/23/17 122/78  04/20/17 (!) 144/73  04/06/17 130/76

## 2017-04-23 NOTE — Assessment & Plan Note (Signed)
Etiology unclear, not helped by current med tx with tramadol and gabapentin, for vicodin prn short term only, predpac asd, routine c-spine films, consider MRI cervical spine,  to f/u any worsening symptoms or concerns

## 2017-04-23 NOTE — Telephone Encounter (Signed)
Per Amy Pharmacist at [facility]: due to new opioid rules: For acute pain can only dispense 10 tablets or 2 per day x 5 days

## 2017-04-23 NOTE — Progress Notes (Signed)
Subjective:    Patient ID: Aaron Maldonado, male    DOB: 02-05-1946, 71 y.o.   MRN: [id number]  HPI  Here to f/u relatively localized left occipital sharp pain and soreness at the skull base without swelling or rash or other scalp change; states he has a "painful knot" but admits only had swelling with a recent lidocaine local injection, which unfortunately did not seem to help more than 1-2 days. Pain constant, now 6-8/10  No other radiation, somewhat worse to extend the head and neck, was seen at [facility] on 2 separate location and dates - was tried tx with tramadol but does not help.  Nothing else makes better or worse.  No prior hx. Already taking gabapentin 600 mg bid which does not seem to help.  Pt denies chest pain, increased sob or doe, wheezing, orthopnea, PND, increased LE swelling, palpitations, dizziness or syncope.   Pt denies polydipsia, polyuria.  Did not take [place] as was not supplied by the [place] system Past Medical History:  Diagnosis Date  . Abnormal nuclear stress test 12/29/2013  . Allergy   . Arthritis   . Chest pain   . CHF (congestive heart failure) ([place])   . Chicken pox   . Colon polyps   . Coronary artery disease   . Depression   . Diabetes mellitus without complication ([place])   . Diverticulitis   . Dyspnea on exertion   . Heart murmur   . History of heart surgery   . Hyperlipidemia   . Hypertension   . Stroke [facility])    Past Surgical History:  Procedure Laterality Date  . KNEE SURGERY    . LEFT HEART CATHETERIZATION WITH CORONARY ANGIOGRAM N/A 01/06/2014   Procedure: LEFT HEART CATHETERIZATION WITH CORONARY ANGIOGRAM;  Surgeon: Lorretta Harp, MD;  Location: [facility] CATH LAB;  Service: Cardiovascular;  Laterality: N/A;  . SHOULDER SURGERY    . TONSILLECTOMY      reports that he quit smoking about 28 years ago. His smoking use included cigarettes. He has a 64.50 pack-year smoking history. he has never used smokeless tobacco. He reports that he does not drink alcohol or  use drugs. family history includes Alzheimer's disease in his father; Breast cancer in his sister; Heart disease in his mother; Heart failure in his mother; Hyperlipidemia in his maternal grandfather and maternal grandmother; Hypertension in his maternal grandfather, maternal grandmother, paternal grandfather, and paternal grandmother; 79 / [place] in his maternal grandfather and maternal grandmother; Mitral valve prolapse in his mother; SIDS in his daughter. Allergies  Allergen Reactions  . Keflex [Cephalexin]     unknown  . Lisinopril Cough  . Penicillins Rash    Has patient had a PCN reaction causing immediate rash, facial/tongue/throat swelling, SOB or lightheadedness with hypotension: No Has patient had a PCN reaction causing severe rash involving mucus membranes or skin necrosis: No Has patient had a PCN reaction that required hospitalization: No Has patient had a PCN reaction occurring within the last 10 years: No If all of the above answers are "NO", then may proceed with Cephalosporin use.   . Sulfa Antibiotics Rash    rash  . Sulfasalazine Rash    rash   Current Outpatient Medications on File Prior to Visit  Medication Sig Dispense Refill  . allopurinol (ZYLOPRIM) 300 MG tablet Take 300 mg by mouth daily.    Marland Kitchen amLODipine (NORVASC) 5 MG tablet Take 5 mg daily by mouth.    Marland Kitchen aspirin 81 MG tablet Take  81 mg by mouth daily.    . calcium carbonate (OS-CAL) 600 MG TABS tablet Take 600 mg by mouth daily.    . carvedilol (COREG) 6.25 MG tablet Take 6.25 mg daily by mouth.    . cetirizine (ZYRTEC) 10 MG tablet Take 10 mg by mouth daily.    . furosemide (LASIX) 40 MG tablet Take 40 mg by mouth.    . gabapentin (NEURONTIN) 300 MG capsule Take 600 mg by mouth at bedtime.    Marland Kitchen glipiZIDE (GLUCOTROL XL) 10 MG 24 hr tablet Take 1 tablet (10 mg total) daily with breakfast by mouth. 90 tablet 3  . metFORMIN (GLUCOPHAGE) 1000 MG tablet Take 1,000 mg by mouth 2 (two) times daily  with a meal.    . nitroGLYCERIN (NITROSTAT) 0.4 MG SL tablet Place 1 tablet (0.4 mg total) under the tongue every 5 (five) minutes as needed for chest pain. 90 tablet 3  . omeprazole (PRILOSEC) 20 MG capsule Take 20 mg by mouth 2 (two) times daily before a meal.    . potassium chloride (KLOR-CON) 8 MEQ tablet Take 8 mEq by mouth daily.    Marland Kitchen rOPINIRole (REQUIP) 3 MG tablet Take 3 mg by mouth at bedtime.    . traMADol (ULTRAM) 50 MG tablet Take 1 tablet (50 mg total) by mouth every 6 (six) hours as needed. 15 tablet 0   No current facility-administered medications on file prior to visit.    Review of Systems  Constitutional: Negative for other unusual diaphoresis or sweats HENT: Negative for ear discharge or swelling Eyes: Negative for other worsening visual disturbances Respiratory: Negative for stridor or other swelling  Gastrointestinal: Negative for worsening distension or other blood Genitourinary: Negative for retention or other urinary change Musculoskeletal: Negative for other MSK pain or swelling Skin: Negative for color change or other new lesions Neurological: Negative for worsening tremors and other numbness  Psychiatric/Behavioral: Negative for worsening agitation or other fatigue All other system neg per pt    Objective:   Physical Exam BP 122/78   Pulse (!) 57   Ht 5\' 11"  (1.803 m)   Wt 252 lb (114.3 kg)   SpO2 99%   BMI 35.15 kg/m  VS noted, not ill or toxic appearing Constitutional: Pt appears in NAD HENT: Head: NCAT.  Right Ear: External ear normal.  Left Ear: External ear normal.  Eyes: . Pupils are equal, round, and reactive to light. Conjunctivae and EOM are normal Nose: without d/c or deformity Neck: Neck supple. Gross normal ROM, no mass or LA; there is a tender area left occiput base without swelling or rash Cardiovascular: Normal rate and regular rhythm.   Pulmonary/Chest: Effort normal and breath sounds without rales or wheezing.  Neurological: Pt is  alert. At baseline orientation, motor grossly intact Skin: Skin is warm. No rashes, other new lesions, no LE edema Psychiatric: Pt behavior is normal without agitation  No other exam findings    Assessment & Plan:

## 2017-04-23 NOTE — Assessment & Plan Note (Signed)
stable overall by history and exam, recent data reviewed with pt, and pt to continue medical treatment as before,  to f/u any worsening symptoms or concerns; mild uncontrolled a1c recent - to change [place] (not taking now) to tradjenta 5 qd, pt to seek help at [facility] for appropriate med change to similar med on the [place]

## 2017-04-24 ENCOUNTER — Encounter: Payer: Self-pay | Admitting: Internal Medicine

## 2017-04-24 NOTE — Telephone Encounter (Signed)
noted 

## 2017-04-25 ENCOUNTER — Telehealth: Payer: Self-pay | Admitting: Internal Medicine

## 2017-04-25 NOTE — Telephone Encounter (Signed)
Copied from [place] [id number]. Topic: Quick Communication - See Telephone Encounter >> Apr 25, 2017  2:48 PM Boyd Kerbs wrote: CRM for notification. See Telephone encounter for:  Please call patietn on results of the xrays.  No CRM to release.  04/25/17.

## 2017-04-25 NOTE — Telephone Encounter (Signed)
IMPRESSION: Degenerative disc and facet disease.  No acute bony abnormality.  Per Dr. Jenny Reichmann:  Letter sent, continue same treatment, consider seeing sports medicine

## 2017-04-30 ENCOUNTER — Institutional Professional Consult (permissible substitution): Payer: Medicare PPO | Admitting: Cardiology

## 2017-05-29 ENCOUNTER — Encounter: Payer: Self-pay | Admitting: Internal Medicine

## 2017-06-21 ENCOUNTER — Encounter: Payer: Self-pay | Admitting: Cardiology

## 2017-07-30 ENCOUNTER — Institutional Professional Consult (permissible substitution): Payer: Medicare PPO | Admitting: Internal Medicine

## 2017-08-03 ENCOUNTER — Institutional Professional Consult (permissible substitution): Payer: Medicare PPO | Admitting: Pulmonary Disease

## 2017-09-06 ENCOUNTER — Telehealth: Payer: Self-pay | Admitting: Internal Medicine

## 2017-09-06 ENCOUNTER — Encounter: Payer: Self-pay | Admitting: Internal Medicine

## 2017-09-06 ENCOUNTER — Ambulatory Visit: Payer: Medicare PPO | Admitting: Internal Medicine

## 2017-09-06 VITALS — BP 144/84 | HR 64 | Temp 98.5°F | Ht 71.0 in | Wt 245.0 lb

## 2017-09-06 DIAGNOSIS — I10 Essential (primary) hypertension: Secondary | ICD-10-CM | POA: Diagnosis not present

## 2017-09-06 DIAGNOSIS — E1142 Type 2 diabetes mellitus with diabetic polyneuropathy: Secondary | ICD-10-CM | POA: Diagnosis not present

## 2017-09-06 DIAGNOSIS — M542 Cervicalgia: Secondary | ICD-10-CM | POA: Diagnosis not present

## 2017-09-06 MED ORDER — TRAMADOL HCL 50 MG PO TABS: 50.0000 mg | ORAL_TABLET | Freq: Four times a day (QID) | ORAL | 2 refills | Status: DC | PRN

## 2017-09-06 NOTE — Patient Instructions (Addendum)
Please take all new medication as prescribed - the tramadol  Please continue all other medications as before, and refills have been done if requested.  Please have the pharmacy call with any other refills you may need  Please keep your appointments with your specialists as you may have planned  You will be contacted regarding the referral for: MRI and orthopedic

## 2017-09-06 NOTE — Progress Notes (Signed)
Subjective:    Patient ID: Aaron Maldonado, male    DOB: [birth year], 72 y.o.   MRN: [id number]  HPI  Here to f/u now with left sided constant stinging pain x 5 days, with radaition to the head, can be some better with lying down in a certain position, but o/w not better with position or heating pad. Pt denies bowel or bladder change, fever, wt loss,  worsening LE pain/numbness/weakness, gait change or falls. Can be somewhat staggery with first standing up in the AM.  Did have a stumble and fall down 3 steps , then fell trying to go back up the stairs.  Ibuprofen 600 mg not working  Not sure what he has done, except to pick up wheelchair in and out of the trunk for this sister at her visit.  Has hx of 1981 cervical spine bone fusion.  Last c spine films 2018 with cervical DDD and DDD and s/p poertial c5-6 fusion.  Pt denies chest pain, increased sob or doe, wheezing, orthopnea, PND, increased LE swelling, palpitations, dizziness or syncope.   Pt denies polydipsia, polyuria. Past Medical History:  Diagnosis Date  . Abnormal nuclear stress test 12/29/2013  . Allergy   . Arthritis   . Chest pain   . CHF (congestive heart failure) ([place])   . Chicken pox   . Colon polyps   . Coronary artery disease   . Depression   . Diabetes mellitus without complication ([place])   . Diverticulitis   . Dyspnea on exertion   . Heart murmur   . History of heart surgery   . Hyperlipidemia   . Hypertension   . Stroke [facility])    Past Surgical History:  Procedure Laterality Date  . KNEE SURGERY    . LEFT HEART CATHETERIZATION WITH CORONARY ANGIOGRAM N/A 01/06/2014   Procedure: LEFT HEART CATHETERIZATION WITH CORONARY ANGIOGRAM;  Surgeon: Lorretta Harp, MD;  Location: [facility] CATH LAB;  Service: Cardiovascular;  Laterality: N/A;  . SHOULDER SURGERY    . TONSILLECTOMY      reports that he quit smoking about 29 years ago. His smoking use included cigarettes. He has a 64.50 pack-year smoking history. He has never used  smokeless tobacco. He reports that he does not drink alcohol or use drugs. family history includes Alzheimer's disease in his father; Breast cancer in his sister; Heart disease in his mother; Heart failure in his mother; Hyperlipidemia in his maternal grandfather and maternal grandmother; Hypertension in his maternal grandfather, maternal grandmother, paternal grandfather, and paternal grandmother; 9 / [place] in his maternal grandfather and maternal grandmother; Mitral valve prolapse in his mother; SIDS in his daughter. Allergies  Allergen Reactions  . Keflex [Cephalexin]     unknown  . Lisinopril Cough  . Penicillins Rash    Has patient had a PCN reaction causing immediate rash, facial/tongue/throat swelling, SOB or lightheadedness with hypotension: No Has patient had a PCN reaction causing severe rash involving mucus membranes or skin necrosis: No Has patient had a PCN reaction that required hospitalization: No Has patient had a PCN reaction occurring within the last 10 years: No If all of the above answers are "NO", then may proceed with Cephalosporin use.   . Sulfa Antibiotics Rash    rash  . Sulfasalazine Rash    rash   Current Outpatient Medications on File Prior to Visit  Medication Sig Dispense Refill  . allopurinol (ZYLOPRIM) 300 MG tablet Take 300 mg by mouth daily.    Marland Kitchen amLODipine (NORVASC)  5 MG tablet Take 5 mg daily by mouth.    Marland Kitchen aspirin 81 MG tablet Take 81 mg by mouth daily.    . calcium carbonate (OS-CAL) 600 MG TABS tablet Take 600 mg by mouth daily.    . carvedilol (COREG) 6.25 MG tablet Take 6.25 mg daily by mouth.    . cetirizine (ZYRTEC) 10 MG tablet Take 10 mg by mouth daily.    . furosemide (LASIX) 40 MG tablet Take 40 mg by mouth.    . gabapentin (NEURONTIN) 300 MG capsule Take 600 mg by mouth at bedtime.    Marland Kitchen glipiZIDE (GLUCOTROL XL) 10 MG 24 hr tablet Take 1 tablet (10 mg total) daily with breakfast by mouth. 90 tablet 3  .  HYDROcodone-acetaminophen (NORCO/VICODIN) 5-325 MG tablet Take 1 tablet by mouth 2 (two) times daily as needed for moderate pain. 14 tablet 0  . linagliptin (TRADJENTA) 5 MG TABS tablet Take 1 tablet (5 mg total) by mouth daily. 90 tablet 3  . metFORMIN (GLUCOPHAGE) 1000 MG tablet Take 1,000 mg by mouth 2 (two) times daily with a meal.    . nitroGLYCERIN (NITROSTAT) 0.4 MG SL tablet Place 1 tablet (0.4 mg total) under the tongue every 5 (five) minutes as needed for chest pain. 90 tablet 3  . omeprazole (PRILOSEC) 20 MG capsule Take 20 mg by mouth 2 (two) times daily before a meal.    . potassium chloride (KLOR-CON) 8 MEQ tablet Take 8 mEq by mouth daily.    Marland Kitchen rOPINIRole (REQUIP) 3 MG tablet Take 3 mg by mouth at bedtime.     No current facility-administered medications on file prior to visit.    Review of Systems  Constitutional: Negative for other unusual diaphoresis or sweats HENT: Negative for ear discharge or swelling Eyes: Negative for other worsening visual disturbances Respiratory: Negative for stridor or other swelling  Gastrointestinal: Negative for worsening distension or other blood Genitourinary: Negative for retention or other urinary change Musculoskeletal: Negative for other MSK pain or swelling Skin: Negative for color change or other new lesions Neurological: Negative for worsening tremors and other numbness  Psychiatric/Behavioral: Negative for worsening agitation or other fatigue\ All other system neg per pt    Objective:   Physical Exam BP (!) 144/84   Pulse 64   Temp 98.5 F (36.9 C) (Oral)   Ht 5\' 11"  (1.803 m)   Wt 245 lb (111.1 kg)   SpO2 98%   BMI 34.17 kg/m  VS noted,  Constitutional: Pt appears in NAD HENT: Head: NCAT.  Right Ear: External ear normal.  Left Ear: External ear normal.  Eyes: . Pupils are equal, round, and reactive to light. Conjunctivae and EOM are normal Nose: without d/c or deformity Neck: Neck supple. Gross normal  ROM Cardiovascular: Normal rate and regular rhythm.   Pulmonary/Chest: Effort normal and breath sounds without rales or wheezing.  Spine nontender in midline, no signficiant left neck tender, swelling or rash Neurological: Pt is alert. At baseline orientation, motor grossly intact Skin: Skin is warm. No rashes, other new lesions, no LE edema Psychiatric: Pt behavior is normal without agitation  No other exam findings    Assessment & Plan:

## 2017-09-06 NOTE — Telephone Encounter (Signed)
Wouldn't you know, just our luck, I ordered the MRI today as well as the ortho referral  I suspect this can be taken into account with scheduling.  No need for other attention on my part I think

## 2017-09-06 NOTE — Telephone Encounter (Signed)
Copied from [place] [id number]. Topic: Quick Communication - See Telephone Encounter >> Sep 06, 2017  3:15 PM Rutherford Nail, [place] wrote: CRM for notification. See Telephone encounter for: 09/06/17. Elvera Maria, patient's sister, calling and states that [place] at [facility] called and states that the patient will need an MRI before they can schedule his appointment at [facility]. Please advise. CB#: [phone]

## 2017-09-07 ENCOUNTER — Encounter: Payer: Self-pay | Admitting: Internal Medicine

## 2017-09-07 NOTE — Assessment & Plan Note (Signed)
Etiology unclear, no neuro change but has hx of surgury and persistent pain > 6 wks with conservative management, for tramadol prn, MRI c spine and ortho referral

## 2017-09-07 NOTE — Assessment & Plan Note (Signed)
Mild elevated today likely situational, o/w stable overall by history and exam, recent data reviewed with pt, and pt to continue medical treatment as before,  to f/u any worsening symptoms or concerns BP Readings from Last 3 Encounters:  09/06/17 (!) 144/84  04/23/17 122/78  04/20/17 (!) 144/73

## 2017-09-07 NOTE — Assessment & Plan Note (Signed)
Lab Results  Component Value Date   HGBA1C 8.2 (H) 03/23/2017  stable overall by history and exam, recent data reviewed with pt, and pt to continue medical treatment as before,  to f/u any worsening symptoms or concerns

## 2017-09-21 ENCOUNTER — Ambulatory Visit (INDEPENDENT_AMBULATORY_CARE_PROVIDER_SITE_OTHER): Payer: Medicare PPO | Admitting: Internal Medicine

## 2017-09-21 ENCOUNTER — Encounter: Payer: Self-pay | Admitting: Internal Medicine

## 2017-09-21 ENCOUNTER — Other Ambulatory Visit (INDEPENDENT_AMBULATORY_CARE_PROVIDER_SITE_OTHER): Payer: Medicare PPO

## 2017-09-21 VITALS — BP 116/74 | HR 60 | Temp 98.0°F | Ht 71.0 in | Wt 247.0 lb

## 2017-09-21 DIAGNOSIS — R21 Rash and other nonspecific skin eruption: Secondary | ICD-10-CM

## 2017-09-21 DIAGNOSIS — E1142 Type 2 diabetes mellitus with diabetic polyneuropathy: Secondary | ICD-10-CM

## 2017-09-21 DIAGNOSIS — Z23 Encounter for immunization: Secondary | ICD-10-CM | POA: Diagnosis not present

## 2017-09-21 DIAGNOSIS — I10 Essential (primary) hypertension: Secondary | ICD-10-CM

## 2017-09-21 DIAGNOSIS — Z0000 Encounter for general adult medical examination without abnormal findings: Secondary | ICD-10-CM | POA: Diagnosis not present

## 2017-09-21 DIAGNOSIS — E7800 Pure hypercholesterolemia, unspecified: Secondary | ICD-10-CM | POA: Diagnosis not present

## 2017-09-21 LAB — BASIC METABOLIC PANEL
BUN: 14 mg/dL (ref 6–23)
CO2: 30 mEq/L (ref 19–32)
Calcium: 10 mg/dL (ref 8.4–10.5)
Chloride: 98 mEq/L (ref 96–112)
Creatinine, Ser: 1.11 mg/dL (ref 0.40–1.50)
GFR: 69.1 mL/min (ref >60.00)
GLUCOSE: 357 mg/dL — AB (ref 70–99)
POTASSIUM: 4.3 meq/L (ref 3.5–5.1)
SODIUM: 136 meq/L (ref 135–145)

## 2017-09-21 LAB — LIPID PANEL
CHOLESTEROL: 134 mg/dL (ref 0–200)
HDL: 31.4 mg/dL — ABNORMAL LOW (ref >39.00)
NonHDL: 102.83
Total CHOL/HDL Ratio: 4
Triglycerides: 318 mg/dL — ABNORMAL HIGH (ref 0.0–149.0)
VLDL: 63.6 mg/dL — ABNORMAL HIGH (ref 0.0–40.0)

## 2017-09-21 LAB — HEPATIC FUNCTION PANEL
ALT: 12 U/L (ref 0–53)
AST: 13 U/L (ref 0–37)
Albumin: 4 g/dL (ref 3.5–5.2)
Alkaline Phosphatase: 80 U/L (ref 39–117)
BILIRUBIN DIRECT: 0.2 mg/dL (ref 0.0–0.3)
BILIRUBIN TOTAL: 1.5 mg/dL — AB (ref 0.2–1.2)
TOTAL PROTEIN: 6.7 g/dL (ref 6.0–8.3)

## 2017-09-21 LAB — HEMOGLOBIN A1C: HEMOGLOBIN A1C: 9.9 % — AB (ref 4.6–6.5)

## 2017-09-21 LAB — LDL CHOLESTEROL, DIRECT: LDL DIRECT: 76 mg/dL

## 2017-09-21 MED ORDER — ROSUVASTATIN CALCIUM 10 MG PO TABS: 10.0000 mg | ORAL_TABLET | Freq: Every day | ORAL | 3 refills | Status: DC

## 2017-09-21 MED ORDER — TRIAMCINOLONE ACETONIDE 0.1 % EX CREA: 1.0000 "application " | TOPICAL_CREAM | Freq: Two times a day (BID) | CUTANEOUS | 0 refills | Status: AC

## 2017-09-21 NOTE — Progress Notes (Signed)
Subjective:    Patient ID: Aaron Maldonado, male    DOB: 1946/01/07, 72 y.o.   MRN: [id number]  HPI  Here to f/u; overall doing ok,  Pt denies chest pain, increasing sob or doe, wheezing, orthopnea, PND, increased LE swelling, palpitations, dizziness or syncope.  Pt denies new neurological symptoms such as new headache, or facial or extremity weakness or numbness.  Pt denies polydipsia, polyuria, or low sugar episode.  Pt states overall good compliance with meds, mostly trying to follow appropriate diet, with wt overall stable,  but little exercise however, as he has Chronic neck pain 3-4/10 and due for MRI neck in 2 days.  Admits to only taking 5 [facility] of the 10 mg prescribed though had no side effects  No other interval hx or new complaints Past Medical History:  Diagnosis Date  . Abnormal nuclear stress test 12/29/2013  . Allergy   . Arthritis   . Chest pain   . CHF (congestive heart failure) ([place])   . Chicken pox   . Colon polyps   . Coronary artery disease   . Depression   . Diabetes mellitus without complication ([place])   . Diverticulitis   . Dyspnea on exertion   . Heart murmur   . History of heart surgery   . Hyperlipidemia   . Hypertension   . Stroke [facility])    Past Surgical History:  Procedure Laterality Date  . KNEE SURGERY    . LEFT HEART CATHETERIZATION WITH CORONARY ANGIOGRAM N/A 01/06/2014   Procedure: LEFT HEART CATHETERIZATION WITH CORONARY ANGIOGRAM;  Surgeon: Lorretta Harp, MD;  Location: [facility] CATH LAB;  Service: Cardiovascular;  Laterality: N/A;  . SHOULDER SURGERY    . TONSILLECTOMY      reports that he quit smoking about 29 years ago. His smoking use included cigarettes. He has a 64.50 pack-year smoking history. He has never used smokeless tobacco. He reports that he does not drink alcohol or use drugs. family history includes Alzheimer's disease in his father; Breast cancer in his sister; Heart disease in his mother; Heart failure in his  mother; Hyperlipidemia in his maternal grandfather and maternal grandmother; Hypertension in his maternal grandfather, maternal grandmother, paternal grandfather, and paternal grandmother; 93 / [place] in his maternal grandfather and maternal grandmother; Mitral valve prolapse in his mother; SIDS in his daughter. Allergies  Allergen Reactions  . Keflex [Cephalexin]     unknown  . Lisinopril Cough  . Penicillins Rash    Has patient had a PCN reaction causing immediate rash, facial/tongue/throat swelling, SOB or lightheadedness with hypotension: No Has patient had a PCN reaction causing severe rash involving mucus membranes or skin necrosis: No Has patient had a PCN reaction that required hospitalization: No Has patient had a PCN reaction occurring within the last 10 years: No If all of the above answers are "NO", then may proceed with Cephalosporin use.   . Sulfa Antibiotics Rash    rash  . Sulfasalazine Rash    rash   Current Outpatient Medications on File Prior to Visit  Medication Sig Dispense Refill  . allopurinol (ZYLOPRIM) 300 MG tablet Take 300 mg by mouth daily.    Marland Kitchen amLODipine (NORVASC) 5 MG tablet Take 5 mg daily by mouth.    Marland Kitchen aspirin 81 MG tablet Take 81 mg by mouth daily.    . calcium carbonate (OS-CAL) 600 MG TABS tablet Take 600 mg by mouth daily.    . carvedilol (COREG) 6.25 MG  tablet Take 6.25 mg daily by mouth.    . cetirizine (ZYRTEC) 10 MG tablet Take 10 mg by mouth daily.    . furosemide (LASIX) 40 MG tablet Take 40 mg by mouth.    . gabapentin (NEURONTIN) 300 MG capsule Take 600 mg by mouth at bedtime.    Marland Kitchen glipiZIDE (GLUCOTROL XL) 10 MG 24 hr tablet Take 1 tablet (10 mg total) daily with breakfast by mouth. 90 tablet 3  . HYDROcodone-acetaminophen (NORCO/VICODIN) 5-325 MG tablet Take 1 tablet by mouth 2 (two) times daily as needed for moderate pain. 14 tablet 0  . linagliptin (TRADJENTA) 5 MG TABS tablet Take 1 tablet (5 mg total) by mouth daily. 90  tablet 3  . metFORMIN (GLUCOPHAGE) 1000 MG tablet Take 1,000 mg by mouth 2 (two) times daily with a meal.    . nitroGLYCERIN (NITROSTAT) 0.4 MG SL tablet Place 1 tablet (0.4 mg total) under the tongue every 5 (five) minutes as needed for chest pain. 90 tablet 3  . omeprazole (PRILOSEC) 20 MG capsule Take 20 mg by mouth 2 (two) times daily before a meal.    . potassium chloride (KLOR-CON) 8 MEQ tablet Take 8 mEq by mouth daily.    Marland Kitchen rOPINIRole (REQUIP) 3 MG tablet Take 3 mg by mouth at bedtime.    . traMADol (ULTRAM) 50 MG tablet Take 1 tablet (50 mg total) by mouth every 6 (six) hours as needed. 120 tablet 2   No current facility-administered medications on file prior to visit.    Review of Systems  Constitutional: Negative for other unusual diaphoresis or sweats HENT: Negative for ear discharge or swelling Eyes: Negative for other worsening visual disturbances Respiratory: Negative for stridor or other swelling  Gastrointestinal: Negative for worsening distension or other blood Genitourinary: Negative for retention or other urinary change Musculoskeletal: Negative for other MSK pain or swelling Skin: Negative for color change or other new lesions Neurological: Negative for worsening tremors and other numbness  Psychiatric/Behavioral: Negative for worsening agitation or other fatigue All other system neg per pt    Objective:   Physical Exam BP 116/74   Pulse 60   Temp 98 F (36.7 C) (Oral)   Ht 5\' 11"  (1.803 m)   Wt 247 lb (112 kg)   SpO2 95%   BMI 34.45 kg/m  VS noted,  Constitutional: Pt appears in NAD HENT: Head: NCAT.  Right Ear: External ear normal.  Left Ear: External ear normal.  Eyes: . Pupils are equal, round, and reactive to light. Conjunctivae and EOM are normal Nose: without d/c or deformity Neck: Neck supple. Gross normal ROM Cardiovascular: Normal rate and regular rhythm.   Pulmonary/Chest: Effort normal and breath sounds without rales or wheezing.  Abd:   Soft, NT, ND, + BS, no organomegaly Neurological: Pt is alert. At baseline orientation, motor grossly intact Skin: Skin is warm. + eczema rashe 2 cm at the the left medial distal leg,, other new lesions, no LE edema Psychiatric: Pt behavior is normal without agitation  No other exam findings    Assessment & Plan:

## 2017-09-21 NOTE — Patient Instructions (Addendum)
You had the Pneumovax pneumonia shot today  Ok to increase the crestor to 10 mg per day  Please take all new medication as prescribed - the steroid  Cream for the rash  Please continue all other medications as before, and refills have been done if requested.  Please have the pharmacy call with any other refills you may need.  Please continue your efforts at being more active, low cholesterol diet, and weight control.  You are otherwise up to date with prevention measures today.  Please keep your appointments with your specialists as you may have planned  Please go to the LAB in the Basement (turn left off the elevator) for the tests to be done today  You will be contacted by phone if any changes need to be made immediately.  Otherwise, you will receive a letter about your results with an explanation, but please check with MyChart first.  Please remember to sign up for MyChart if you have not done so, as this will be important to you in the future with finding out test results, communicating by private email, and scheduling acute appointments online when needed.  Please return in 6 months, or sooner if needed, with Lab testing done 3-5 days before

## 2017-09-22 DIAGNOSIS — R21 Rash and other nonspecific skin eruption: Secondary | ICD-10-CM | POA: Insufficient documentation

## 2017-09-22 NOTE — Assessment & Plan Note (Signed)
stable overall by history and exam, recent data reviewed with pt, and pt to continue medical treatment as before,  to f/u any worsening symptoms or concerns Lab Results  Component Value Date   LDLCALC 57 12/01/2013  for f/u labs

## 2017-09-22 NOTE — Assessment & Plan Note (Signed)
stable overall by history and exam, recent data reviewed with pt, and pt to continue medical treatment as before,  to f/u any worsening symptoms or concerns, for f/u lab today 

## 2017-09-22 NOTE — Assessment & Plan Note (Signed)
C/w smaller area of eczema type rash, for triamcinolone cr prn,  to f/u any worsening symptoms or concerns

## 2017-09-22 NOTE — Assessment & Plan Note (Signed)
BP Readings from Last 3 Encounters:  09/21/17 116/74  09/06/17 (!) 144/84  04/23/17 122/78  stable overall by history and exam, recent data reviewed with pt, and pt to continue medical treatment as before,  to f/u any worsening symptoms or concerns

## 2017-09-23 ENCOUNTER — Other Ambulatory Visit: Payer: Medicare PPO

## 2017-09-23 ENCOUNTER — Ambulatory Visit
Admission: RE | Admit: 2017-09-23 | Discharge: 2017-09-23 | Disposition: A | Discharge status: Home / Self Care | Payer: Medicare PPO | Source: Ambulatory Visit | Attending: Internal Medicine | Admitting: Internal Medicine

## 2017-09-23 DIAGNOSIS — M542 Cervicalgia: Secondary | ICD-10-CM

## 2017-09-24 ENCOUNTER — Encounter: Payer: Self-pay | Admitting: Internal Medicine

## 2017-09-24 ENCOUNTER — Telehealth: Payer: Self-pay

## 2017-09-24 NOTE — Telephone Encounter (Signed)
-----   Message from Biagio Borg, MD sent at 09/21/2017  7:09 PM EDT ----- Left message on MyChart, pt to cont same tx except  The test results show that your current treatment is OK, except the A1c is very high, despite your medications for diabetes.  Please make an appt to discuss the need to start insulin, as this appears to be needed.    Aaron Maldonado to please inform pt, and help arrange for ROV to start insulin

## 2017-09-24 NOTE — Telephone Encounter (Signed)
Pt has been informed and expressed understanding. He has scheduled an OV to come in to discuss starting insulin on 5/14

## 2017-09-26 ENCOUNTER — Ambulatory Visit: Payer: Medicare PPO | Admitting: Pulmonary Disease

## 2017-09-26 ENCOUNTER — Encounter: Payer: Self-pay | Admitting: Pulmonary Disease

## 2017-09-26 DIAGNOSIS — G4733 Obstructive sleep apnea (adult) (pediatric): Secondary | ICD-10-CM

## 2017-09-26 NOTE — Patient Instructions (Signed)
Home sleep study 

## 2017-09-26 NOTE — Progress Notes (Signed)
Subjective:    Patient ID: Aaron Maldonado, male    DOB: Mar 31, 1946, 72 y.o.   MRN: [id number]  HPI  Chief Complaint  Patient presents with  . Sleep consult    Has had a sleep study in lab in 2003. But does not remeber the results of this study. He snoring at night , day sleepyness, Still sleepy when wakes up.    72 year old veteran, diabetic and hypertensive, presents for evaluation of sleep disordered breathing. He was diagnosed with OSA in 2003 by a sleep study done in [place] and and advised to get on CPAP but he never did.  His ex-wife had OSA and uses CPAP machine so he is familiar.  Epworth sleepiness score is 13, he is accompanied by his daughter [name] who reports excessive daytime somnolence especially while watching TV, sitting and reading, lying down to rest in the afternoons or sitting quietly after lunch. 3.  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  Apneas have been noted by his daughter and sister whom he lives with. Bedtime is between 10:11 PM, sleep latency is variable up to an hour and sometimes longer, he sleeps on his right side with one pillow and has another pillow between his legs, reports 2-3 nocturnal awakenings including nocturia and is out of bed by 6 AM on days that he has to work, and other mornings he can sleep up to 10 AM and then has breakfast in the right back to sleep.  Naps are frequent and are not refreshing.  He denies any somnolence while driving.  His weight has been steady for the last few years 5 pounds.  There is no history suggestive of cataplexy, sleep paralysis or parasomnias  Diabetes is uncontrolled and he is about to get on insulin.  Hypertension is controlled with 2 medications He quit smoking in 1990, smoked about 20 pack years.  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 Family history of heart disease in his mother rheumatism in father and  mother and breast cancer in his sister  Past Medical History:  Diagnosis Date  . Abnormal nuclear stress test 12/29/2013  . Allergy   . Arthritis   . Chest pain   . CHF (congestive heart failure) ([place])   . Chicken pox   . Colon polyps   . Coronary artery disease   . Depression   . Diabetes mellitus without complication ([place])   . Diverticulitis   . Dyspnea on exertion   . Heart murmur   . History of heart surgery   . Hyperlipidemia   . Hypertension   . Stroke [facility])    Past Surgical History:  Procedure Laterality Date  . KNEE SURGERY    . LEFT HEART CATHETERIZATION WITH CORONARY ANGIOGRAM N/A 01/06/2014   Procedure: LEFT HEART CATHETERIZATION WITH CORONARY ANGIOGRAM;  Surgeon: Lorretta Harp, MD;  Location: [facility] C[facility] LAB;  Service: Cardiovascular;  Laterality: N/A;  . SHOULDER SURGERY    . TONSILLECTOMY      Allergies  Allergen Reactions  . Keflex [Cephalexin]     unknown  . Lisinopril Cough  . Penicillins Rash    Has patient had a PCN reaction causing immediate rash, facial/tongue/throat swelling, SOB or lightheadedness with hypotension: No Has patient had a PCN reaction causing severe rash involving mucus membranes or skin necrosis: No Has patient had a PCN reaction that required hospitalization: No  Has patient had a PCN reaction occurring within the last 10 years: No If all of the above answers are "NO", then may proceed with Cephalosporin use.   . Sulfa Antibiotics Rash    rash  . Sulfasalazine Rash    rash    Social History   Socioeconomic History  . Marital status: Divorced    Spouse name: Not on file  . Number of children: 3  . Years of education: 9  . Highest education level: Not on file  Occupational History  . 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 Financial resource strain: Not on file  . Food insecurity:    Worry: Not on file    Inability: Not on file  . Transportation needs:    Medical: Not on file     Non-medical: Not on file  Tobacco Use  . Smoking status: Former Smoker    Packs/day: 1.50    Years: 43.00    Pack years: 64.50    Types: Cigarettes    Last attempt to quit: 05/22/1988    Years since quitting: 29.3  . Smokeless tobacco: Never Used  Substance and Sexual Activity  . Alcohol use: No  . Drug use: No  . Sexual activity: Not on file  Lifestyle  . Physical activity:    Days per week: Not on file    Minutes per session: Not on file  . Stress: Not on file  Relationships  . Social connections:    Talks on phone: Not on file    Gets together: Not on file    Attends religious service: Not on file    Active member of club or organization: Not on file    Attends meetings of clubs or organizations: Not on file    Relationship status: Not on file  . Intimate partner violence:    Fear of current or ex partner: Not on file    Emotionally abused: Not on file    Physically abused: Not on file    Forced sexual activity: Not on file  Other Topics Concern  . Not on file  Social History Narrative   Fun/Hobby: Work    Denies abuse and feels safe at home.      Family History  Problem Relation Age of Onset  . Heart failure Mother   . Heart disease Mother   . Mitral valve prolapse Mother        rheumatic fever, MVReplacement  . Alzheimer's disease Father   . Breast cancer Sister        and sacrum cancer  . SIDS Daughter   . Hyperlipidemia Maternal Grandmother   . Miscarriages / Stillbirths Maternal Grandmother   . Hypertension Maternal Grandmother   . Hyperlipidemia Maternal Grandfather   . Miscarriages / Stillbirths Maternal Grandfather   . Hypertension Maternal Grandfather   . Hypertension Paternal Grandmother   . Hypertension Paternal Grandfather      Review of Systems  Constitutional: Positive for unexpected weight change.  Cardiovascular: Positive for leg swelling.  Musculoskeletal: Positive for joint swelling.  Allergic/Immunologic: Positive for environmental  allergies.  Hematological: Bruises/bleeds easily.       Objective:   Physical Exam  Gen. Pleasant, obese, in no distress, normal affect ENT - no lesions, no post nasal drip, class 2-3 airway Neck: No JVD, no thyromegaly, no carotid bruits Lungs: no use of accessory muscles, no dullness to percussion, decreased without rales or rhonchi  Cardiovascular: Rhythm regular, heart  sounds  normal, no murmurs or gallops, 1+ peripheral edema Abdomen: soft and non-tender, no hepatosplenomegaly, BS normal. Musculoskeletal: No deformities, no cyanosis or clubbing Neuro:  alert, non focal, no tremors        Assessment & Plan:

## 2017-09-26 NOTE — Assessment & Plan Note (Signed)
Given excessive daytime somnolence, narrow pharyngeal exam, witnessed apneas & loud snoring, obstructive sleep apnea is very likely & an overnight polysomnogram will be scheduled as a home study. The pathophysiology of obstructive sleep apnea , it's cardiovascular consequences & modes of treatment including CPAP were discused with the patient in detail & they evidenced understanding.  Pretest probability is high.  He would like to get his machine from the [place] ultimately if needed

## 2017-09-28 ENCOUNTER — Ambulatory Visit (INDEPENDENT_AMBULATORY_CARE_PROVIDER_SITE_OTHER): Payer: Self-pay | Admitting: Specialist

## 2017-10-02 ENCOUNTER — Ambulatory Visit: Payer: Medicare PPO | Admitting: Internal Medicine

## 2017-10-02 ENCOUNTER — Encounter: Payer: Self-pay | Admitting: Internal Medicine

## 2017-10-02 VITALS — BP 142/82 | HR 80 | Temp 97.6°F | Ht 70.0 in | Wt 245.0 lb

## 2017-10-02 DIAGNOSIS — E7800 Pure hypercholesterolemia, unspecified: Secondary | ICD-10-CM

## 2017-10-02 DIAGNOSIS — I10 Essential (primary) hypertension: Secondary | ICD-10-CM

## 2017-10-02 DIAGNOSIS — E1142 Type 2 diabetes mellitus with diabetic polyneuropathy: Secondary | ICD-10-CM

## 2017-10-02 MED ORDER — INSULIN GLARGINE 100 UNIT/ML SOLOSTAR PEN: 15.0000 [IU] | PEN_INJECTOR | Freq: Every day | SUBCUTANEOUS | 11 refills | Status: DC

## 2017-10-02 MED ORDER — INSULIN PEN NEEDLE 32G X 4 MM MISC: 3 refills | Status: AC

## 2017-10-02 MED ORDER — INSULIN PEN NEEDLE 32G X 4 MM MISC: 3 refills | Status: DC

## 2017-10-02 NOTE — Assessment & Plan Note (Signed)
Uncontrolled, ok to d/c glucotrol, cont other orals, and start lantus 15 units daily with instructions to self titrate until < 200 average

## 2017-10-02 NOTE — Assessment & Plan Note (Signed)
Lab Results  Component Value Date   LDLCALC 57 12/01/2013

## 2017-10-02 NOTE — Patient Instructions (Addendum)
Ok to stop the glipizide (glucotrol)  Please take all new medication as prescribed - the Lantus at 15 units per day (sent to local pharmacy, but also given in hardcopy for the [place])  You should check your sugars at least 3-4 times per day (before meals and bedtime)  OK to increase the Lantus by 3 units every 3 days for an Average Blood Sugar that is still over 200  Please call if you have difficulty for advice  Please continue all other medications as before, and refills have been done if requested.  Please have the pharmacy call with any other refills you may need.  Please continue your efforts at being more active, low cholesterol diet, and weight control.  Please keep your appointments with your specialists as you may have planned  Please return as previously scheduled

## 2017-10-02 NOTE — Assessment & Plan Note (Signed)
stable overall by history and exam, recent data reviewed with pt, and pt to continue medical treatment as before,  to f/u any worsening symptoms or concerns Lab Results  Component Value Date   HGBA1C 9.9 (H) 09/21/2017

## 2017-10-02 NOTE — Progress Notes (Signed)
Subjective:    Patient ID: Aaron Maldonado, male    DOB: Apr 27, 1946, 72 y.o.   MRN: [id number]  HPI  Here to f/u; overall doing ok,  Pt denies chest pain, increasing sob or doe, wheezing, orthopnea, PND, increased LE swelling, palpitations, dizziness or syncope.  Pt denies new neurological symptoms such as new headache, or facial or extremity weakness or numbness.  Pt denies polydipsia, polyuria, or low sugar episode.  Pt states overall good compliance with meds, mostly trying to follow appropriate diet, with wt overall stable,  but little exercise however. Needs to start new insluin due to severe elev a1c on 3 oral meds with good compliance. No other new complaints Past Medical History:  Diagnosis Date  . Abnormal nuclear stress test 12/29/2013  . Allergy   . Arthritis   . Chest pain   . CHF (congestive heart failure) ([place])   . Chicken pox   . Colon polyps   . Coronary artery disease   . Depression   . Diabetes mellitus without complication ([place])   . Diverticulitis   . Dyspnea on exertion   . Heart murmur   . History of heart surgery   . Hyperlipidemia   . Hypertension   . Stroke [facility])    Past Surgical History:  Procedure Laterality Date  . KNEE SURGERY    . LEFT HEART CATHETERIZATION WITH CORONARY ANGIOGRAM N/A 01/06/2014   Procedure: LEFT HEART CATHETERIZATION WITH CORONARY ANGIOGRAM;  Surgeon: Lorretta Harp, MD;  Location: [facility] CATH LAB;  Service: Cardiovascular;  Laterality: N/A;  . SHOULDER SURGERY    . TONSILLECTOMY      reports that he quit smoking about 29 years ago. His smoking use included cigarettes. He has a 64.50 pack-year smoking history. He has never used smokeless tobacco. He reports that he does not drink alcohol or use drugs. family history includes Alzheimer's disease in his father; Breast cancer in his sister; Heart disease in his mother; Heart failure in his mother; Hyperlipidemia in his maternal grandfather and maternal grandmother; Hypertension in his  maternal grandfather, maternal grandmother, paternal grandfather, and paternal grandmother; 69 / [place] in his maternal grandfather and maternal grandmother; Mitral valve prolapse in his mother; SIDS in his daughter. Allergies  Allergen Reactions  . Keflex [Cephalexin]     unknown  . Lisinopril Cough  . Penicillins Rash    Has patient had a PCN reaction causing immediate rash, facial/tongue/throat swelling, SOB or lightheadedness with hypotension: No Has patient had a PCN reaction causing severe rash involving mucus membranes or skin necrosis: No Has patient had a PCN reaction that required hospitalization: No Has patient had a PCN reaction occurring within the last 10 years: No If all of the above answers are "NO", then may proceed with Cephalosporin use.   . Sulfa Antibiotics Rash    rash  . Sulfasalazine Rash    rash   Current Outpatient Medications on File Prior to Visit  Medication Sig Dispense Refill  . allopurinol (ZYLOPRIM) 300 MG tablet Take 300 mg by mouth daily.    Marland Kitchen amLODipine (NORVASC) 5 MG tablet Take 5 mg daily by mouth.    Marland Kitchen aspirin 81 MG tablet Take 81 mg by mouth daily.    . calcium carbonate (OS-CAL) 600 MG TABS tablet Take 600 mg by mouth daily.    . carvedilol (COREG) 6.25 MG tablet Take 6.25 mg daily by mouth.    . cetirizine (ZYRTEC) 10 MG tablet Take 10 mg by mouth daily.    Marland Kitchen  furosemide (LASIX) 40 MG tablet Take 40 mg by mouth.    . gabapentin (NEURONTIN) 300 MG capsule Take 600 mg by mouth 2 (two) times daily.     Marland Kitchen HYDROcodone-acetaminophen (NORCO/VICODIN) 5-325 MG tablet Take 1 tablet by mouth 2 (two) times daily as needed for moderate pain. 14 tablet 0  . linagliptin (TRADJENTA) 5 MG TABS tablet Take 1 tablet (5 mg total) by mouth daily. 90 tablet 3  . metFORMIN (GLUCOPHAGE) 1000 MG tablet Take 1,000 mg by mouth 2 (two) times daily with a meal.    . nitroGLYCERIN (NITROSTAT) 0.4 MG SL tablet Place 1 tablet (0.4 mg total) under the tongue  every 5 (five) minutes as needed for chest pain. 90 tablet 3  . omeprazole (PRILOSEC) 20 MG capsule Take 20 mg by mouth 2 (two) times daily before a meal.    . potassium chloride (KLOR-CON) 8 MEQ tablet Take 8 mEq by mouth daily.    Marland Kitchen rOPINIRole (REQUIP) 3 MG tablet Take 3 mg by mouth at bedtime.    . rosuvastatin (CRESTOR) 10 MG tablet Take 1 tablet (10 mg total) by mouth daily. 90 tablet 3  . traMADol (ULTRAM) 50 MG tablet Take 1 tablet (50 mg total) by mouth every 6 (six) hours as needed. 120 tablet 2  . triamcinolone cream (KENALOG) 0.1 % Apply 1 application topically 2 (two) times daily. 30 g 0   No current facility-administered medications on file prior to visit.    Review of Systems  Constitutional: Negative for other unusual diaphoresis or sweats HENT: Negative for ear discharge or swelling Eyes: Negative for other worsening visual disturbances Respiratory: Negative for stridor or other swelling  Gastrointestinal: Negative for worsening distension or other blood Genitourinary: Negative for retention or other urinary change Musculoskeletal: Negative for other MSK pain or swelling Skin: Negative for color change or other new lesions Neurological: Negative for worsening tremors and other numbness  Psychiatric/Behavioral: Negative for worsening agitation or other fatigue All other system neg per pt    Objective:   Physical Exam BP (!) 142/82   Pulse 80   Temp 97.6 F (36.4 C) (Oral)   Ht 5\' 10"  (1.778 m)   Wt 245 lb (111.1 kg)   SpO2 97%   BMI 35.15 kg/m  VS noted,  Constitutional: Pt appears in NAD HENT: Head: NCAT.  Right Ear: External ear normal.  Left Ear: External ear normal.  Eyes: . Pupils are equal, round, and reactive to light. Conjunctivae and EOM are normal Nose: without d/c or deformity Neck: Neck supple. Gross normal ROM Cardiovascular: Normal rate and regular rhythm.   Pulmonary/Chest: Effort normal and breath sounds without rales or wheezing.    Neurological: Pt is alert. At baseline orientation, motor grossly intact Skin: Skin is warm. No rashes, other new lesions, no LE edema Psychiatric: Pt behavior is normal without agitation  No other exam findings    Assessment & Plan:

## 2017-10-09 ENCOUNTER — Ambulatory Visit: Payer: Medicare PPO | Admitting: Cardiology

## 2017-10-09 ENCOUNTER — Encounter: Payer: Self-pay | Admitting: Cardiology

## 2017-10-09 VITALS — BP 132/54 | HR 59 | Ht 70.0 in | Wt 247.6 lb

## 2017-10-09 DIAGNOSIS — I10 Essential (primary) hypertension: Secondary | ICD-10-CM

## 2017-10-09 DIAGNOSIS — R55 Syncope and collapse: Secondary | ICD-10-CM | POA: Insufficient documentation

## 2017-10-09 DIAGNOSIS — I25119 Atherosclerotic heart disease of native coronary artery with unspecified angina pectoris: Secondary | ICD-10-CM | POA: Diagnosis not present

## 2017-10-09 DIAGNOSIS — I2510 Atherosclerotic heart disease of native coronary artery without angina pectoris: Secondary | ICD-10-CM | POA: Diagnosis not present

## 2017-10-09 MED ORDER — CARVEDILOL 3.125 MG PO TABS: 3.1250 mg | ORAL_TABLET | Freq: Every day | ORAL | 4 refills | Status: DC

## 2017-10-09 NOTE — Assessment & Plan Note (Signed)
Followed by [facility], Dr Jenny Reichmann

## 2017-10-09 NOTE — Assessment & Plan Note (Signed)
Minor CAD at cath in Aug 2015 after an abnormal Myoview, and again Oct 2018 ([facility]) after a near syncopal episode- 30% RCA, normal LVF

## 2017-10-09 NOTE — Progress Notes (Signed)
10/09/2017 Aaron Maldonado   [birth year]  [id number]  Primary Physician Biagio Borg, MD Primary Cardiologist: Dr Gwenlyn Found  HPI:  Pleasant 72 y/o male, divorced, retired Programmer, systems. [facility] and Dr Gwenlyn Found. He lives with his sister. He was referred to Dr Gwenlyn Found in July 2015 by the ER at [facility] [place] for evaluation of chest pain.  A Myoview done 12/11/13 was "high risk". He was admitted for cath 01/06/14. This revealed Nl LVF and only a 30-40% RCA. He had done well since. Other problems include HTN and DM.  In Oct 2018 he was working on his RV at a storage lot when he became weak and had near syncope. His daughter happed to come by and called EMS. He was noted to have PVCs and he was transported to [facility] and admitted. Cath done there revealed no significant CAD and normal LVF. An OP monitor showed <10% pvcs with a 7 beat run of NSVT and a 7 beat run of PSVT. He has done wll since, no further near syncope. His cardiologist at [facility] took him off [place]. He is in the office today with his sister (who is n a wheel chair). He has had no chest pain or unusual dyspnea. They are planning to take the RV to [place] to see family.    Current Outpatient Medications  Medication Sig Dispense Refill  . allopurinol (ZYLOPRIM) 300 MG tablet Take 300 mg by mouth daily.    Marland Kitchen amLODipine (NORVASC) 5 MG tablet Take 5 mg daily by mouth.    Marland Kitchen aspirin 81 MG tablet Take 81 mg by mouth daily.    . calcium carbonate (OS-CAL) 600 MG TABS tablet Take 600 mg by mouth daily.    . carvedilol (COREG) 3.125 MG tablet Take 1 tablet (3.125 mg total) by mouth daily. 90 tablet 4  . cetirizine (ZYRTEC) 10 MG tablet Take 10 mg by mouth daily.    . furosemide (LASIX) 40 MG tablet Take 40 mg by mouth.    . gabapentin (NEURONTIN) 300 MG capsule Take 600 mg by mouth 2 (two) times daily.     Marland Kitchen glipiZIDE (GLUCOTROL XL) 10 MG 24 hr tablet Take 10 mg by mouth daily with breakfast.    .  HYDROcodone-acetaminophen (NORCO/VICODIN) 5-325 MG tablet Take 1 tablet by mouth 2 (two) times daily as needed for moderate pain. 14 tablet 0  . Insulin Glargine (LANTUS SOLOSTAR) 100 UNIT/ML Solostar Pen Inject 15 Units into the skin daily at 10 pm. 15 mL 11  . Insulin Pen Needle (BD PEN NEEDLE NANO U/F) 32G X 4 MM MISC 1 with pen use daily 100 each 3  . linagliptin (TRADJENTA) 5 MG TABS tablet Take 1 tablet (5 mg total) by mouth daily. 90 tablet 3  . metFORMIN (GLUCOPHAGE) 1000 MG tablet Take 1,000 mg by mouth 2 (two) times daily with a meal.    . nitroGLYCERIN (NITROSTAT) 0.4 MG SL tablet Place 1 tablet (0.4 mg total) under the tongue every 5 (five) minutes as needed for chest pain. 90 tablet 3  . omeprazole (PRILOSEC) 20 MG capsule Take 20 mg by mouth 2 (two) times daily before a meal.    . potassium chloride (KLOR-CON) 8 MEQ tablet Take 8 mEq by mouth daily.    Marland Kitchen rOPINIRole (REQUIP) 3 MG tablet Take 3 mg by mouth at bedtime.    . rosuvastatin (CRESTOR) 10 MG tablet Take 1 tablet (10 mg total) by mouth  daily. 90 tablet 3  . traMADol (ULTRAM) 50 MG tablet Take 1 tablet (50 mg total) by mouth every 6 (six) hours as needed. 120 tablet 2  . triamcinolone cream (KENALOG) 0.1 % Apply 1 application topically 2 (two) times daily. 30 g 0   No current facility-administered medications for this visit.     Allergies  Allergen Reactions  . Keflex [Cephalexin]     unknown  . Lisinopril Cough  . Penicillins Rash    Has patient had a PCN reaction causing immediate rash, facial/tongue/throat swelling, SOB or lightheadedness with hypotension: No Has patient had a PCN reaction causing severe rash involving mucus membranes or skin necrosis: No Has patient had a PCN reaction that required hospitalization: No Has patient had a PCN reaction occurring within the last 10 years: No If all of the above answers are "NO", then may proceed with Cephalosporin use.   . Sulfa Antibiotics Rash    rash  .  Sulfasalazine Rash    rash    Past Medical History:  Diagnosis Date  . Abnormal nuclear stress test 12/29/2013  . Allergy   . Arthritis   . Chest pain   . CHF (congestive heart failure) ([place])   . Chicken pox   . Colon polyps   . Coronary artery disease   . Depression   . Diabetes mellitus without complication ([place])   . Diverticulitis   . Dyspnea on exertion   . Heart murmur   . History of heart surgery   . Hyperlipidemia   . Hypertension   . Stroke [facility])     Social History   Socioeconomic History  . Marital status: Divorced    Spouse name: Not on file  . Number of children: 3  . Years of education: 9  . Highest education level: Not on file  Occupational History  . 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 Financial resource strain: Not on file  . Food insecurity:    Worry: Not on file    Inability: Not on file  . Transportation needs:    Medical: Not on file    Non-medical: Not on file  Tobacco Use  . Smoking status: Former Smoker    Packs/day: 1.50    Years: 43.00    Pack years: 64.50    Types: Cigarettes    Last attempt to quit: 05/22/1988    Years since quitting: 29.4  . Smokeless tobacco: Never Used  Substance and Sexual Activity  . Alcohol use: No  . Drug use: No  . Sexual activity: Not on file  Lifestyle  . Physical activity:    Days per week: Not on file    Minutes per session: Not on file  . Stress: Not on file  Relationships  . Social connections:    Talks on phone: Not on file    Gets together: Not on file    Attends religious service: Not on file    Active member of club or organization: Not on file    Attends meetings of clubs or organizations: Not on file    Relationship status: Not on file  . Intimate partner violence:    Fear of current or ex partner: Not on file    Emotionally abused: Not on file    Physically abused: Not on file    Forced sexual activity: Not on file  Other Topics Concern  . Not on  file  Social History Narrative  Fun/Hobby: Work    Denies abuse and feels safe at home.      Family History  Problem Relation Age of Onset  . Heart failure Mother   . Heart disease Mother   . Mitral valve prolapse Mother        rheumatic fever, MVReplacement  . Alzheimer's disease Father   . Breast cancer Sister        and sacrum cancer  . SIDS Daughter   . Hyperlipidemia Maternal Grandmother   . Miscarriages / Stillbirths Maternal Grandmother   . Hypertension Maternal Grandmother   . Hyperlipidemia Maternal Grandfather   . Miscarriages / Stillbirths Maternal Grandfather   . Hypertension Maternal Grandfather   . Hypertension Paternal Grandmother   . Hypertension Paternal Grandfather      Review of Systems: General: negative for chills, fever, night sweats or weight changes.  Cardiovascular: negative for chest pain, dyspnea on exertion, edema, orthopnea, palpitations, paroxysmal nocturnal dyspnea or shortness of breath Dermatological: negative for rash Respiratory: negative for cough or wheezing Urologic: negative for hematuria Abdominal: negative for nausea, vomiting, diarrhea, bright red blood per rectum, melena, or hematemesis Neurologic: negative for visual changes, syncope, or dizziness All other systems reviewed and are otherwise negative except as noted above.    Blood pressure (!) 132/54, pulse (!) 59, height 5\' 10"  (1.778 m), weight 247 lb 9.6 oz (112.3 kg).  General appearance: alert, cooperative and no distress Neck: no carotid bruit and no JVD Lungs: clear to auscultation bilaterally Heart: regular rate and rhythm Extremities: trace edema Skin: Skin color, texture, turgor normal. No rashes or lesions Neurologic: Grossly normal  EKG NSR, HR 59, run of accelerated junctional rythm  ASSESSMENT AND PLAN:   CAD (coronary artery disease), native coronary artery Minor CAD at cath in Aug 2015 after an abnormal Myoview, and again Oct 2018 ([facility]) after a near  syncopal episode- 30% RCA, normal LVF  Near syncope Admitted with near syncope and PVCs Oct 2018 to [facility] Cath no significant CAD, normal LVF, Holter unremarkable  Insulin dependent diabetes mellitus ([place]) [facility]  Essential hypertension Controlled   PLAN  I reviewed his EKG with Dr Sallyanne Kuster, will cut his Coreg in half- f/u with Dr Gwenlyn Found in a year. He will call if he has recurrent symptoms, if so he may need a Loop recorder.   Kerin Ransom PA-C 10/09/2017 11:10 AM

## 2017-10-09 NOTE — Assessment & Plan Note (Signed)
Admitted with near syncope and PVCs Oct 2018 to [facility] Cath no significant CAD, normal LVF, Holter unremarkable

## 2017-10-09 NOTE — Assessment & Plan Note (Signed)
Controlled.  

## 2017-10-09 NOTE — Patient Instructions (Signed)
Medication Instructions:  No medication changes   Follow-Up: Your physician wants you to follow-up in: 1 year with Dr. Gwenlyn Found. You will receive a reminder letter in the mail two months in advance. If you don't receive a letter, please call our office to schedule the follow-up appointment.  Any Other Special Instructions Will Be Listed Below (If Applicable).     If you need a refill on your cardiac medications before your next appointment, please call your pharmacy.

## 2017-10-23 ENCOUNTER — Telehealth: Payer: Self-pay | Admitting: Internal Medicine

## 2017-10-23 NOTE — Telephone Encounter (Signed)
Called pt daughter back she is wanting to verify if father need to continue taking other diabetic medications along w/lantus that MD rx. Inform daughter per MD instructions from last visit (10/02/17) MD stated to d/c glipizide, and continue taking metformin. Daughter understood instructions. Also updated med list../lmb

## 2017-10-23 NOTE — Addendum Note (Signed)
Addended by: Earnstine Regal on: 10/23/2017 09:22 AM   Modules accepted: Orders

## 2017-10-23 NOTE — Telephone Encounter (Signed)
Copied from [place] [id number]. Topic: Quick Communication - See Telephone Encounter >> Oct 23, 2017  8:46 AM Synthia Innocent wrote: CRM for notification. See Telephone encounter for: 10/23/17. Requesting to speak with nurse regarding diabetic meds. Wants to make sure he is to taking glipiZIDE (GLUCOTROL XL) 10 MG 24 hr tablet, metFORMIN (GLUCOPHAGE) 1000 MG tablet and Insulin Glargine (LANTUS SOLOSTAR) 100 UNIT/ML Solostar Pen

## 2017-10-29 ENCOUNTER — Ambulatory Visit (INDEPENDENT_AMBULATORY_CARE_PROVIDER_SITE_OTHER): Payer: Medicare PPO

## 2017-10-29 ENCOUNTER — Telehealth (INDEPENDENT_AMBULATORY_CARE_PROVIDER_SITE_OTHER): Payer: Self-pay | Admitting: Specialist

## 2017-10-29 ENCOUNTER — Encounter (INDEPENDENT_AMBULATORY_CARE_PROVIDER_SITE_OTHER): Payer: Self-pay | Admitting: Specialist

## 2017-10-29 ENCOUNTER — Ambulatory Visit (INDEPENDENT_AMBULATORY_CARE_PROVIDER_SITE_OTHER): Payer: Medicare PPO | Admitting: Specialist

## 2017-10-29 VITALS — BP 121/68 | HR 58 | Ht 71.0 in | Wt 247.0 lb

## 2017-10-29 DIAGNOSIS — M542 Cervicalgia: Secondary | ICD-10-CM

## 2017-10-29 DIAGNOSIS — M47812 Spondylosis without myelopathy or radiculopathy, cervical region: Secondary | ICD-10-CM | POA: Diagnosis not present

## 2017-10-29 DIAGNOSIS — Q761 Klippel-Feil syndrome: Secondary | ICD-10-CM | POA: Diagnosis not present

## 2017-10-29 MED ORDER — TRAMADOL HCL 50 MG PO TABS: 50.0000 mg | ORAL_TABLET | Freq: Four times a day (QID) | ORAL | 0 refills | Status: DC | PRN

## 2017-10-29 MED ORDER — DICLOFENAC SODIUM 50 MG PO TBEC: 50.0000 mg | DELAYED_RELEASE_TABLET | Freq: Three times a day (TID) | ORAL | 0 refills | Status: DC

## 2017-10-29 NOTE — Telephone Encounter (Signed)
Patient called and said he's already had an MRI on his neck and gone over the results with Dr. Jenny Reichmann and he was wondering if he still needed to keep this appointment or if he could cancel it. CB # [phone]

## 2017-10-29 NOTE — Progress Notes (Signed)
Office Visit Note   Patient: Aaron Maldonado           Date of Birth: [birth year]           MRN: [id number] Visit Date: 10/29/2017              Requested by: Biagio Borg, MD [address] [place], [place] [address] PCP: Biagio Borg, MD   Assessment & Plan: Visit Diagnoses:  1. Cervicalgia   2. Cervical fusion syndrome   3. Spondylosis without myelopathy or radiculopathy, cervical region     Plan: Avoid overhead lifting and overhead use of the arms. Do not lift greater than 5 lbs. Adjust head rest in vehicle to prevent hyperextension if rear ended. Take extra precautions to avoid falling.  Follow-Up Instructions: Return in about 1 month (around 11/26/2017).   Orders:  Orders Placed This Encounter  Procedures  . XR Cervical Spine 2 or 3 views   No orders of the defined types were placed in this encounter.     Procedures: No procedures performed   Clinical Data: No additional findings.   Subjective: Chief Complaint  Patient presents with  . Neck - Pain    72 year old right handed male with 2 month history of neck pain. Started when he was getting into his car and he was positioned back on the entry and hit his head against the car door sill. He has a history of previous surgery [place], [place] and he had a pinched nerve treated by Dr. Katharine Look. He underwent ACDF at C5-6.  He has had persistent pain since the injury. He is taking tramadol, he feels like the pain is better. He went to the [facility] [address] in [place]. He had an injection that was painful and was given tramadol and methocarbamol for muscle spasm. No collar. He has no no arm pain numbness or tingling. He is walking okay, no clumbsiness or off balance. He couldn't walk a mile. Now has pain with extension of the neck, looking up in the parts department of [facility] he has pain and irritated he has pain with turning side to side. No bowel or bladder difficulties.    Review of  Systems  Constitutional: Positive for activity change, appetite change, chills and unexpected weight change. Negative for diaphoresis, fatigue and fever.  HENT: Positive for congestion. Negative for dental problem, drooling, ear discharge, ear pain, facial swelling, hearing loss, mouth sores, nosebleeds, postnasal drip, rhinorrhea, sinus pressure, sinus pain, sneezing, sore throat, tinnitus, trouble swallowing and voice change.   Eyes: Positive for visual disturbance. Negative for photophobia, pain, discharge, redness and itching.  Respiratory: Negative for apnea, cough, choking, chest tightness, shortness of breath, wheezing and stridor.   Cardiovascular: Negative for chest pain, palpitations and leg swelling.  Gastrointestinal: Negative for abdominal distention, abdominal pain, anal bleeding, blood in stool, constipation, diarrhea, nausea, rectal pain and vomiting.  Endocrine: Negative for heat intolerance, polydipsia, polyphagia and polyuria.  Genitourinary: Negative for difficulty urinating, dysuria, enuresis, flank pain, frequency and genital sores.  Musculoskeletal: Positive for back pain and neck pain. Negative for arthralgias and gait problem.  Skin: Negative for color change, pallor, rash and wound.  Allergic/Immunologic: Negative for environmental allergies, food allergies and immunocompromised state.  Neurological: Negative for dizziness, tremors, seizures, syncope, facial asymmetry, speech difficulty, weakness, light-headedness, numbness and headaches.  Hematological: Negative for adenopathy. Does not bruise/bleed easily.  Psychiatric/Behavioral: Negative for agitation, behavioral problems, confusion, decreased  concentration, dysphoric mood, hallucinations, self-injury, sleep disturbance and suicidal ideas. The patient is not nervous/anxious and is not hyperactive.      Objective: Vital Signs: BP 121/68 (BP Location: Left Arm, Patient Position: Sitting)   Pulse (!) 58   Ht 5\' 11"   (1.803 m)   Wt 247 lb (112 kg)   BMI 34.45 kg/m   Physical Exam  Constitutional: He is oriented to person, place, and time. He appears well-developed and well-nourished.  HENT:  Head: Normocephalic and atraumatic.  Eyes: Pupils are equal, round, and reactive to light. EOM are normal.  Neck: Normal range of motion. Neck supple.  Pulmonary/Chest: Effort normal and breath sounds normal.  Abdominal: Soft. Bowel sounds are normal.  Neurological: He is alert and oriented to person, place, and time.  Skin: Skin is warm and dry.  Psychiatric: He has a normal mood and affect. His behavior is normal. Judgment and thought content normal.    Back Exam   Tenderness  The patient is experiencing tenderness in the cervical.  Range of Motion  Extension: abnormal  Flexion: abnormal  Lateral bend right: normal  Lateral bend left: normal  Rotation right: normal  Rotation left: normal   Muscle Strength  The patient has normal back strength. Right Quadriceps:  5/5  Left Quadriceps:  5/5  Right Hamstrings:  5/5  Left Hamstrings:  5/5   Tests  Straight leg raise right: negative Straight leg raise left: negative  Reflexes  Babinski's sign: normal   Other  Toe walk: normal Heel walk: normal Sensation: normal Gait: normal  Erythema: no back redness Scars: absent  Comments:  Decreased etension of the neck by 50% lateral bending to the right by 40% and to the left by 30%      Specialty Comments:  No specialty comments available.  Imaging: Xr Cervical Spine 2 Or 3 Views  Result Date: 10/29/2017 AP and lateral flexion and extension radiographs shows ACDF fusion at C5-6 and anterior large syndesmophytes at C4-5. No acute fracture or dislocation.     PMFS History: Patient Active Problem List   Diagnosis Date Noted  . CAD (coronary artery disease), native coronary artery 10/09/2017  . Near syncope 10/09/2017  . Rash 09/22/2017  . Neck pain on left side 09/06/2017  .  Cervicogenic headache 04/23/2017  . Preventative health care 03/23/2017  . Obstructive sleep apnea 01/25/2017  . Dehydration 12/08/2016  . Abnormal nuclear stress test 12/29/2013  . NSVT (nonsustained ventricular tachycardia) ([place]) 12/29/2013  . Essential hypertension 11/25/2013  . Hyperlipidemia 11/25/2013  . Insulin dependent diabetes mellitus ([place]) 11/25/2013  . Chest pain 11/25/2013  . Dyspnea on exertion 11/25/2013   Past Medical History:  Diagnosis Date  . Abnormal nuclear stress test 12/29/2013  . Allergy   . Arthritis   . Chest pain   . CHF (congestive heart failure) ([place])   . Chicken pox   . Colon polyps   . Coronary artery disease   . Depression   . Diabetes mellitus without complication ([place])   . Diverticulitis   . Dyspnea on exertion   . Heart murmur   . History of heart surgery   . Hyperlipidemia   . Hypertension   . Stroke [facility])     Family History  Problem Relation Age of Onset  . Heart failure Mother   . Heart disease Mother   . Mitral valve prolapse Mother        rheumatic fever, MVReplacement  . Alzheimer's disease Father   .  Breast cancer Sister        and sacrum cancer  . SIDS Daughter   . Hyperlipidemia Maternal Grandmother   . Miscarriages / Stillbirths Maternal Grandmother   . Hypertension Maternal Grandmother   . Hyperlipidemia Maternal Grandfather   . Miscarriages / Stillbirths Maternal Grandfather   . Hypertension Maternal Grandfather   . Hypertension Paternal Grandmother   . Hypertension Paternal Grandfather     Past Surgical History:  Procedure Laterality Date  . KNEE SURGERY    . LEFT HEART CATHETERIZATION WITH CORONARY ANGIOGRAM N/A 01/06/2014   Procedure: LEFT HEART CATHETERIZATION WITH CORONARY ANGIOGRAM;  Surgeon: Lorretta Harp, MD;  Location: [facility] CATH LAB;  Service: Cardiovascular;  Laterality: N/A;  . SHOULDER SURGERY    . TONSILLECTOMY     Social History   Occupational History  . Occupation: retired    Comment: former  Administrator  Tobacco Use  . Smoking status: Former Smoker    Packs/day: 1.50    Years: 43.00    Pack years: 64.50    Types: Cigarettes    Last attempt to quit: 05/22/1988    Years since quitting: 29.4  . Smokeless tobacco: Never Used  Substance and Sexual Activity  . Alcohol use: No  . Drug use: No  . Sexual activity: Not on file

## 2017-10-29 NOTE — Telephone Encounter (Signed)
I called and advised pt that this would be up to him as we can not make that call for him since we have not seen him. However, Dr. Jenny Reichmann did order the MRI of his C-spine and he should have reviewed it with him before making the referral, also Dr. Jenny Reichmann made a referral to Dr. Louanne Skye for the consult. Patient state the he would be here for his appt today.

## 2017-10-29 NOTE — Patient Instructions (Signed)
Avoid overhead lifting and overhead use of the arms. Do not lift greater than 5 lbs. Adjust head rest in vehicle to prevent hyperextension if rear ended. Take extra precautions to avoid falling.  

## 2017-11-13 ENCOUNTER — Telehealth: Payer: Self-pay | Admitting: Internal Medicine

## 2017-11-13 NOTE — Telephone Encounter (Signed)
Ok to go to 28 units of the lantus, cont all other diet and treatment, cont to monitor cbgs and watch for any low sugars less than about 100 especially with any low sugar symptoms

## 2017-11-13 NOTE — Telephone Encounter (Signed)
Called pt, LVM with details below  

## 2017-11-13 NOTE — Telephone Encounter (Signed)
Copied from [place] [id number]. Topic: Inquiry >> Nov 13, 2017 10:34 AM Scherrie Gerlach wrote: Reason for CRM: Daughter [name] for the pt. pt is on  metFORMIN (GLUCOPHAGE) 1000 MG tablet and Insulin Glargine (LANTUS SOLOSTAR) 100 UNIT/ML Solostar Pen and his blood sugars are still running high. Today is 193. Lowest has been 147. Readings running around 200. Pt wants to know if this is a regulating type insulin?  Pt just put on the insulin 5/14 and was told bs should be 70-100. Pt has increased as high as 25 units and daughter states she is afraid to go much higher. Would like to know what pt should do?

## 2017-11-13 NOTE — Telephone Encounter (Signed)
Dr John please advise.  

## 2017-12-04 ENCOUNTER — Telehealth: Payer: Self-pay | Admitting: Internal Medicine

## 2017-12-04 NOTE — Telephone Encounter (Signed)
I dont really see the need to call, but good to know about the change in insulin dosing.

## 2017-12-04 NOTE — Telephone Encounter (Signed)
Copied from [place] [phone]. Topic: Quick Communication - See Telephone Encounter >> Dec 04, 2017 10:16 AM Conception Chancy, NT wrote: CRM for notification. See Telephone encounter for: 12/04/17.  Patient is calling and states he needs Dr. Jenny Reichmann [facility] Ann[facility]. States he went up on his dosage to 28 units of Lantus.   [facility] [phone]

## 2017-12-05 NOTE — Telephone Encounter (Signed)
Noted  

## 2017-12-06 ENCOUNTER — Ambulatory Visit (INDEPENDENT_AMBULATORY_CARE_PROVIDER_SITE_OTHER): Payer: Medicare PPO | Admitting: Surgery

## 2017-12-06 ENCOUNTER — Ambulatory Visit (INDEPENDENT_AMBULATORY_CARE_PROVIDER_SITE_OTHER): Payer: Medicare PPO

## 2017-12-06 VITALS — BP 145/75 | HR 55 | Ht 71.0 in | Wt 245.0 lb

## 2017-12-06 DIAGNOSIS — M47812 Spondylosis without myelopathy or radiculopathy, cervical region: Secondary | ICD-10-CM | POA: Diagnosis not present

## 2017-12-06 DIAGNOSIS — M542 Cervicalgia: Secondary | ICD-10-CM

## 2017-12-06 NOTE — Progress Notes (Signed)
Office Visit Note   Patient: Aaron Maldonado           Date of Birth: May 15, 1946           MRN: [id number] Visit Date: 12/06/2017              Requested by: Biagio Borg, MD [place], [place] [address] PCP: Biagio Borg, MD   Assessment & Plan: Visit Diagnoses:  1. Neck pain   2. Spondylosis without myelopathy or radiculopathy, cervical region     Plan: Recommend the patient see Dr. Ernestina Patches to see what conservative treatments he recommends at this point.  Follow-up with Dr. Louanne Skye in a couple months for recheck.  Follow-Up Instructions: Return in about 2 months (around 02/06/2018) for With Dr. Louanne Skye.   Orders:  Orders Placed This Encounter  Procedures  . XR Cervical Spine 2 or 3 views  . Ambulatory referral to Physical Medicine Rehab   No orders of the defined types were placed in this encounter.     Procedures: No procedures performed   Clinical Data: No additional findings.   Subjective: Chief Complaint  Patient presents with  . Neck - Pain    HPI 72 year old male returns with complaints of neck pain.  States that neck pain is increased over the last week or so.  Not complaining of any upper extremity radicular symptoms.  Has had some off-and-on headaches.  Last seen by Dr. Louanne Skye October 29, 2017 and he had reviewed cervical spine MRI scan that was performed Sep 23, 2017. Review of Systems No current cardiac pulmonary GI GU issues  Objective: Vital Signs: BP (!) 145/75 (BP Location: Right Arm, Patient Position: Sitting, Cuff Size: Large)   Pulse (!) 55   Ht 5\' 11"  (1.803 m)   Wt 245 lb (111.1 kg)   BMI 34.17 kg/m   Physical Exam Constitutional:      Appearance: Normal appearance.  HENT:     Head: Normocephalic.  Eyes:     Extraocular Movements: Extraocular movements intact.     Pupils: Pupils are equal, round, and reactive to light.  Pulmonary:     Effort: No respiratory distress.  Musculoskeletal:     Comments: Bilateral brachial  plexus and trapezius tenderness.  No focal motor deficits.  Neurovas intact.  Skin:    General: Skin is warm and dry.  Neurological:     Mental Status: He is alert.     Ortho Exam  Specialty Comments:  No specialty comments available.  Imaging: No results found.   PMFS History: Patient Active Problem List   Diagnosis Date Noted  . CAD (coronary artery disease), native coronary artery 10/09/2017  . Near syncope 10/09/2017  . Rash 09/22/2017  . Neck pain on left side 09/06/2017  . Cervicogenic headache 04/23/2017  . Preventative health care 03/23/2017  . Obstructive sleep apnea 01/25/2017  . Dehydration 12/08/2016  . Abnormal nuclear stress test 12/29/2013  . NSVT (nonsustained ventricular tachycardia) ([place]) 12/29/2013  . Essential hypertension 11/25/2013  . Hyperlipidemia 11/25/2013  . Insulin dependent diabetes mellitus ([place]) 11/25/2013  . Chest pain 11/25/2013  . Dyspnea on exertion 11/25/2013   Past Medical History:  Diagnosis Date  . Abnormal nuclear stress test 12/29/2013  . Allergy   . Arthritis   . Chest pain   . CHF (congestive heart failure) ([place])   . Chicken pox   . Colon polyps   . Coronary artery disease   . Depression   .  Diabetes mellitus without complication ([place])   . Diverticulitis   . Dyspnea on exertion   . Heart murmur   . History of heart surgery   . Hyperlipidemia   . Hypertension   . Stroke [facility])     Family History  Problem Relation Age of Onset  . Heart failure Mother   . Heart disease Mother   . Mitral valve prolapse Mother        rheumatic fever, MVReplacement  . Alzheimer's disease Father   . Breast cancer Sister        and sacrum cancer  . SIDS Daughter   . Hyperlipidemia Maternal Grandmother   . Miscarriages / Stillbirths Maternal Grandmother   . Hypertension Maternal Grandmother   . Hyperlipidemia Maternal Grandfather   . Miscarriages / Stillbirths Maternal Grandfather   . Hypertension Maternal Grandfather   .  Hypertension Paternal Grandmother   . Hypertension Paternal Grandfather     Past Surgical History:  Procedure Laterality Date  . KNEE SURGERY    . LEFT HEART CATHETERIZATION WITH CORONARY ANGIOGRAM N/A 01/06/2014   Procedure: LEFT HEART CATHETERIZATION WITH CORONARY ANGIOGRAM;  Surgeon: Lorretta Harp, MD;  Location: [facility] CATH LAB;  Service: Cardiovascular;  Laterality: N/A;  . SHOULDER SURGERY    . TONSILLECTOMY     Social History   Occupational History  . Occupation: retired    Comment: former Administrator  Tobacco Use  . Smoking status: Former Smoker    Packs/day: 1.50    Years: 43.00    Pack years: 64.50    Types: Cigarettes    Last attempt to quit: 05/22/1988    Years since quitting: 29.5  . Smokeless tobacco: Never Used  Substance and Sexual Activity  . Alcohol use: No  . Drug use: No  . Sexual activity: Not on file

## 2017-12-10 ENCOUNTER — Other Ambulatory Visit: Payer: Self-pay | Admitting: Internal Medicine

## 2017-12-10 ENCOUNTER — Other Ambulatory Visit (INDEPENDENT_AMBULATORY_CARE_PROVIDER_SITE_OTHER): Payer: Self-pay | Admitting: Specialist

## 2017-12-10 MED ORDER — DICLOFENAC SODIUM 50 MG PO TBEC: 50.0000 mg | DELAYED_RELEASE_TABLET | Freq: Three times a day (TID) | ORAL | 0 refills | Status: DC

## 2017-12-10 MED ORDER — TRAMADOL HCL 50 MG PO TABS: 50.0000 mg | ORAL_TABLET | Freq: Four times a day (QID) | ORAL | 0 refills | Status: DC | PRN

## 2017-12-10 NOTE — Telephone Encounter (Signed)
Sent request to Dr. Nitka 

## 2017-12-10 NOTE — Telephone Encounter (Signed)
Patient called needing a refill on diclofenac & Tramadol.  Please call patient to advise [phone]

## 2017-12-10 NOTE — Telephone Encounter (Signed)
Called the patient and provided the [facility] pharmacy number.   [place]  They had a long wait time so I suggested to the patient for him to call and have the pharmacy request a prescription from [place] since this would be a new medication he will start getting through the [place] for a discounted rate compared to the his local pharmacy pricing. He agreed and will give them a call.

## 2017-12-10 NOTE — Telephone Encounter (Signed)
Copied from [place] [phone]. Topic: Quick Communication - Rx Refill/Question >> Dec 10, 2017  2:10 PM Robina Ade, Helene Kelp D wrote: Medication:Insulin Glargine (LANTUS SOLOSTAR) 100 UNIT/ML Solostar Pen  Has the patient contacted their pharmacy? Yes (Agent: If no, request that the patient contact the pharmacy for the refill.) (Agent: If yes, when and what did the pharmacy advise?)  Preferred Pharmacy (with phone number or street name): [facility] in [place], fax number [phone].  Agent: Please be advised that RX refills may take up to 3 business days. We ask that you follow-up with your pharmacy.

## 2017-12-10 NOTE — Telephone Encounter (Signed)
Patient called and states that he needs Dr. Jenny Reichmann to send in a new RX of his Lantus . He states Dr. Jenny Reichmann increased his dosage to 28 units and he states he will be out of the medication. He states that he gets his medication from the [place].  Patient does not know the name of the pharmacy or the fax number that he gets his medicine from . Please call patient if you have additional questions . CB# [phone]

## 2017-12-11 NOTE — Telephone Encounter (Signed)
Patient called about refill request and he says [facility] is too expensive and he never picked up from there. He says he would like to use the [facility] in [place] and he has 1 pen left that he is using now.  Lantus Pen Last OV:10/02/17 Last refill:10/02/17 15 ml/11 refill CAR:EQJE Pharmacy: [facility] [address] [place], [place]  [address] HQNET:[phone] KVQ:[phone]

## 2017-12-12 ENCOUNTER — Telehealth: Payer: Self-pay

## 2017-12-12 ENCOUNTER — Telehealth (INDEPENDENT_AMBULATORY_CARE_PROVIDER_SITE_OTHER): Payer: Self-pay | Admitting: Specialist

## 2017-12-12 MED ORDER — INSULIN GLARGINE 100 UNIT/ML SOLOSTAR PEN: 15.0000 [IU] | PEN_INJECTOR | Freq: Every day | SUBCUTANEOUS | 11 refills | Status: DC

## 2017-12-12 MED ORDER — INSULIN GLARGINE 100 UNIT/ML SOLOSTAR PEN: 28.0000 [IU] | PEN_INJECTOR | Freq: Every day | SUBCUTANEOUS | 11 refills | Status: DC

## 2017-12-12 NOTE — Telephone Encounter (Signed)
error 

## 2017-12-12 NOTE — Telephone Encounter (Signed)
We are not able to fax or efax rx to the alone standing [facility]. We are only allowed to efax or fax to the mail orders. You will have to print the rx for the patient and the patient will have to take it to the [facility] pharmacy. It is an inconvenience but that is the only way that we can rx with [place] pharmacies for now.

## 2017-12-12 NOTE — Telephone Encounter (Signed)
Patient notified and will pick up script.  Script at front desk.

## 2017-12-12 NOTE — Telephone Encounter (Signed)
Arbie Cookey at [facility] left voicemail and would like to know what type of pain patient has as to why the ultram was called in. Please call # [phone]

## 2017-12-14 ENCOUNTER — Telehealth (INDEPENDENT_AMBULATORY_CARE_PROVIDER_SITE_OTHER): Payer: Self-pay | Admitting: Specialist

## 2017-12-14 NOTE — Telephone Encounter (Signed)
I called and advised pt that I had to leave this info on the VM because they kept hanging up on me .

## 2017-12-14 NOTE — Telephone Encounter (Signed)
I called the pharm and advised M54.2-neck pain

## 2017-12-14 NOTE — Telephone Encounter (Signed)
Patient called advised he is out of Rx (Tramadol) and need the medication. Patient asked for a call back as soon as possible. Patient said the pharmacy also need to be contacted. [facility] [address] [place]. The number to contact patient is [phone]

## 2017-12-20 ENCOUNTER — Encounter (INDEPENDENT_AMBULATORY_CARE_PROVIDER_SITE_OTHER): Payer: Self-pay | Admitting: Physical Medicine and Rehabilitation

## 2017-12-20 ENCOUNTER — Ambulatory Visit (INDEPENDENT_AMBULATORY_CARE_PROVIDER_SITE_OTHER): Payer: Medicare PPO | Admitting: Physical Medicine and Rehabilitation

## 2017-12-20 ENCOUNTER — Telehealth (INDEPENDENT_AMBULATORY_CARE_PROVIDER_SITE_OTHER): Payer: Self-pay | Admitting: Physical Medicine and Rehabilitation

## 2017-12-20 VITALS — BP 168/91 | HR 61 | Temp 98.3°F | Ht 71.0 in | Wt 247.0 lb

## 2017-12-20 DIAGNOSIS — M961 Postlaminectomy syndrome, not elsewhere classified: Secondary | ICD-10-CM

## 2017-12-20 DIAGNOSIS — M5412 Radiculopathy, cervical region: Secondary | ICD-10-CM | POA: Diagnosis not present

## 2017-12-20 DIAGNOSIS — M542 Cervicalgia: Secondary | ICD-10-CM | POA: Diagnosis not present

## 2017-12-20 NOTE — Progress Notes (Signed)
 .  Numeric Pain Rating Scale and Functional Assessment Average Pain 8 Pain Right Now 7 My pain is constant and stabbing Pain is worse with: some activites Pain improves with: medication   In the last MONTH (on 0-10 scale) has pain interfered with the following?  1. General activity like being  able to carry out your everyday physical activities such as walking, climbing stairs, carrying groceries, or moving a chair?  Rating(5)  2. Relation with others like being able to carry out your usual social activities and roles such as  activities at home, at work and in your community. Rating(0)  3. Enjoyment of life such that you have  been bothered by emotional problems such as feeling anxious, depressed or irritable?  Rating(0)

## 2017-12-21 ENCOUNTER — Encounter (INDEPENDENT_AMBULATORY_CARE_PROVIDER_SITE_OTHER): Payer: Self-pay | Admitting: Physical Medicine and Rehabilitation

## 2017-12-21 NOTE — Telephone Encounter (Signed)
I did do his note

## 2017-12-21 NOTE — Telephone Encounter (Signed)
Needs auth

## 2017-12-21 NOTE — Progress Notes (Signed)
Aaron Maldonado - 72 y.o. male MRN [id number]  Date of birth: April 18, 1946  Office Visit Note: Visit Date: 12/20/2017 PCP: Biagio Borg, MD Referred by: Biagio Borg, MD  Subjective: Chief Complaint  Patient presents with  . Neck - Pain   HPI: Aaron Maldonado is a pleasant 72 year old right handed gentleman who is present with his wife today who provides some of the history.  He comes in today at the request of Dr. Basil Dess and his assistant Benjiman Core.  Patient has a chronic history of neck pain and pathology status post cervical fusion.  He reports essentially a 53-month or more history of neck pain.  His current episode of worsening symptoms began he was getting into his car and he was positioned back on the entry and hit his head against the car door sill. He has a history of previous surgery [place], [place] and was treated by Dr. Katharine Look. He underwent ACDF at C5-6.  He has had persistent pain since the injury. He is taking tramadol, he feels like the pain is better. He went to the [facility] off [address] in [place]. He had an injection that was painful and was given tramadol and methocarbamol for muscle spasm.  He denies any real radicular complaints.  Now has pain with extension of the neck, looking up in the parts department of [facility] he has pain and irritated he has pain with turning side to side. His pain really is more on the left than right side.  He rates his pain on average is an 8 out of 10 despite the use of pain medication.  He has not noted any focal weakness.  He has had MRI of the cervical spine and this is reviewed below.  MRI basically shows broad disc protrusion above his fusion more right-sided paracentral below the fusion by foraminal narrowing to 30 degrees really more right than left with some facet arthropathy.  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  No recent infections or history of urinary infections.    Review  of Systems  Constitutional: Negative for chills, fever, malaise/fatigue and weight loss.  HENT: Negative for hearing loss and sinus pain.   Eyes: Negative for blurred vision, double vision and photophobia.  Respiratory: Negative for cough and shortness of breath.   Cardiovascular: Negative for chest pain, palpitations and leg swelling.  Gastrointestinal: Negative for abdominal pain, nausea and vomiting.  Genitourinary: Negative for flank pain.  Musculoskeletal: Positive for neck pain. Negative for myalgias.  Skin: Negative for itching and rash.  Neurological: Negative for tremors, focal weakness and weakness.  Endo/Heme/Allergies: Negative.   Psychiatric/Behavioral: Negative for depression.  All other systems reviewed and are negative.  Otherwise per HPI.  Assessment & Plan: Visit Diagnoses:  1. Cervicalgia   2. Cervical radiculopathy   3. Post laminectomy syndrome     Plan: Findings:  Chronic history of neck pain status post remote cervical fusion at C5-6 with now disc protrusion above and below the level of fusion without real frank nerve compression the patient is having neck pain left more than right some referral in the shoulder at times but nothing down the arms per se.  Patient does get some pain with turning.  He gets some pain that radiates from the trapezius up into the occipital area of the head and even in the posterior auricular area.  I think he has 2 problems based on exam the MRI  is probably consistent with a cervical radicular pattern and he is getting myofascial pain with trigger points which I think is giving him more of a myotomal distribution up into the head.  I think the first step given the fact that he has had this ongoing and worsening now since 3 months prior we should complete cervical epidural injection just to see how much relief he gets diagnostically hopefully therapeutically.  This would be done with fluoroscopic guidance.  Depending on relief we will work at  trigger point injection versus regrouping with physical therapy for dry needling of the trigger points.  He will also continue to see Dr. Louanne Skye from a spine surgery standpoint.  He will continue with tramadol.    Meds & Orders: No orders of the defined types were placed in this encounter.  No orders of the defined types were placed in this encounter.   Follow-up: Return for Left C7-T1 interlaminr epidural steroid injection.   Procedures: No procedures performed  No notes on file   Clinical History: MRI CERVICAL SPINE WITHOUT CONTRAST  TECHNIQUE: Multiplanar, multisequence MR imaging of the cervical spine was performed. No intravenous contrast was administered.  COMPARISON:  None.  FINDINGS: Alignment: Physiologic.  Vertebrae: No fracture, evidence of discitis, or bone lesion.  Cord: Normal signal and morphology.  Posterior Fossa, vertebral arteries, paraspinal tissues: Posterior fossa demonstrates no focal abnormality. Vertebral artery flow voids are maintained. Paraspinal soft tissues are unremarkable.  Disc levels:  Discs: Anterior cervical fusion with osseous bridging across the C5-6 vertebral bodies. Degenerative disc disease with disc height loss at C6-7. Disc desiccation throughout the cervical spine.  C2-3: No significant disc bulge. No foraminal stenosis. Mild bilateral facet arthropathy. No central canal stenosis.  C3-4: Broad-based disc bulge. Moderate left and mild right foraminal stenosis. Moderate right facet arthropathy. No central canal stenosis.  C4-5: Broad central disc protrusion contacting the ventral cervical spinal cord. No neural foraminal stenosis. No central canal stenosis. Moderate right facet arthropathy.  C5-6: Interbody fusion. No neural foraminal stenosis. No central canal stenosis.  C6-7: Broad right paracentral disc protrusion. Moderate left foraminal stenosis. No right foraminal stenosis. No central  canal stenosis.  C7-T1: No significant disc bulge. No neural foraminal stenosis. No central canal stenosis.  T2-3: Small central disc protrusion. No foraminal or central canal stenosis.  IMPRESSION: 1. At C3-4 there is a broad-based disc bulge. Moderate left and mild right foraminal stenosis. Moderate right facet arthropathy. 2. At C6-7 there is a broad right paracentral disc protrusion. Moderate left foraminal stenosis. No right foraminal stenosis. 3. At C4-5 there is a broad central disc protrusion contacting the ventral cervical spinal cord. Moderate right facet arthropathy.   Electronically Signed   By: Kathreen Devoid   On: 09/24/2017 09:45   He reports that he quit smoking about 29 years ago. His smoking use included cigarettes. He has a 64.50 pack-year smoking history. He has never used smokeless tobacco.  Recent Labs    03/23/17 1441 09/21/17 1209  HGBA1C 8.2* 9.9*    Objective:  VS:  HT:5\' 11"  (180.3 cm)   WT:247 lb (112 kg)  BMI:34.46    BP:(!) 168/91  HR:61bpm  TEMP:98.3 F (36.8 C)(Oral)  RESP:95 % Physical Exam  Constitutional: He is oriented to person, place, and time. He appears well-developed and well-nourished. No distress.  HENT:  Head: Normocephalic and atraumatic.  Nose: Nose normal.  Mouth/Throat: Oropharynx is clear and moist.  Eyes: Pupils are equal, round, and reactive to light. Conjunctivae  are normal.  Neck: No JVD present. No tracheal deviation present.  Cardiovascular: Regular rhythm and intact distal pulses.  Pulmonary/Chest: Effort normal and breath sounds normal.  Abdominal: Soft. He exhibits no distension. There is no rebound and no guarding.  Musculoskeletal: He exhibits no deformity.  Cervical spine evaluation shows patient sitting with forward flexed cervical spine without increased kyphosis.  He does have focal trigger points in the longissimus and paraspinal musculature as well as trapezius and levator scapula.  These do  reproduce some of the pain.  He has pain with rotation to the left more than right and pain with extension.  Is consistent with facet joint loading.  He has negative Spurling's test bilaterally.  He has some shoulder impingement bilaterally but not concordant pain.  He has good strength in the upper extremities bilaterally without deficit.  He has 2+ muscle stretch reflexes of the biceps and brachioradialis bilaterally.  He is somewhat hyperreflexive on the left compared to right but is mild.  He has a negative Hoffman's test bilaterally.  Neurological: He is alert and oriented to person, place, and time. He exhibits normal muscle tone. Coordination normal.  Skin: Skin is warm. No rash noted.  Psychiatric: He has a normal mood and affect. His behavior is normal.  Nursing note and vitals reviewed.   Ortho Exam Imaging: No results found.  Past Medical/Family/Surgical/Social History: Medications & Allergies reviewed per EMR, new medications updated. Patient Active Problem List   Diagnosis Date Noted  . CAD (coronary artery disease), native coronary artery 10/09/2017  . Near syncope 10/09/2017  . Rash 09/22/2017  . Neck pain on left side 09/06/2017  . Cervicogenic headache 04/23/2017  . Preventative health care 03/23/2017  . Obstructive sleep apnea 01/25/2017  . Dehydration 12/08/2016  . Abnormal nuclear stress test 12/29/2013  . NSVT (nonsustained ventricular tachycardia) ([place]) 12/29/2013  . Essential hypertension 11/25/2013  . Hyperlipidemia 11/25/2013  . Insulin dependent diabetes mellitus ([place]) 11/25/2013  . Chest pain 11/25/2013  . Dyspnea on exertion 11/25/2013   Past Medical History:  Diagnosis Date  . Abnormal nuclear stress test 12/29/2013  . Allergy   . Arthritis   . Chest pain   . CHF (congestive heart failure) ([place])   . Chicken pox   . Colon polyps   . Coronary artery disease   . Depression   . Diabetes mellitus without complication ([place])   . Diverticulitis   .  Dyspnea on exertion   . Heart murmur   . History of heart surgery   . Hyperlipidemia   . Hypertension   . Stroke [facility])    Family History  Problem Relation Age of Onset  . Heart failure Mother   . Heart disease Mother   . Mitral valve prolapse Mother        rheumatic fever, MVReplacement  . Alzheimer's disease Father   . Breast cancer Sister        and sacrum cancer  . SIDS Daughter   . Hyperlipidemia Maternal Grandmother   . Miscarriages / Stillbirths Maternal Grandmother   . Hypertension Maternal Grandmother   . Hyperlipidemia Maternal Grandfather   . Miscarriages / Stillbirths Maternal Grandfather   . Hypertension Maternal Grandfather   . Hypertension Paternal Grandmother   . Hypertension Paternal Grandfather    Past Surgical History:  Procedure Laterality Date  . KNEE SURGERY    . LEFT HEART CATHETERIZATION WITH CORONARY ANGIOGRAM N/A 01/06/2014   Procedure: LEFT HEART CATHETERIZATION WITH CORONARY ANGIOGRAM;  Surgeon: Pearletha Forge  Gwenlyn Found, MD;  Location: [facility] CATH LAB;  Service: Cardiovascular;  Laterality: N/A;  . SHOULDER SURGERY    . TONSILLECTOMY     Social History   Occupational History  . Occupation: retired    Comment: former Administrator  Tobacco Use  . Smoking status: Former Smoker    Packs/day: 1.50    Years: 43.00    Pack years: 64.50    Types: Cigarettes    Last attempt to quit: 05/22/1988    Years since quitting: 29.6  . Smokeless tobacco: Never Used  Substance and Sexual Activity  . Alcohol use: No  . Drug use: No  . Sexual activity: Not on file

## 2017-12-21 NOTE — Telephone Encounter (Signed)
Auth No / Request ID  [id number]  Status  Auto-Approved  Decision  Approved  Effective Date  12/24/2017  Expiration Date  02/07/2018

## 2018-01-03 ENCOUNTER — Encounter (INDEPENDENT_AMBULATORY_CARE_PROVIDER_SITE_OTHER): Payer: Medicare PPO | Admitting: Physical Medicine and Rehabilitation

## 2018-01-08 ENCOUNTER — Telehealth: Payer: Self-pay | Admitting: Internal Medicine

## 2018-01-08 ENCOUNTER — Other Ambulatory Visit (INDEPENDENT_AMBULATORY_CARE_PROVIDER_SITE_OTHER): Payer: Self-pay | Admitting: Specialist

## 2018-01-08 NOTE — Telephone Encounter (Signed)
Sent request to Dr. Nitka 

## 2018-01-08 NOTE — Telephone Encounter (Signed)
Patient called to request a refill on TRAMADOL.  Please give patient call to advise. [phone]

## 2018-01-08 NOTE — Telephone Encounter (Signed)
Called pt and informed him to call Dr Louanne Skye for refill of the Tramadol.

## 2018-01-08 NOTE — Telephone Encounter (Signed)
Copied from [place] [id number]. Topic: Quick Communication - Rx Refill/Question >> Jan 08, 2018 10:24 AM Sheran Luz wrote: Medication: traMADol (ULTRAM) 50 MG tablet   Pt states that Dr. Jenny Reichmann was the first provider to prescribe him this medication and is requesting a refill.   Preferred Pharmacy (with phone number or street name): [facility] [phone] (Phone) [phone] (Fax)    Agent: Please be advised that RX refills may take up to 3 business days. We ask that you follow-up with your pharmacy.

## 2018-01-09 MED ORDER — TRAMADOL HCL 50 MG PO TABS: 50.0000 mg | ORAL_TABLET | Freq: Four times a day (QID) | ORAL | 0 refills | Status: DC | PRN

## 2018-01-09 NOTE — Telephone Encounter (Signed)
Patient called in regard to refill of Tramadol.  Please call patient to advise

## 2018-01-10 NOTE — Telephone Encounter (Signed)
Ok to rf? 

## 2018-01-11 ENCOUNTER — Other Ambulatory Visit (INDEPENDENT_AMBULATORY_CARE_PROVIDER_SITE_OTHER): Payer: Self-pay | Admitting: Specialist

## 2018-01-11 MED ORDER — TRAMADOL HCL 50 MG PO TABS: 50.0000 mg | ORAL_TABLET | Freq: Four times a day (QID) | ORAL | 0 refills | Status: DC | PRN

## 2018-03-25 ENCOUNTER — Ambulatory Visit (INDEPENDENT_AMBULATORY_CARE_PROVIDER_SITE_OTHER): Payer: Medicare PPO | Admitting: Internal Medicine

## 2018-03-25 ENCOUNTER — Encounter: Payer: Self-pay | Admitting: Internal Medicine

## 2018-03-25 VITALS — BP 124/76 | HR 60 | Temp 98.0°F | Ht 71.0 in | Wt 251.0 lb

## 2018-03-25 DIAGNOSIS — I10 Essential (primary) hypertension: Secondary | ICD-10-CM | POA: Diagnosis not present

## 2018-03-25 DIAGNOSIS — E119 Type 2 diabetes mellitus without complications: Secondary | ICD-10-CM | POA: Diagnosis not present

## 2018-03-25 DIAGNOSIS — Z794 Long term (current) use of insulin: Secondary | ICD-10-CM | POA: Diagnosis not present

## 2018-03-25 DIAGNOSIS — Z0000 Encounter for general adult medical examination without abnormal findings: Secondary | ICD-10-CM | POA: Diagnosis not present

## 2018-03-25 DIAGNOSIS — IMO0001 Reserved for inherently not codable concepts without codable children: Secondary | ICD-10-CM

## 2018-03-25 DIAGNOSIS — E1142 Type 2 diabetes mellitus with diabetic polyneuropathy: Secondary | ICD-10-CM | POA: Insufficient documentation

## 2018-03-25 MED ORDER — FREESTYLE LIBRE 14 DAY READER DEVI: 1.0000 | Freq: Four times a day (QID) | 0 refills | Status: AC

## 2018-03-25 MED ORDER — FREESTYLE LIBRE SENSOR SYSTEM MISC: 1.0000 | 3 refills | Status: AC

## 2018-03-25 NOTE — Assessment & Plan Note (Signed)
stable overall by history and exam, recent data reviewed with pt, and pt to continue medical treatment as before,  to f/u any worsening symptoms or concerns, declines labs today 

## 2018-03-25 NOTE — Patient Instructions (Addendum)
You had the flu shot today  Your glucometer and sensor prescriptions were sent to the pharmacy  Please continue all other medications as before, and refills have been done if requested.  Please have the pharmacy call with any other refills you may need.  Please continue your efforts at being more active, low cholesterol diet, and weight control.  You are otherwise up to date with prevention measures today.  Please keep your appointments with your specialists as you may have planned  Please bring your [facility] labwork results to your next visit if possible  Please return in 6 months, or sooner if needed

## 2018-03-25 NOTE — Assessment & Plan Note (Signed)

## 2018-03-25 NOTE — Progress Notes (Signed)
Subjective:    Patient ID: Aaron Maldonado, male    DOB: 09/28/1945, 72 y.o.   MRN: [id number]  HPI   Here for wellness and f/u;  Overall doing ok;  Pt denies Chest pain, worsening SOB, DOE, wheezing, orthopnea, PND, worsening LE edema, palpitations, dizziness or syncope.  Pt denies neurological change such as new headache, facial or extremity weakness.  Pt denies polydipsia, polyuria, or low sugar symptoms. Pt states overall good compliance with treatment and medications, good tolerability, and has been trying to follow appropriate diet.  Pt denies worsening depressive symptoms, suicidal ideation or panic. No fever, night sweats, wt loss, loss of appetite, or other constitutional symptoms.  Pt states good ability with ADL's, has low fall risk, home safety reviewed and adequate, no other significant changes in hearing or vision, and only occasionally active with exercises.  Due for xrays tomorrow at [facility] per pt, s/p report per pt of injury sept 26.   Also to see GI tomorrow to have colon screening.  Last a1c just last wk at [facility] about 8 and meds adjusted.  Declines psa or other labs here today.  Asks for [employer] type glucometer to use Wt Readings from Last 3 Encounters:  03/25/18 251 lb (113.9 kg)  12/20/17 247 lb (112 kg)  12/06/17 245 lb (111.1 kg)   Past Medical History:  Diagnosis Date  . Abnormal nuclear stress test 12/29/2013  . Allergy   . Arthritis   . Chest pain   . CHF (congestive heart failure) ([place])   . Chicken pox   . Colon polyps   . Coronary artery disease   . Depression   . Diabetes mellitus without complication ([place])   . Diverticulitis   . Dyspnea on exertion   . Heart murmur   . History of heart surgery   . Hyperlipidemia   . Hypertension   . Stroke [facility])    Past Surgical History:  Procedure Laterality Date  . KNEE SURGERY    . LEFT HEART CATHETERIZATION WITH CORONARY ANGIOGRAM N/A 01/06/2014   Procedure: LEFT HEART CATHETERIZATION WITH CORONARY ANGIOGRAM;   Surgeon: Lorretta Harp, MD;  Location: [facility] CATH LAB;  Service: Cardiovascular;  Laterality: N/A;  . SHOULDER SURGERY    . TONSILLECTOMY      reports that he quit smoking about 29 years ago. His smoking use included cigarettes. He has a 64.50 pack-year smoking history. He has never used smokeless tobacco. He reports that he does not drink alcohol or use drugs. family history includes Alzheimer's disease in his father; Breast cancer in his sister; Heart disease in his mother; Heart failure in his mother; Hyperlipidemia in his maternal grandfather and maternal grandmother; Hypertension in his maternal grandfather, maternal grandmother, paternal grandfather, and paternal grandmother; 34 / [place] in his maternal grandfather and maternal grandmother; Mitral valve prolapse in his mother; SIDS in his daughter. Allergies  Allergen Reactions  . Keflex [Cephalexin]     unknown  . Lisinopril Cough  . Penicillins Rash    Has patient had a PCN reaction causing immediate rash, facial/tongue/throat swelling, SOB or lightheadedness with hypotension: No Has patient had a PCN reaction causing severe rash involving mucus membranes or skin necrosis: No Has patient had a PCN reaction that required hospitalization: No Has patient had a PCN reaction occurring within the last 10 years: No If all of the above answers are "NO", then may proceed with Cephalosporin use.   . Sulfa Antibiotics Rash    rash  .  Sulfasalazine Rash    rash   Current Outpatient Medications on File Prior to Visit  Medication Sig Dispense Refill  . allopurinol (ZYLOPRIM) 300 MG tablet Take 300 mg by mouth daily.    Marland Kitchen amLODipine (NORVASC) 5 MG tablet Take 5 mg daily by mouth.    Marland Kitchen aspirin 81 MG tablet Take 81 mg by mouth daily.    . calcium carbonate (OS-CAL) 600 MG TABS tablet Take 600 mg by mouth daily.    . carvedilol (COREG) 3.125 MG tablet Take 1 tablet (3.125 mg total) by mouth daily. 90 tablet 4  . cetirizine (ZYRTEC)  10 MG tablet Take 10 mg by mouth daily.    . diclofenac (VOLTAREN) 50 MG EC tablet Take 1 tablet (50 mg total) by mouth 3 (three) times daily. 90 tablet 0  . furosemide (LASIX) 40 MG tablet Take 40 mg by mouth.    . gabapentin (NEURONTIN) 300 MG capsule Take 600 mg by mouth 2 (two) times daily.     Marland Kitchen glipiZIDE (GLUCOTROL XL) 10 MG 24 hr tablet TAKE 1 TABLET BY MOUTH ONCE DAILY WITH BREAKFAST  3  . HYDROcodone-acetaminophen (NORCO/VICODIN) 5-325 MG tablet Take 1 tablet by mouth 2 (two) times daily as needed for moderate pain. 14 tablet 0  . Insulin Glargine (LANTUS SOLOSTAR) 100 UNIT/ML Solostar Pen Inject 28 Units into the skin daily at 10 pm. 15 mL 11  . Insulin Pen Needle (BD PEN NEEDLE NANO U/F) 32G X 4 MM MISC 1 with pen use daily 100 each 3  . linagliptin (TRADJENTA) 5 MG TABS tablet Take 1 tablet (5 mg total) by mouth daily. 90 tablet 3  . metFORMIN (GLUCOPHAGE) 1000 MG tablet Take 1,000 mg by mouth 2 (two) times daily with a meal.    . nitroGLYCERIN (NITROSTAT) 0.4 MG SL tablet Place 1 tablet (0.4 mg total) under the tongue every 5 (five) minutes as needed for chest pain. 90 tablet 3  . omeprazole (PRILOSEC) 20 MG capsule Take 20 mg by mouth 2 (two) times daily before a meal.    . potassium chloride (KLOR-CON) 8 MEQ tablet Take 8 mEq by mouth daily.    Marland Kitchen rOPINIRole (REQUIP) 3 MG tablet Take 3 mg by mouth at bedtime.    . rosuvastatin (CRESTOR) 10 MG tablet Take 1 tablet (10 mg total) by mouth daily. 90 tablet 3  . traMADol (ULTRAM) 50 MG tablet Take 1 tablet (50 mg total) by mouth every 6 (six) hours as needed. 30 tablet 0  . triamcinolone cream (KENALOG) 0.1 % Apply 1 application topically 2 (two) times daily. 30 g 0   No current facility-administered medications on file prior to visit.    Review of Systems Constitutional: Negative for other unusual diaphoresis, sweats, appetite or weight changes HENT: Negative for other worsening hearing loss, ear pain, facial swelling, mouth sores or  neck stiffness.   Eyes: Negative for other worsening pain, redness or other visual disturbance.  Respiratory: Negative for other stridor or swelling Cardiovascular: Negative for other palpitations or other chest pain  Gastrointestinal: Negative for worsening diarrhea or loose stools, blood in stool, distention or other pain Genitourinary: Negative for hematuria, flank pain or other change in urine volume.  Musculoskeletal: Negative for myalgias or other joint swelling.  Skin: Negative for other color change, or other wound or worsening drainage.  Neurological: Negative for other syncope or numbness. Hematological: Negative for other adenopathy or swelling Psychiatric/Behavioral: Negative for hallucinations, other worsening agitation, SI, self-injury, or new decreased  concentration All other system neg per pt    Objective:   Physical Exam BP 124/76   Pulse 60   Temp 98 F (36.7 C) (Oral)   Ht 5\' 11"  (1.803 m)   Wt 251 lb (113.9 kg)   SpO2 94%   BMI 35.01 kg/m  VS noted,  Constitutional: Pt is oriented to person, place, and time. Appears well-developed and well-nourished, in no significant distress and comfortable Head: Normocephalic and atraumatic  Eyes: Conjunctivae and EOM are normal. Pupils are equal, round, and reactive to light Right Ear: External ear normal without discharge Left Ear: External ear normal without discharge Nose: Nose without discharge or deformity Mouth/Throat: Oropharynx is without other ulcerations and moist  Neck: Normal range of motion. Neck supple. No JVD present. No tracheal deviation present or significant neck LA or mass Cardiovascular: Normal rate, regular rhythm, normal heart sounds and intact distal pulses.   Pulmonary/Chest: WOB normal and breath sounds without rales or wheezing  Abdominal: Soft. Bowel sounds are normal. NT. No HSM  Musculoskeletal: Normal range of motion. Exhibits chronic 1+ edema to mid lower legs Lymphadenopathy: Has no other  cervical adenopathy.  Neurological: Pt is alert and oriented to person, place, and time. Pt has normal reflexes. No cranial nerve deficit. Motor grossly intact, Gait intact Skin: Skin is warm and dry. No rash noted or new ulcerations Psychiatric:  Has normal mood and affect. Behavior is normal without agitation No other exam findings Lab Results  Component Value Date   WBC 8.5 03/23/2017   HGB 14.8 03/23/2017   HCT 43.6 03/23/2017   PLT 172.0 03/23/2017   GLUCOSE 357 (H) 09/21/2017   CHOL 134 09/21/2017   TRIG 318.0 (H) 09/21/2017   HDL 31.40 (L) 09/21/2017   LDLDIRECT 76.0 09/21/2017   LDLCALC 57 12/01/2013   ALT 12 09/21/2017   AST 13 09/21/2017   NA 136 09/21/2017   K 4.3 09/21/2017   CL 98 09/21/2017   CREATININE 1.11 09/21/2017   BUN 14 09/21/2017   CO2 30 09/21/2017   TSH 3.08 03/23/2017   PSA 2.71 03/23/2017   INR 0.93 12/29/2013   HGBA1C 9.9 (H) 09/21/2017   MICROALBUR 1.1 03/23/2017       Assessment & Plan:

## 2018-03-25 NOTE — Assessment & Plan Note (Signed)
stable overall by history and exam, recent data reviewed with pt, and pt to continue medical treatment as before,  to f/u any worsening symptoms or concerns  

## 2018-05-23 DIAGNOSIS — M9903 Segmental and somatic dysfunction of lumbar region: Secondary | ICD-10-CM | POA: Diagnosis not present

## 2018-05-23 DIAGNOSIS — M5408 Panniculitis affecting regions of neck and back, sacral and sacrococcygeal region: Secondary | ICD-10-CM | POA: Diagnosis not present

## 2018-05-23 DIAGNOSIS — M62830 Muscle spasm of back: Secondary | ICD-10-CM | POA: Diagnosis not present

## 2018-06-20 DIAGNOSIS — M62830 Muscle spasm of back: Secondary | ICD-10-CM | POA: Diagnosis not present

## 2018-06-20 DIAGNOSIS — M5408 Panniculitis affecting regions of neck and back, sacral and sacrococcygeal region: Secondary | ICD-10-CM | POA: Diagnosis not present

## 2018-06-20 DIAGNOSIS — M9903 Segmental and somatic dysfunction of lumbar region: Secondary | ICD-10-CM | POA: Diagnosis not present

## 2018-06-26 DIAGNOSIS — B9789 Other viral agents as the cause of diseases classified elsewhere: Secondary | ICD-10-CM | POA: Diagnosis not present

## 2018-06-26 DIAGNOSIS — Z20828 Contact with and (suspected) exposure to other viral communicable diseases: Secondary | ICD-10-CM | POA: Diagnosis not present

## 2018-06-26 DIAGNOSIS — J069 Acute upper respiratory infection, unspecified: Secondary | ICD-10-CM | POA: Diagnosis not present

## 2018-09-11 ENCOUNTER — Telehealth: Payer: Self-pay | Admitting: Cardiovascular Disease

## 2018-09-11 NOTE — Telephone Encounter (Signed)
LVM to call and schedule followup.  °

## 2018-09-23 ENCOUNTER — Ambulatory Visit (INDEPENDENT_AMBULATORY_CARE_PROVIDER_SITE_OTHER): Payer: Medicare HMO | Admitting: Internal Medicine

## 2018-09-23 ENCOUNTER — Encounter: Payer: Self-pay | Admitting: Internal Medicine

## 2018-09-23 DIAGNOSIS — E7800 Pure hypercholesterolemia, unspecified: Secondary | ICD-10-CM | POA: Diagnosis not present

## 2018-09-23 DIAGNOSIS — E119 Type 2 diabetes mellitus without complications: Secondary | ICD-10-CM

## 2018-09-23 DIAGNOSIS — Z0000 Encounter for general adult medical examination without abnormal findings: Secondary | ICD-10-CM | POA: Diagnosis not present

## 2018-09-23 DIAGNOSIS — I10 Essential (primary) hypertension: Secondary | ICD-10-CM

## 2018-09-23 DIAGNOSIS — Z794 Long term (current) use of insulin: Secondary | ICD-10-CM | POA: Diagnosis not present

## 2018-09-23 DIAGNOSIS — IMO0001 Reserved for inherently not codable concepts without codable children: Secondary | ICD-10-CM

## 2018-09-23 MED ORDER — ROPINIROLE HCL 3 MG PO TABS: 9.0000 mg | ORAL_TABLET | Freq: Every day | ORAL | 11 refills | Status: DC

## 2018-09-23 MED ORDER — GLIPIZIDE ER 10 MG PO TB24: ORAL_TABLET | ORAL | 3 refills | Status: DC

## 2018-09-23 NOTE — Patient Instructions (Signed)
Please continue all other medications as before, and refills have been done if requested.  Please have the pharmacy call with any other refills you may need.  Please continue your efforts at being more active, low cholesterol diet, and weight control.  You are otherwise up to date with prevention measures today.  Please keep your appointments with your specialists as you may have planned  You will be contacted regarding the referral for: colonoscopy  Please go to the LAB in the Basement (turn left off the elevator) for the tests to be done today  You will be contacted by phone if any changes need to be made immediately.  Otherwise, you will receive a letter about your results with an explanation, but please check with MyChart first.  Please remember to sign up for MyChart if you have not done so, as this will be important to you in the future with finding out test results, communicating by private email, and scheduling acute appointments online when needed.  Please return in 6 months, or sooner if needed, with Lab testing done 3-5 days before

## 2018-09-23 NOTE — Assessment & Plan Note (Signed)
stable overall by history and exam, recent data reviewed with pt, and pt to continue medical treatment as before,  to f/u any worsening symptoms or concerns, to check BP at home and next visit

## 2018-09-23 NOTE — Progress Notes (Signed)
Patient ID: Aaron Maldonado, male   DOB: 12-14-1945, 73 y.o.   MRN: [id number]  Virtual Visit via Video Note  I connected with Aaron Maldonado on 09/23/18 at  3:40 PM EDT by a video enabled telemedicine application and verified that I am speaking with the correct person using two identifiers.  Location: Patient: at home Provider: at office   I discussed the limitations of evaluation and management by telemedicine and the availability of in person appointments. The patient expressed understanding and agreed to proceed.  History of Present Illness: Here for wellness and f/u;  Overall doing ok;  Pt denies Chest pain, worsening SOB, DOE, wheezing, orthopnea, PND, worsening LE edema, palpitations, dizziness or syncope.  Pt denies neurological change such as new headache, facial or extremity weakness.  Pt denies polydipsia, polyuria, or low sugar symptoms. Pt states overall good compliance with treatment and medications, good tolerability, and has been trying to follow appropriate diet.  Pt denies worsening depressive symptoms, suicidal ideation or panic. No fever, night sweats, wt loss, loss of appetite, or other constitutional symptoms.  Pt states good ability with ADL's, has low fall risk, home safety reviewed and adequate, no other significant changes in hearing or vision, and only occasionally active with exercise.  Wt overall stable per pt, cbgs in low to mid 100's usually in the AM.  Pt continues to have recurring LBP without change in severity, bowel or bladder change, fever, wt loss,  worsening LE pain/numbness/weakness, gait change or falls, and seeing chiropracter.  Due for colonoscopy BP Readings from Last 3 Encounters:  03/25/18 124/76  12/20/17 (!) 168/91  12/06/17 (!) 145/75   Wt Readings from Last 3 Encounters:  03/25/18 251 lb (113.9 kg)  12/20/17 247 lb (112 kg)  12/06/17 245 lb (111.1 kg)   Past Medical History:  Diagnosis Date  . Abnormal nuclear stress test 12/29/2013  .  Allergy   . Arthritis   . Chest pain   . CHF (congestive heart failure) ([place])   . Chicken pox   . Colon polyps   . Coronary artery disease   . Depression   . Diabetes mellitus without complication ([place])   . Diverticulitis   . Dyspnea on exertion   . Heart murmur   . History of heart surgery   . Hyperlipidemia   . Hypertension   . Stroke [facility])    Past Surgical History:  Procedure Laterality Date  . KNEE SURGERY    . LEFT HEART CATHETERIZATION WITH CORONARY ANGIOGRAM N/A 01/06/2014   Procedure: LEFT HEART CATHETERIZATION WITH CORONARY ANGIOGRAM;  Surgeon: Lorretta Harp, MD;  Location: [facility] CATH LAB;  Service: Cardiovascular;  Laterality: N/A;  . SHOULDER SURGERY    . TONSILLECTOMY      reports that he quit smoking about 30 years ago. His smoking use included cigarettes. He has a 64.50 pack-year smoking history. He has never used smokeless tobacco. He reports that he does not drink alcohol or use drugs. family history includes Alzheimer's disease in his father; Breast cancer in his sister; Heart disease in his mother; Heart failure in his mother; Hyperlipidemia in his maternal grandfather and maternal grandmother; Hypertension in his maternal grandfather, maternal grandmother, paternal grandfather, and paternal grandmother; 57 / [place] in his maternal grandfather and maternal grandmother; Mitral valve prolapse in his mother; SIDS in his daughter. Allergies  Allergen Reactions  . Keflex [Cephalexin]     unknown  . Lisinopril Cough  . Penicillins Rash    Has patient had a  PCN reaction causing immediate rash, facial/tongue/throat swelling, SOB or lightheadedness with hypotension: No Has patient had a PCN reaction causing severe rash involving mucus membranes or skin necrosis: No Has patient had a PCN reaction that required hospitalization: No Has patient had a PCN reaction occurring within the last 10 years: No If all of the above answers are "NO", then may proceed with  Cephalosporin use.   . Sulfa Antibiotics Rash    rash  . Sulfasalazine Rash    rash   Current Outpatient Medications on File Prior to Visit  Medication Sig Dispense Refill  . allopurinol (ZYLOPRIM) 300 MG tablet Take 300 mg by mouth daily.    Marland Kitchen amLODipine (NORVASC) 5 MG tablet Take 5 mg daily by mouth.    Marland Kitchen aspirin 81 MG tablet Take 81 mg by mouth daily.    . calcium carbonate (OS-CAL) 600 MG TABS tablet Take 600 mg by mouth daily.    . carvedilol (COREG) 3.125 MG tablet Take 1 tablet (3.125 mg total) by mouth daily. 90 tablet 4  . cetirizine (ZYRTEC) 10 MG tablet Take 10 mg by mouth daily.    . Continuous Blood Gluc Receiver (FREESTYLE LIBRE 14 DAY READER) DEVI 1 Device by Does not apply route 4 (four) times daily. E11.9 1 Device 0  . Continuous Blood Gluc Sensor (FREESTYLE LIBRE SENSOR SYSTEM) MISC 1 Device by Does not apply route every 14 (fourteen) days. E11.9 6 each 3  . diclofenac (VOLTAREN) 50 MG EC tablet Take 1 tablet (50 mg total) by mouth 3 (three) times daily. 90 tablet 0  . furosemide (LASIX) 40 MG tablet Take 40 mg by mouth.    . gabapentin (NEURONTIN) 300 MG capsule Take 600 mg by mouth 2 (two) times daily.     Marland Kitchen HYDROcodone-acetaminophen (NORCO/VICODIN) 5-325 MG tablet Take 1 tablet by mouth 2 (two) times daily as needed for moderate pain. 14 tablet 0  . Insulin Glargine (LANTUS SOLOSTAR) 100 UNIT/ML Solostar Pen Inject 28 Units into the skin daily at 10 pm. 15 mL 11  . Insulin Pen Needle (BD PEN NEEDLE NANO U/F) 32G X 4 MM MISC 1 with pen use daily 100 each 3  . linagliptin (TRADJENTA) 5 MG TABS tablet Take 1 tablet (5 mg total) by mouth daily. 90 tablet 3  . metFORMIN (GLUCOPHAGE) 1000 MG tablet Take 1,000 mg by mouth 2 (two) times daily with a meal.    . nitroGLYCERIN (NITROSTAT) 0.4 MG SL tablet Place 1 tablet (0.4 mg total) under the tongue every 5 (five) minutes as needed for chest pain. 90 tablet 3  . omeprazole (PRILOSEC) 20 MG capsule Take 20 mg by mouth 2 (two)  times daily before a meal.    . potassium chloride (KLOR-CON) 8 MEQ tablet Take 8 mEq by mouth daily.    . rosuvastatin (CRESTOR) 10 MG tablet Take 1 tablet (10 mg total) by mouth daily. 90 tablet 3  . traMADol (ULTRAM) 50 MG tablet Take 1 tablet (50 mg total) by mouth every 6 (six) hours as needed. 30 tablet 0   No current facility-administered medications on file prior to visit.     Observations/Objective: Alert, NAD, appropriate mood and affect, resps normal, cn 2-12 intact, moves all 4s, no visible rash or swelling  Lab Results  Component Value Date   WBC 8.5 03/23/2017   HGB 14.8 03/23/2017   HCT 43.6 03/23/2017   PLT 172.0 03/23/2017   GLUCOSE 357 (H) 09/21/2017   CHOL 134 09/21/2017  TRIG 318.0 (H) 09/21/2017   HDL 31.40 (L) 09/21/2017   LDLDIRECT 76.0 09/21/2017   LDLCALC 57 12/01/2013   ALT 12 09/21/2017   AST 13 09/21/2017   NA 136 09/21/2017   K 4.3 09/21/2017   CL 98 09/21/2017   CREATININE 1.11 09/21/2017   BUN 14 09/21/2017   CO2 30 09/21/2017   TSH 3.08 03/23/2017   PSA 2.71 03/23/2017   INR 0.93 12/29/2013   HGBA1C 9.9 (H) 09/21/2017   MICROALBUR 1.1 03/23/2017    Assessment and Plan: See notes  Follow Up Instructions: See notes   I discussed the assessment and treatment plan with the patient. The patient was provided an opportunity to ask questions and all were answered. The patient agreed with the plan and demonstrated an understanding of the instructions.   The patient was advised to call back or seek an in-person evaluation if the symptoms worsen or if the condition fails to improve as anticipated.   Cathlean Cower, MD

## 2018-09-23 NOTE — Assessment & Plan Note (Signed)

## 2018-09-23 NOTE — Assessment & Plan Note (Signed)
stable overall by history and exam, recent data reviewed with pt, and pt to continue medical treatment as before,  to f/u any worsening symptoms or concerns, for lipids with albs

## 2018-09-23 NOTE — Assessment & Plan Note (Signed)
stable overall by history and exam, recent data reviewed with pt, and pt to continue medical treatment as before,  to f/u any worsening symptoms or concerns, for a1c with labs 

## 2018-09-24 ENCOUNTER — Other Ambulatory Visit (INDEPENDENT_AMBULATORY_CARE_PROVIDER_SITE_OTHER): Payer: Medicare HMO

## 2018-09-24 DIAGNOSIS — Z794 Long term (current) use of insulin: Secondary | ICD-10-CM

## 2018-09-24 DIAGNOSIS — E119 Type 2 diabetes mellitus without complications: Secondary | ICD-10-CM | POA: Diagnosis not present

## 2018-09-24 DIAGNOSIS — Z0000 Encounter for general adult medical examination without abnormal findings: Secondary | ICD-10-CM

## 2018-09-24 DIAGNOSIS — Z125 Encounter for screening for malignant neoplasm of prostate: Secondary | ICD-10-CM | POA: Diagnosis not present

## 2018-09-24 DIAGNOSIS — IMO0001 Reserved for inherently not codable concepts without codable children: Secondary | ICD-10-CM

## 2018-09-24 LAB — HEPATIC FUNCTION PANEL
ALT: 15 U/L (ref 0–53)
AST: 16 U/L (ref 0–37)
Albumin: 4 g/dL (ref 3.5–5.2)
Alkaline Phosphatase: 78 U/L (ref 39–117)
Bilirubin, Direct: 0.2 mg/dL (ref 0.0–0.3)
Total Bilirubin: 1.3 mg/dL — ABNORMAL HIGH (ref 0.2–1.2)
Total Protein: 6.4 g/dL (ref 6.0–8.3)

## 2018-09-24 LAB — PSA: PSA: 1.77 ng/mL (ref 0.10–4.00)

## 2018-09-24 LAB — CBC WITH DIFFERENTIAL/PLATELET
Basophils Absolute: 0 10*3/uL (ref 0.0–0.1)
Basophils Relative: 0.4 % (ref 0.0–3.0)
Eosinophils Absolute: 0.1 10*3/uL (ref 0.0–0.7)
Eosinophils Relative: 1.3 % (ref 0.0–5.0)
HCT: 42.7 % (ref 39.0–52.0)
Hemoglobin: 14.7 g/dL (ref 13.0–17.0)
Lymphocytes Relative: 23.6 % (ref 12.0–46.0)
Lymphs Abs: 2.4 10*3/uL (ref 0.7–4.0)
MCHC: 34.4 g/dL (ref 30.0–36.0)
MCV: 95.5 fl (ref 78.0–100.0)
Monocytes Absolute: 0.5 10*3/uL (ref 0.1–1.0)
Monocytes Relative: 5.4 % (ref 3.0–12.0)
Neutro Abs: 6.9 10*3/uL (ref 1.4–7.7)
Neutrophils Relative %: 69.3 % (ref 43.0–77.0)
Platelets: 147 10*3/uL — ABNORMAL LOW (ref 150.0–400.0)
RBC: 4.47 Mil/uL (ref 4.22–5.81)
RDW: 13.1 % (ref 11.5–15.5)
WBC: 10 10*3/uL (ref 4.0–10.5)

## 2018-09-24 LAB — BASIC METABOLIC PANEL
BUN: 14 mg/dL (ref 6–23)
CO2: 30 mEq/L (ref 19–32)
Calcium: 9.2 mg/dL (ref 8.4–10.5)
Chloride: 103 mEq/L (ref 96–112)
Creatinine, Ser: 1.11 mg/dL (ref 0.40–1.50)
GFR: 64.84 mL/min (ref >60.00)
Glucose, Bld: 248 mg/dL — ABNORMAL HIGH (ref 70–99)
Potassium: 4 mEq/L (ref 3.5–5.1)
Sodium: 141 mEq/L (ref 135–145)

## 2018-09-24 LAB — HEMOGLOBIN A1C: Hgb A1c MFr Bld: 7.9 % — ABNORMAL HIGH (ref 4.6–6.5)

## 2018-09-24 LAB — LIPID PANEL
Cholesterol: 107 mg/dL (ref 0–200)
HDL: 30.5 mg/dL — ABNORMAL LOW (ref >39.00)
NonHDL: 76.12
Total CHOL/HDL Ratio: 3
Triglycerides: 222 mg/dL — ABNORMAL HIGH (ref 0.0–149.0)
VLDL: 44.4 mg/dL — ABNORMAL HIGH (ref 0.0–40.0)

## 2018-09-24 LAB — TSH: TSH: 2.06 u[IU]/mL (ref 0.35–4.50)

## 2018-09-24 LAB — LDL CHOLESTEROL, DIRECT: Direct LDL: 54 mg/dL

## 2018-09-25 ENCOUNTER — Encounter: Payer: Self-pay | Admitting: Internal Medicine

## 2018-09-25 ENCOUNTER — Other Ambulatory Visit: Payer: Self-pay | Admitting: Internal Medicine

## 2018-09-25 MED ORDER — INSULIN GLARGINE 100 UNIT/ML SOLOSTAR PEN: 35.0000 [IU] | PEN_INJECTOR | Freq: Every day | SUBCUTANEOUS | 11 refills | Status: DC

## 2018-09-26 ENCOUNTER — Telehealth: Payer: Self-pay

## 2018-09-26 DIAGNOSIS — Z794 Long term (current) use of insulin: Secondary | ICD-10-CM | POA: Diagnosis not present

## 2018-09-26 DIAGNOSIS — E1151 Type 2 diabetes mellitus with diabetic peripheral angiopathy without gangrene: Secondary | ICD-10-CM | POA: Diagnosis not present

## 2018-09-26 DIAGNOSIS — I509 Heart failure, unspecified: Secondary | ICD-10-CM | POA: Diagnosis not present

## 2018-09-26 DIAGNOSIS — F339 Major depressive disorder, recurrent, unspecified: Secondary | ICD-10-CM | POA: Diagnosis not present

## 2018-09-26 DIAGNOSIS — Z87891 Personal history of nicotine dependence: Secondary | ICD-10-CM | POA: Diagnosis not present

## 2018-09-26 DIAGNOSIS — I110 Hypertensive heart disease with heart failure: Secondary | ICD-10-CM | POA: Diagnosis not present

## 2018-09-26 DIAGNOSIS — E261 Secondary hyperaldosteronism: Secondary | ICD-10-CM | POA: Diagnosis not present

## 2018-09-26 DIAGNOSIS — E1140 Type 2 diabetes mellitus with diabetic neuropathy, unspecified: Secondary | ICD-10-CM | POA: Diagnosis not present

## 2018-09-26 DIAGNOSIS — I25118 Atherosclerotic heart disease of native coronary artery with other forms of angina pectoris: Secondary | ICD-10-CM | POA: Diagnosis not present

## 2018-09-26 NOTE — Telephone Encounter (Signed)
-----   Message from Biagio Borg, MD sent at 09/25/2018  9:07 PM EDT ----- Letter sent, cont same tx except  The test results show that your current treatment is OK, except the A1c is still mildlly elevated.  Please increase the Lantus insulin from 28 units to 35 units per day.  I will send a new prescription and you should hear from the office as well.Aaron Maldonado to please inform pt, I will do rx

## 2018-09-26 NOTE — Telephone Encounter (Signed)
Pt has been informed of results and expressed understanding.  °

## 2018-10-06 DIAGNOSIS — M549 Dorsalgia, unspecified: Secondary | ICD-10-CM | POA: Diagnosis not present

## 2018-10-06 DIAGNOSIS — S20229A Contusion of unspecified back wall of thorax, initial encounter: Secondary | ICD-10-CM | POA: Diagnosis not present

## 2018-10-06 DIAGNOSIS — S299XXA Unspecified injury of thorax, initial encounter: Secondary | ICD-10-CM | POA: Diagnosis not present

## 2018-10-06 DIAGNOSIS — Z87891 Personal history of nicotine dependence: Secondary | ICD-10-CM | POA: Diagnosis not present

## 2018-10-06 DIAGNOSIS — M545 Low back pain: Secondary | ICD-10-CM | POA: Diagnosis not present

## 2018-10-06 DIAGNOSIS — W228XXA Striking against or struck by other objects, initial encounter: Secondary | ICD-10-CM | POA: Diagnosis not present

## 2018-10-06 DIAGNOSIS — R079 Chest pain, unspecified: Secondary | ICD-10-CM | POA: Diagnosis not present

## 2018-10-06 DIAGNOSIS — W19XXXA Unspecified fall, initial encounter: Secondary | ICD-10-CM | POA: Diagnosis not present

## 2018-10-08 ENCOUNTER — Telehealth: Payer: Self-pay | Admitting: Internal Medicine

## 2018-10-08 ENCOUNTER — Telehealth: Payer: Self-pay | Admitting: Cardiovascular Disease

## 2018-10-08 MED ORDER — INSULIN GLARGINE 100 UNIT/ML SOLOSTAR PEN: 45.0000 [IU] | PEN_INJECTOR | Freq: Every day | SUBCUTANEOUS | 11 refills | Status: DC

## 2018-10-08 NOTE — Telephone Encounter (Signed)
Pt called stating ever since 35 units of insulin his Glucose has been high, he ranges in the morning before food between 150-160 and after food over 200. I confirmed he is Taking his Glipizide. He is having no symptoms.   He did fall the other day and he saw a different doctor he hurt his back and he was prescribed Methocarbomal, the increase in sugars started before the new Rx.   Please advise

## 2018-10-08 NOTE — Addendum Note (Signed)
Addended by: Biagio Borg on: 10/08/2018 01:08 PM   Modules accepted: Orders

## 2018-10-08 NOTE — Telephone Encounter (Signed)
Lab Results  Component Value Date   HGBA1C 7.9 (H) 09/24/2018   Ok to increase the lantus to 45 units per day, check sugars as he does, and call in 1 wk if not improved

## 2018-10-08 NOTE — Telephone Encounter (Signed)
Pt has been informed and expressed understanding. Will call back with new readings in a week.

## 2018-10-08 NOTE — Telephone Encounter (Signed)
See below , thanks

## 2018-10-09 ENCOUNTER — Telehealth: Payer: Self-pay

## 2018-10-09 ENCOUNTER — Other Ambulatory Visit: Payer: Self-pay

## 2018-10-09 ENCOUNTER — Telehealth (INDEPENDENT_AMBULATORY_CARE_PROVIDER_SITE_OTHER): Payer: Medicare HMO | Admitting: Cardiovascular Disease

## 2018-10-09 DIAGNOSIS — I10 Essential (primary) hypertension: Secondary | ICD-10-CM | POA: Diagnosis not present

## 2018-10-09 DIAGNOSIS — E782 Mixed hyperlipidemia: Secondary | ICD-10-CM

## 2018-10-09 DIAGNOSIS — I2510 Atherosclerotic heart disease of native coronary artery without angina pectoris: Secondary | ICD-10-CM

## 2018-10-09 DIAGNOSIS — G4733 Obstructive sleep apnea (adult) (pediatric): Secondary | ICD-10-CM | POA: Diagnosis not present

## 2018-10-09 DIAGNOSIS — I472 Ventricular tachycardia: Secondary | ICD-10-CM | POA: Diagnosis not present

## 2018-10-09 MED ORDER — ROSUVASTATIN CALCIUM 10 MG PO TABS: 10.0000 mg | ORAL_TABLET | Freq: Every day | ORAL | 3 refills | Status: DC

## 2018-10-09 NOTE — Telephone Encounter (Signed)
Patient and/or DPR-approved person aware of AVS instructions and verbalized understanding. Letter including After Visit Summary and any other necessary documents to be mailed to the patient's address on file.  

## 2018-10-09 NOTE — Progress Notes (Signed)
Virtual Visit via Video Note   This visit type was conducted due to national recommendations for restrictions regarding the COVID-19 Pandemic (e.g. social distancing) in an effort to limit this patient's exposure and mitigate transmission in our community.  Due to his co-morbid illnesses, this patient is at least at moderate risk for complications without adequate follow up.  This format is felt to be most appropriate for this patient at this time.  All issues noted in this document were discussed and addressed.  A limited physical exam was performed with this format.  Please refer to the patient's chart for his consent to telehealth for [facility].   Date:  10/09/2018   ID:  Aaron Maldonado, DOB 09/04/1945, MRN [id number]  Patient Location: Home Provider Location: Home  PCP:  Biagio Borg, MD  Cardiologist: Dr. Quay Burow Electrophysiologist:  None   Evaluation Performed:  Follow-Up Visit  Chief Complaint: Follow-up hypertension and hyperlipidemia  History of Present Illness:    Aaron Maldonado is a 73 y.o. male moderately overweight divorced Caucasian male father of 4 living children (one deceased) andgreat grandfather to6 grandchildren who I last saw in the office  04/06/2017 .He is a retired Building surveyor. His primary care is provided by the [facility] in [place]. He was referred by the ER at [facility] for evaluation of chest pain. His cardiac risk factor profile is remarkable for 60 pack years of tobacco abuse having quit back in 1990. He has treated hypertension, diabetes or hyperlipidemia. There is no family history of heart disease. He's never had a heart attack or stroke. 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 He was complaining of chest pain and shortness of breath when I saw him 2 years ago which led to a Myoview stress test 12/11/13 that is high risk with apical scar and peri-infarct ischemia.  His EF likewise was reduced by 2-D echo in the 40-45% range with an elevated apical and anterolateral as well as septal hypokinesia. This led to cardiac catheterization performed 01/06/14 by myself revealing noncritical CAD with normal LV function. In retrospect, he attributes the chest pain to falling in his bathtub several weeks prior to seeing me at that time.   He did have an episode of presyncope and was evaluated in [place] and transferred to [facility] where he had white cardiac catheterization by Dr. Philbert Riser revealing minimal CAD and an echo that showed normal LV function. He did have an event monitor that showed runs of nonsustained ventricular tachycardia. His losartan was discontinued and he was begun on amlodipine.  Since I saw him in the office almost 2 years ago he is done well.  He is sheltering in place and socially distancing.  He denies chest pain or shortness of breath.  His vital signs are stable and his most recent lipid profile performed 09/24/2018 revealed a total cholesterol 107, LDL 54 and HDL of 30.  The patient does not have symptoms concerning for COVID-19 infection (fever, chills, cough, or new shortness of breath).    Past Medical History:  Diagnosis Date   Abnormal nuclear stress test 12/29/2013   Allergy    Arthritis    Chest pain    CHF (congestive heart failure) ([facility])    Chicken pox    Colon polyps    Coronary artery disease    Depression    Diabetes mellitus without complication ([place])  Diverticulitis    Dyspnea on exertion    Heart murmur    History of heart surgery    Hyperlipidemia    Hypertension    Stroke [facility])    Past Surgical History:  Procedure Laterality Date   KNEE SURGERY     LEFT HEART CATHETERIZATION WITH CORONARY ANGIOGRAM N/A 01/06/2014   Procedure: LEFT HEART CATHETERIZATION WITH CORONARY ANGIOGRAM;  Surgeon: Lorretta Harp, MD;  Location: [facility] CATH LAB;  Service: Cardiovascular;   Laterality: N/A;   SHOULDER SURGERY     TONSILLECTOMY       No outpatient medications have been marked as taking for the 10/09/18 encounter (Appointment) with Lorretta Harp, MD.     Allergies:   Keflex [cephalexin]; Lisinopril; Penicillins; Sulfa antibiotics; and Sulfasalazine   Social History   Tobacco Use   Smoking status: Former Smoker    Packs/day: 1.50    Years: 43.00    Pack years: 64.50    Types: Cigarettes    Last attempt to quit: 05/22/1988    Years since quitting: 30.4   Smokeless tobacco: Never Used  Substance Use Topics   Alcohol use: No   Drug use: No     Family Hx: The patient's family history includes Alzheimer's disease in his father; Breast cancer in his sister; Heart disease in his mother; Heart failure in his mother; Hyperlipidemia in his maternal grandfather and maternal grandmother; Hypertension in his maternal grandfather, maternal grandmother, paternal grandfather, and paternal grandmother; 44 / [place] in his maternal grandfather and maternal grandmother; Mitral valve prolapse in his mother; SIDS in his daughter.  ROS:   Please see the history of present illness.     All other systems reviewed and are negative.   Prior CV studies:   The following studies were reviewed today:  None  Labs/Other Tests and Data Reviewed:    EKG:  No ECG reviewed.  Recent Labs: 09/24/2018: ALT 15; BUN 14; Creatinine, Ser 1.11; Hemoglobin 14.7; Platelets 147.0; Potassium 4.0; Sodium 141; TSH 2.06   Recent Lipid Panel Lab Results  Component Value Date/Time   CHOL 107 09/24/2018 12:31 PM   TRIG 222.0 (H) 09/24/2018 12:31 PM   HDL 30.50 (L) 09/24/2018 12:31 PM   CHOLHDL 3 09/24/2018 12:31 PM   LDLCALC 57 12/01/2013 11:52 AM   LDLDIRECT 54.0 09/24/2018 12:31 PM    Wt Readings from Last 3 Encounters:  03/25/18 251 lb (113.9 kg)  12/20/17 247 lb (112 kg)  12/06/17 245 lb (111.1 kg)     Objective:    Vital Signs:  There were no vitals  taken for this visit.   VITAL SIGNS:  reviewed GEN:  no acute distress RESPIRATORY:  normal respiratory effort, symmetric expansion NEURO:  alert and oriented x 3, no obvious focal deficit PSYCH:  normal affect  ASSESSMENT & PLAN:    1. Essential hypertension- history of essential hypertension with blood pressure measured by the patient at home of 142/66 with a pulse of 76.  He is on amlodipine and carvedilol 2. Hyperlipidemia- history of hyperlipidemia on Crestor with lipid profile performed 09/24/2018 revealing total cholesterol 107, LDL 54 and HDL 30 3. Normal coronary arteries- history of normal cath 01/06/2014 and a subsequent normal cath by Dr. Philbert Riser at [facility].  COVID-19 Education: The signs and symptoms of COVID-19 were discussed with the patient and how to seek care for testing (follow up with PCP or arrange E-visit).  The importance of social distancing was discussed today.  Time:   Today, I have spent 8 minutes with the patient with telehealth technology discussing the above problems.     Medication Adjustments/Labs and Tests Ordered: Current medicines are reviewed at length with the patient today.  Concerns regarding medicines are outlined above.   Tests Ordered: No orders of the defined types were placed in this encounter.   Medication Changes: No orders of the defined types were placed in this encounter.   Disposition:  Follow up in 1 year(s)  Signed, Quay Burow, MD  10/09/2018 8:53 AM    [facility]

## 2018-10-09 NOTE — Patient Instructions (Signed)

## 2018-10-15 ENCOUNTER — Encounter: Payer: Self-pay | Admitting: Gastroenterology

## 2018-10-15 NOTE — Telephone Encounter (Signed)
Patient called in and stated these were his readings 19th :191 20th:130 21st:145 22nd:137 23rd: 129 24th: 125 25th: 103 26th: 96.

## 2018-10-16 NOTE — Telephone Encounter (Signed)
These sugars are quite good overall, and I would not change the treatment at this time.  OK to cont current meds, diet and level of activity

## 2018-10-17 NOTE — Telephone Encounter (Signed)
Pt has been informed and expressed understanding.  

## 2018-10-29 ENCOUNTER — Other Ambulatory Visit: Payer: Self-pay

## 2018-10-29 ENCOUNTER — Ambulatory Visit: Payer: Medicare HMO

## 2018-10-29 VITALS — Ht 71.0 in | Wt 151.0 lb

## 2018-10-29 DIAGNOSIS — Z1211 Encounter for screening for malignant neoplasm of colon: Secondary | ICD-10-CM

## 2018-10-29 MED ORDER — PEG 3350-KCL-NA BICARB-NACL 420 G PO SOLR: 4000.0000 mL | Freq: Once | ORAL | 0 refills | Status: AC

## 2018-10-29 NOTE — Progress Notes (Signed)
No egg or soy allergy known to patient  No issues with past sedation with any surgeries  or procedures, no intubation problems  No diet pills per patient No home 02 use per patient  No blood thinners per patient  Pt denies issues with constipation  No A fib or A flutter  EMMI video sent to pt's e mail  

## 2018-11-01 ENCOUNTER — Encounter: Payer: Self-pay | Admitting: Gastroenterology

## 2018-11-11 ENCOUNTER — Telehealth: Payer: Self-pay | Admitting: Gastroenterology

## 2018-11-11 NOTE — Telephone Encounter (Signed)
lmom for pt to cb and answer Covid 19 questions °

## 2018-11-12 ENCOUNTER — Other Ambulatory Visit: Payer: Self-pay

## 2018-11-12 ENCOUNTER — Ambulatory Visit (AMBULATORY_SURGERY_CENTER): Payer: Medicare HMO | Admitting: Gastroenterology

## 2018-11-12 ENCOUNTER — Encounter: Payer: Self-pay | Admitting: Gastroenterology

## 2018-11-12 VITALS — BP 120/94 | HR 66 | Temp 98.7°F | Resp 14 | Ht 71.0 in | Wt 250.0 lb

## 2018-11-12 DIAGNOSIS — Z1211 Encounter for screening for malignant neoplasm of colon: Secondary | ICD-10-CM

## 2018-11-12 DIAGNOSIS — K5730 Diverticulosis of large intestine without perforation or abscess without bleeding: Secondary | ICD-10-CM

## 2018-11-12 DIAGNOSIS — D122 Benign neoplasm of ascending colon: Secondary | ICD-10-CM

## 2018-11-12 MED ORDER — SODIUM CHLORIDE 0.9 % IV SOLN: 500.0000 mL | Freq: Once | INTRAVENOUS | Status: DC

## 2018-11-12 NOTE — Progress Notes (Signed)
Called to room to assist during endoscopic procedure.  Patient ID and intended procedure confirmed with present staff. Received instructions for my participation in the procedure from the performing physician.  

## 2018-11-12 NOTE — Progress Notes (Signed)
Covid screening and temp done by Riki Sheer. Vital signs done by JudyBranson

## 2018-11-12 NOTE — Patient Instructions (Signed)
Discharge instructions given. Handouts on polyps and diverticulosis. Resume previous medications. YOU HAD AN ENDOSCOPIC PROCEDURE TODAY AT [facility]:   Refer to the procedure report that was given to you for any specific questions about what was found during the examination.  If the procedure report does not answer your questions, please call your gastroenterologist to clarify.  If you requested that your care partner not be given the details of your procedure findings, then the procedure report has been included in a sealed envelope for you to review at your convenience later.  YOU SHOULD EXPECT: Some feelings of bloating in the abdomen. Passage of more gas than usual.  Walking can help get rid of the air that was put into your GI tract during the procedure and reduce the bloating. If you had a lower endoscopy (such as a colonoscopy or flexible sigmoidoscopy) you may notice spotting of blood in your stool or on the toilet paper. If you underwent a bowel prep for your procedure, you may not have a normal bowel movement for a few days.  Please Note:  You might notice some irritation and congestion in your nose or some drainage.  This is from the oxygen used during your procedure.  There is no need for concern and it should clear up in a day or so.  SYMPTOMS TO REPORT IMMEDIATELY:   Following lower endoscopy (colonoscopy or flexible sigmoidoscopy):  Excessive amounts of blood in the stool  Significant tenderness or worsening of abdominal pains  Swelling of the abdomen that is new, acute  Fever of 100F or higher  For urgent or emergent issues, a gastroenterologist can be reached at any hour by calling [phone].   DIET:  We do recommend a small meal at first, but then you may proceed to your regular diet.  Drink plenty of fluids but you should avoid alcoholic beverages for 24 hours.  ACTIVITY:  You should plan to take it easy for the rest of today and you should NOT DRIVE  or use heavy machinery until tomorrow (because of the sedation medicines used during the test).    FOLLOW UP: Our staff will call the number listed on your records 48-72 hours following your procedure to check on you and address any questions or concerns that you may have regarding the information given to you following your procedure. If we do not reach you, we will leave a message.  We will attempt to reach you two times.  During this call, we will ask if you have developed any symptoms of COVID 19. If you develop any symptoms (ie: fever, flu-like symptoms, shortness of breath, cough etc.) before then, please call [phone].  If you test positive for Covid 19 in the 2 weeks post procedure, please call and report this information to [place].    If any biopsies were taken you will be contacted by phone or by letter within the next 1-3 weeks.  Please call us at [phone] if you have not heard about the biopsies in 3 weeks.    SIGNATURES/CONFIDENTIALITY: You and/or your care partner have signed paperwork which will be entered into your electronic medical record.  These signatures attest to the fact that that the information above on your After Visit Summary has been reviewed and is understood.  Full responsibility of the confidentiality of this discharge information lies with you and/or your care-partner.

## 2018-11-12 NOTE — Op Note (Signed)
[facility] Patient Name: Aaron Maldonado Procedure Date: 11/12/2018 7:53 AM MRN: [id number] Endoscopist: Milus Banister , MD Age: 73 Referring MD:  Date of Birth: 04/25/1946 Gender: Male Account #: [id number] Procedure:                Colonoscopy Indications:              Screening for colorectal malignant neoplasm Medicines:                Monitored Anesthesia Care Procedure:                Pre-Anesthesia Assessment:                           - Prior to the procedure, a History and Physical                            was performed, and patient medications and                            allergies were reviewed. The patient's tolerance of                            previous anesthesia was also reviewed. The risks                            and benefits of the procedure and the sedation                            options and risks were discussed with the patient.                            All questions were answered, and informed consent                            was obtained. Prior Anticoagulants: The patient has                            taken no previous anticoagulant or antiplatelet                            agents. ASA Grade Assessment: II - A patient with                            mild systemic disease. After reviewing the risks                            and benefits, the patient was deemed in                            satisfactory condition to undergo the procedure.                           After obtaining informed consent, the colonoscope  was passed under direct vision. Throughout the                            procedure, the patient's blood pressure, pulse, and                            oxygen saturations were monitored continuously. The                            Model CF-HQ190L [id number]) scope was introduced                            through the anus and advanced to the the cecum,                            identified by  appendiceal orifice and ileocecal                            valve. The colonoscopy was performed without                            difficulty. The patient tolerated the procedure                            well. The quality of the bowel preparation was                            adequate. The ileocecal valve, appendiceal orifice,                            and rectum were photographed. Scope In: 8:06:12 AM Scope Out: 8:16:52 AM Scope Withdrawal Time: 0 hours 7 minutes 16 seconds  Total Procedure Duration: 0 hours 10 minutes 40 seconds  Findings:                 Three sessile polyps were found in the ascending                            colon. The polyps were 2 to 4 mm in size. These                            polyps were removed with a cold snare. Resection                            and retrieval were complete.                           Multiple small and large-mouthed diverticula were                            found in the left colon.                           The exam was otherwise without abnormality on  direct and retroflexion views. Complications:            No immediate complications. Estimated blood loss:                            None. Estimated Blood Loss:     Estimated blood loss: none. Impression:               - Three 2 to 4 mm polyps in the ascending colon,                            removed with a cold snare. Resected and retrieved.                           - Diverticulosis in the left colon.                           - The examination was otherwise normal on direct                            and retroflexion views. Recommendation:           - Patient has a contact number available for                            emergencies. The signs and symptoms of potential                            delayed complications were discussed with the                            patient. Return to normal activities tomorrow.                            Written  discharge instructions were provided to the                            patient.                           - Resume previous diet.                           - Continue present medications.                           You will receive a letter within 2-3 weeks with the                            pathology results and my final recommendations.                           If the polyp(s) is proven to be 'pre-cancerous' on                            pathology, you will need repeat colonoscopy in 3-7  years. If the polyp(s) is NOT 'precancerous' on                            pathology then you should repeat colon cancer                            screening in 10 years with colonoscopy without need                            for colon cancer screening by any method prior to                            then (including stool testing). Milus Banister, MD 11/12/2018 8:19:19 AM This report has been signed electronically.

## 2018-11-12 NOTE — Progress Notes (Signed)
Pt's states no medical or surgical changes since previsit or office visit. 

## 2018-11-14 ENCOUNTER — Telehealth: Payer: Self-pay

## 2018-11-14 NOTE — Telephone Encounter (Signed)
  Follow up Call-  Call back number 11/12/2018  Post procedure Call Back phone  # [phone]  Permission to leave phone message Yes  Some recent data might be hidden     Patient questions:  Do you have a fever, pain , or abdominal swelling? No. Pain Score  0 *  Have you tolerated food without any problems? Yes.    Have you been able to return to your normal activities? Yes.    Do you have any questions about your discharge instructions: Diet   No. Medications  No. Follow up visit  No.  Do you have questions or concerns about your Care? No.  Actions: * If pain score is 4 or above: No action needed, pain <4.

## 2018-11-18 NOTE — Telephone Encounter (Signed)
Opened in error

## 2018-11-19 ENCOUNTER — Encounter: Payer: Self-pay | Admitting: Gastroenterology

## 2018-11-27 ENCOUNTER — Telehealth: Payer: Self-pay | Admitting: Internal Medicine

## 2018-11-27 MED ORDER — LANTUS SOLOSTAR 100 UNIT/ML ~~LOC~~ SOPN: 45.0000 [IU] | PEN_INJECTOR | Freq: Every day | SUBCUTANEOUS | 5 refills | Status: DC

## 2018-11-27 NOTE — Telephone Encounter (Signed)
Medication Refill - Medication: Insulin Glargine (LANTUS SOLOSTAR) 100 UNIT/ML Solostar Pen  Pt states Dr Christel Mormon needs to get this rx.   Preferred Pharmacy:  [place], [place] - [address]  [phone] [place] [address]  Phone: [phone] Fax: [phone]     Agent: Please be advised that RX refills may take up to 3 business days. We ask that you follow-up with your pharmacy.

## 2019-01-08 DIAGNOSIS — M79675 Pain in left toe(s): Secondary | ICD-10-CM | POA: Diagnosis not present

## 2019-01-08 DIAGNOSIS — E119 Type 2 diabetes mellitus without complications: Secondary | ICD-10-CM | POA: Diagnosis not present

## 2019-03-27 ENCOUNTER — Encounter: Payer: Self-pay | Admitting: Internal Medicine

## 2019-03-27 ENCOUNTER — Ambulatory Visit (INDEPENDENT_AMBULATORY_CARE_PROVIDER_SITE_OTHER): Payer: Medicare HMO | Admitting: Internal Medicine

## 2019-03-27 ENCOUNTER — Other Ambulatory Visit: Payer: Self-pay

## 2019-03-27 VITALS — BP 124/76 | HR 62 | Temp 98.3°F | Ht 71.0 in | Wt 256.0 lb

## 2019-03-27 DIAGNOSIS — E1142 Type 2 diabetes mellitus with diabetic polyneuropathy: Secondary | ICD-10-CM | POA: Diagnosis not present

## 2019-03-27 DIAGNOSIS — E611 Iron deficiency: Secondary | ICD-10-CM | POA: Diagnosis not present

## 2019-03-27 DIAGNOSIS — E559 Vitamin D deficiency, unspecified: Secondary | ICD-10-CM

## 2019-03-27 DIAGNOSIS — E782 Mixed hyperlipidemia: Secondary | ICD-10-CM

## 2019-03-27 DIAGNOSIS — Z0000 Encounter for general adult medical examination without abnormal findings: Secondary | ICD-10-CM

## 2019-03-27 DIAGNOSIS — I10 Essential (primary) hypertension: Secondary | ICD-10-CM

## 2019-03-27 DIAGNOSIS — E538 Deficiency of other specified B group vitamins: Secondary | ICD-10-CM

## 2019-03-27 LAB — POCT GLYCOSYLATED HEMOGLOBIN (HGB A1C): Hemoglobin A1C: 7.4 % — AB (ref 4.0–5.6)

## 2019-03-27 NOTE — Assessment & Plan Note (Signed)
stable overall by history and exam, recent data reviewed with pt, and pt to continue medical treatment as before,  to f/u any worsening symptoms or concerns  

## 2019-03-27 NOTE — Progress Notes (Addendum)
Subjective:    Patient ID: Aaron Maldonado, male    DOB: [birth year], 73 y.o.   MRN: [id number]  HPI  Here to f/u; overall doing ok,  Pt denies chest pain, increasing sob or doe, wheezing, orthopnea, PND, increased LE swelling, palpitations, dizziness or syncope.  Pt denies new neurological symptoms such as new headache, or facial or extremity weakness or numbness.  Pt denies polydipsia, polyuria, or low sugar episode.  Pt states overall good compliance with meds, mostly trying to follow appropriate diet, with wt overall stable,  but little exercise however.  Does have several wks ongoing nasal allergy symptoms with clearish congestion, itch and sneezing, without fever, pain, ST, cough, swelling or wheezing. But has also eye congestion and drainage, and only takes the zyrtec infrequently.  CBGs in low 100s at home in AM.  Gained several lbs with the pandemic. Wt Readings from Last 3 Encounters:  03/27/19 256 lb (116.1 kg)  11/12/18 250 lb (113.4 kg)  10/29/18 151 lb (68.5 kg)    Past Medical History:  Diagnosis Date  . Abnormal nuclear stress test 12/29/2013  . Allergy   . Arthritis   . Chest pain   . CHF (congestive heart failure) ([place])   . Chicken pox   . Colon polyps   . Coronary artery disease   . Depression   . Diabetes mellitus without complication ([place])   . Diverticulitis   . Dyspnea on exertion   . GERD (gastroesophageal reflux disease)   . Heart murmur   . History of heart surgery   . Hyperlipidemia   . Hypertension   . Stroke [facility])    Past Surgical History:  Procedure Laterality Date  . COLONOSCOPY    . KNEE SURGERY    . LEFT HEART CATHETERIZATION WITH CORONARY ANGIOGRAM N/A 01/06/2014   Procedure: LEFT HEART CATHETERIZATION WITH CORONARY ANGIOGRAM;  Surgeon: Lorretta Harp, MD;  Location: [facility] CATH LAB;  Service: Cardiovascular;  Laterality: N/A;  . SHOULDER SURGERY    . TONSILLECTOMY      reports that he quit smoking about 30 years ago. His smoking use included  cigarettes. He has a 64.50 pack-year smoking history. He has never used smokeless tobacco. He reports that he does not drink alcohol or use drugs. family history includes Alzheimer's disease in his father; Breast cancer in his sister; Heart disease in his mother; Heart failure in his mother; Hyperlipidemia in his maternal grandfather and maternal grandmother; Hypertension in his maternal grandfather, maternal grandmother, paternal grandfather, and paternal grandmother; 29 / [place] in his maternal grandfather and maternal grandmother; Mitral valve prolapse in his mother; SIDS in his daughter. Allergies  Allergen Reactions  . Keflex [Cephalexin]     unknown  . Lisinopril Cough  . Penicillins Rash    Has patient had a PCN reaction causing immediate rash, facial/tongue/throat swelling, SOB or lightheadedness with hypotension: No Has patient had a PCN reaction causing severe rash involving mucus membranes or skin necrosis: No Has patient had a PCN reaction that required hospitalization: No Has patient had a PCN reaction occurring within the last 10 years: No If all of the above answers are "NO", then may proceed with Cephalosporin use.   . Sulfa Antibiotics Rash    rash  . Sulfasalazine Rash    rash   Current Outpatient Medications on File Prior to Visit  Medication Sig Dispense Refill  . allopurinol (ZYLOPRIM) 300 MG tablet Take 300 mg by mouth daily.    Marland Kitchen amLODipine (NORVASC)  5 MG tablet Take 5 mg daily by mouth.    Marland Kitchen aspirin 81 MG tablet Take 81 mg by mouth daily.    . calcium carbonate (OS-CAL) 600 MG TABS tablet Take 600 mg by mouth daily.    . carvedilol (COREG) 3.125 MG tablet Take 1 tablet (3.125 mg total) by mouth daily. 90 tablet 4  . cetirizine (ZYRTEC) 10 MG tablet Take 10 mg by mouth daily.    . Continuous Blood Gluc Receiver (FREESTYLE LIBRE 14 DAY READER) DEVI 1 Device by Does not apply route 4 (four) times daily. E11.9 1 Device 0  . Continuous Blood Gluc Sensor  (FREESTYLE LIBRE SENSOR SYSTEM) MISC 1 Device by Does not apply route every 14 (fourteen) days. E11.9 6 each 3  . furosemide (LASIX) 40 MG tablet Take 40 mg by mouth.    . gabapentin (NEURONTIN) 300 MG capsule Take 600 mg by mouth 2 (two) times daily.     Marland Kitchen glipiZIDE (GLUCOTROL XL) 10 MG 24 hr tablet TAKE 1 TABLET BY MOUTH ONCE DAILY WITH BREAKFAST 90 tablet 3  . HYDROcodone-acetaminophen (NORCO/VICODIN) 5-325 MG tablet Take 1 tablet by mouth 2 (two) times daily as needed for moderate pain. 14 tablet 0  . Insulin Glargine (LANTUS SOLOSTAR) 100 UNIT/ML Solostar Pen Inject 45 Units into the skin daily at 10 pm. 15 mL 5  . Insulin Pen Needle (BD PEN NEEDLE NANO U/F) 32G X 4 MM MISC 1 with pen use daily 100 each 3  . metFORMIN (GLUCOPHAGE) 1000 MG tablet Take 1,000 mg by mouth 2 (two) times daily with a meal.    . nitroGLYCERIN (NITROSTAT) 0.4 MG SL tablet Place 1 tablet (0.4 mg total) under the tongue every 5 (five) minutes as needed for chest pain. 90 tablet 3  . omeprazole (PRILOSEC) 20 MG capsule Take 20 mg by mouth 2 (two) times daily before a meal.    . potassium chloride (KLOR-CON) 8 MEQ tablet Take 8 mEq by mouth daily.    Marland Kitchen rOPINIRole (REQUIP) 3 MG tablet Take 3 tablets (9 mg total) by mouth at bedtime. 90 tablet 11  . rosuvastatin (CRESTOR) 10 MG tablet Take 1 tablet (10 mg total) by mouth daily. 90 tablet 3  . traMADol (ULTRAM) 50 MG tablet Take 1 tablet (50 mg total) by mouth every 6 (six) hours as needed. 30 tablet 0   No current facility-administered medications on file prior to visit.    Review of Systems  Constitutional: Negative for other unusual diaphoresis or sweats HENT: Negative for ear discharge or swelling Eyes: Negative for other worsening visual disturbances Respiratory: Negative for stridor or other swelling  Gastrointestinal: Negative for worsening distension or other blood Genitourinary: Negative for retention or other urinary change Musculoskeletal: Negative for other  MSK pain or swelling Skin: Negative for color change or other new lesions Neurological: Negative for worsening tremors and other numbness  Psychiatric/Behavioral: Negative for worsening agitation or other fatigue All otherwise neg per pt     Objective:   Physical Exam BP 124/76   Pulse 62   Temp 98.3 F (36.8 C) (Oral)   Ht 5\' 11"  (1.803 m)   Wt 256 lb (116.1 kg)   SpO2 95%   BMI 35.70 kg/m  VS noted,  Constitutional: Pt appears in NAD HENT: Head: NCAT.  Right Ear: External ear normal.  Left Ear: External ear normal.  Eyes: . Pupils are equal, round, and reactive to light. Conjunctivae and EOM are normal Nose: without d/c or deformity  Neck: Neck supple. Gross normal ROM Cardiovascular: Normal rate and regular rhythm.   Pulmonary/Chest: Effort normal and breath sounds without rales or wheezing.  Abd:  Soft, NT, ND, + BS, no organomegaly Neurological: Pt is alert. At baseline orientation, motor grossly intact Skin: Skin is warm. No rashes, other new lesions, no LE edema Psychiatric: Pt behavior is normal without agitation  All otherwise neg per pt   Lab Results  Component Value Date   WBC 10.0 09/24/2018   HGB 14.7 09/24/2018   HCT 42.7 09/24/2018   PLT 147.0 (L) 09/24/2018   GLUCOSE 248 (H) 09/24/2018   CHOL 107 09/24/2018   TRIG 222.0 (H) 09/24/2018   HDL 30.50 (L) 09/24/2018   LDLDIRECT 54.0 09/24/2018   LDLCALC 57 12/01/2013   ALT 15 09/24/2018   AST 16 09/24/2018   NA 141 09/24/2018   K 4.0 09/24/2018   CL 103 09/24/2018   CREATININE 1.11 09/24/2018   BUN 14 09/24/2018   CO2 30 09/24/2018   TSH 2.06 09/24/2018   PSA 1.77 09/24/2018   INR 0.93 12/29/2013   HGBA1C 7.9 (H) 09/24/2018   MICROALBUR 1.1 03/23/2017  POCT HgB A1C Order: [id number] Status:  Final result Visible to patient:  No (not released) Dx:  Type 2 diabetes mellitus with diabeti...  Ref Range & Units 10:54 (03/27/19) 91mo ago (09/24/18) 29yr ago (09/21/17) 77yr ago (03/23/17) 64yr ago  (12/08/16)  Hemoglobin A1C 4.0 - 5.6 % 7.4Abnormal   7.9High  R, CM  9.9High  R, CM  8.2High  R, CM  7.4High            Assessment & Plan:

## 2019-03-27 NOTE — Patient Instructions (Signed)
Your A1c was OK today  Please continue all other medications as before, and refills have been done if requested.  Please have the pharmacy call with any other refills you may need.  Please continue your efforts at being more active, low cholesterol diet, and weight control..  Please keep your appointments with your specialists as you may have planned  Please return in 6 months, or sooner if needed, with Lab testing done 3-5 days before  

## 2019-03-31 ENCOUNTER — Telehealth: Payer: Self-pay

## 2019-03-31 NOTE — Telephone Encounter (Signed)
According to cdc guidelines, she would not necessarily need to be tested without symptoms, except if exposed to a person who has proven  covid +  Please let us know if any symptoms or niece is covid +

## 2019-03-31 NOTE — Telephone Encounter (Signed)
Pt has been informed and expressed understanding.  

## 2019-03-31 NOTE — Telephone Encounter (Signed)
Copied from [facility] [phone]. Topic: General - Inquiry >> Mar 31, 2019  8:57 AM Reyne Dumas L wrote: Reason for CRM:   Pt's sister calling.  States they live with their niece who has had symptoms of COVIID and went to be tested yesterday.  Pt wants to know what precautions he needs to take and if he should be tested as well.  Niece is the caregiver. Mardene Celeste can be reached at [phone]

## 2019-04-10 DIAGNOSIS — Z7982 Long term (current) use of aspirin: Secondary | ICD-10-CM | POA: Diagnosis not present

## 2019-04-10 DIAGNOSIS — I25118 Atherosclerotic heart disease of native coronary artery with other forms of angina pectoris: Secondary | ICD-10-CM | POA: Diagnosis not present

## 2019-04-10 DIAGNOSIS — Z6836 Body mass index (BMI) 36.0-36.9, adult: Secondary | ICD-10-CM | POA: Diagnosis not present

## 2019-04-10 DIAGNOSIS — Z794 Long term (current) use of insulin: Secondary | ICD-10-CM | POA: Diagnosis not present

## 2019-04-10 DIAGNOSIS — I509 Heart failure, unspecified: Secondary | ICD-10-CM | POA: Diagnosis not present

## 2019-04-10 DIAGNOSIS — E1151 Type 2 diabetes mellitus with diabetic peripheral angiopathy without gangrene: Secondary | ICD-10-CM | POA: Diagnosis not present

## 2019-04-10 DIAGNOSIS — E261 Secondary hyperaldosteronism: Secondary | ICD-10-CM | POA: Diagnosis not present

## 2019-04-10 DIAGNOSIS — F3342 Major depressive disorder, recurrent, in full remission: Secondary | ICD-10-CM | POA: Diagnosis not present

## 2019-04-10 DIAGNOSIS — D696 Thrombocytopenia, unspecified: Secondary | ICD-10-CM | POA: Diagnosis not present

## 2019-06-04 ENCOUNTER — Telehealth: Payer: Self-pay | Admitting: Internal Medicine

## 2019-06-04 NOTE — Telephone Encounter (Signed)
Medical screening examination/treatment/procedure(s) were performed by non-physician practitioner and as supervising physician I was immediately available for consultation/collaboration. I agree with above. Tareq Dwan, MD   

## 2019-06-04 NOTE — Telephone Encounter (Signed)
Pt informed of below.  

## 2019-06-04 NOTE — Telephone Encounter (Signed)
Yes, as you have not had anaphylaxis that I am aware, so regular allergies is not considered a reason for avoiding the vaccine. thanks

## 2019-06-04 NOTE — Telephone Encounter (Signed)
Pt is calling and would like to know if dr Jenny Reichmann recommends him to get covid 19 vaccine once it becomes available for his age group. Pt has allergies

## 2019-07-25 ENCOUNTER — Ambulatory Visit (INDEPENDENT_AMBULATORY_CARE_PROVIDER_SITE_OTHER): Payer: Medicare Other | Admitting: Internal Medicine

## 2019-07-25 ENCOUNTER — Encounter: Payer: Self-pay | Admitting: Internal Medicine

## 2019-07-25 ENCOUNTER — Other Ambulatory Visit: Payer: Self-pay

## 2019-07-25 VITALS — BP 138/70 | HR 48 | Temp 98.1°F | Ht 71.0 in | Wt 259.6 lb

## 2019-07-25 DIAGNOSIS — E1142 Type 2 diabetes mellitus with diabetic polyneuropathy: Secondary | ICD-10-CM

## 2019-07-25 DIAGNOSIS — E538 Deficiency of other specified B group vitamins: Secondary | ICD-10-CM | POA: Diagnosis not present

## 2019-07-25 DIAGNOSIS — Z0000 Encounter for general adult medical examination without abnormal findings: Secondary | ICD-10-CM

## 2019-07-25 DIAGNOSIS — E611 Iron deficiency: Secondary | ICD-10-CM

## 2019-07-25 DIAGNOSIS — E559 Vitamin D deficiency, unspecified: Secondary | ICD-10-CM

## 2019-07-25 DIAGNOSIS — R001 Bradycardia, unspecified: Secondary | ICD-10-CM

## 2019-07-25 LAB — LIPID PANEL
Cholesterol: 88 mg/dL (ref 0–200)
HDL: 25.5 mg/dL — ABNORMAL LOW (ref >39.00)
LDL Cholesterol: 23 mg/dL (ref 0–99)
NonHDL: 62.11
Total CHOL/HDL Ratio: 3
Triglycerides: 197 mg/dL — ABNORMAL HIGH (ref 0.0–149.0)
VLDL: 39.4 mg/dL (ref 0.0–40.0)

## 2019-07-25 LAB — HEPATIC FUNCTION PANEL
ALT: 16 U/L (ref 0–53)
AST: 17 U/L (ref 0–37)
Albumin: 4 g/dL (ref 3.5–5.2)
Alkaline Phosphatase: 73 U/L (ref 39–117)
Bilirubin, Direct: 0.3 mg/dL (ref 0.0–0.3)
Total Bilirubin: 2 mg/dL — ABNORMAL HIGH (ref 0.2–1.2)
Total Protein: 6.6 g/dL (ref 6.0–8.3)

## 2019-07-25 LAB — CBC WITH DIFFERENTIAL/PLATELET
Basophils Absolute: 0 10*3/uL (ref 0.0–0.1)
Basophils Relative: 0.5 % (ref 0.0–3.0)
Eosinophils Absolute: 0.1 10*3/uL (ref 0.0–0.7)
Eosinophils Relative: 1.3 % (ref 0.0–5.0)
HCT: 43.2 % (ref 39.0–52.0)
Hemoglobin: 14.8 g/dL (ref 13.0–17.0)
Lymphocytes Relative: 27.5 % (ref 12.0–46.0)
Lymphs Abs: 2.5 10*3/uL (ref 0.7–4.0)
MCHC: 34.1 g/dL (ref 30.0–36.0)
MCV: 95.7 fl (ref 78.0–100.0)
Monocytes Absolute: 0.5 10*3/uL (ref 0.1–1.0)
Monocytes Relative: 6 % (ref 3.0–12.0)
Neutro Abs: 5.9 10*3/uL (ref 1.4–7.7)
Neutrophils Relative %: 64.7 % (ref 43.0–77.0)
Platelets: 142 10*3/uL — ABNORMAL LOW (ref 150.0–400.0)
RBC: 4.52 Mil/uL (ref 4.22–5.81)
RDW: 12.7 % (ref 11.5–15.5)
WBC: 9.1 10*3/uL (ref 4.0–10.5)

## 2019-07-25 LAB — IBC PANEL
Iron: 90 ug/dL (ref 42–165)
Saturation Ratios: 27.2 % (ref 20.0–50.0)
Transferrin: 236 mg/dL (ref 212.0–360.0)

## 2019-07-25 LAB — URINALYSIS, ROUTINE W REFLEX MICROSCOPIC
Bilirubin Urine: NEGATIVE
Hgb urine dipstick: NEGATIVE
Ketones, ur: NEGATIVE
Leukocytes,Ua: NEGATIVE
Nitrite: NEGATIVE
Specific Gravity, Urine: 1.015 (ref 1.000–1.030)
Total Protein, Urine: NEGATIVE
Urine Glucose: NEGATIVE
Urobilinogen, UA: 0.2 (ref 0.0–1.0)
pH: 5.5 (ref 5.0–8.0)

## 2019-07-25 LAB — MICROALBUMIN / CREATININE URINE RATIO
Creatinine,U: 11.8 mg/dL
Microalb Creat Ratio: 10 mg/g (ref 0.0–30.0)
Microalb, Ur: 1.2 mg/dL (ref 0.0–1.9)

## 2019-07-25 LAB — BASIC METABOLIC PANEL
BUN: 13 mg/dL (ref 6–23)
CO2: 33 mEq/L — ABNORMAL HIGH (ref 19–32)
Calcium: 10 mg/dL (ref 8.4–10.5)
Chloride: 101 mEq/L (ref 96–112)
Creatinine, Ser: 1.18 mg/dL (ref 0.40–1.50)
GFR: 60.28 mL/min (ref >60.00)
Glucose, Bld: 162 mg/dL — ABNORMAL HIGH (ref 70–99)
Potassium: 4.1 mEq/L (ref 3.5–5.1)
Sodium: 139 mEq/L (ref 135–145)

## 2019-07-25 LAB — VITAMIN D 25 HYDROXY (VIT D DEFICIENCY, FRACTURES): VITD: 34.06 ng/mL (ref 30.00–100.00)

## 2019-07-25 LAB — VITAMIN B12: Vitamin B-12: 211 pg/mL (ref 211–911)

## 2019-07-25 LAB — HEMOGLOBIN A1C: Hgb A1c MFr Bld: 8.1 % — ABNORMAL HIGH (ref 4.6–6.5)

## 2019-07-25 LAB — TSH: TSH: 1.91 u[IU]/mL (ref 0.35–4.50)

## 2019-07-25 LAB — PSA: PSA: 1.99 ng/mL (ref 0.10–4.00)

## 2019-07-25 MED ORDER — OMEPRAZOLE 20 MG PO CPDR: 20.0000 mg | DELAYED_RELEASE_CAPSULE | Freq: Two times a day (BID) | ORAL | 3 refills | Status: DC

## 2019-07-25 MED ORDER — FUROSEMIDE 40 MG PO TABS: 40.0000 mg | ORAL_TABLET | Freq: Every day | ORAL | 3 refills | Status: DC | PRN

## 2019-07-25 MED ORDER — GLIPIZIDE ER 10 MG PO TB24: ORAL_TABLET | ORAL | 3 refills | Status: DC

## 2019-07-25 MED ORDER — ROPINIROLE HCL 3 MG PO TABS: 9.0000 mg | ORAL_TABLET | Freq: Every day | ORAL | 11 refills | Status: DC

## 2019-07-25 MED ORDER — CARVEDILOL 3.125 MG PO TABS: 3.1250 mg | ORAL_TABLET | Freq: Every day | ORAL | 4 refills | Status: DC

## 2019-07-25 MED ORDER — POTASSIUM CHLORIDE ER 8 MEQ PO TBCR: 8.0000 meq | EXTENDED_RELEASE_TABLET | Freq: Every day | ORAL | 3 refills | Status: DC

## 2019-07-25 MED ORDER — AMLODIPINE BESYLATE 5 MG PO TABS: 5.0000 mg | ORAL_TABLET | Freq: Every day | ORAL | 3 refills | Status: DC

## 2019-07-25 MED ORDER — METFORMIN HCL 1000 MG PO TABS: 1000.0000 mg | ORAL_TABLET | Freq: Two times a day (BID) | ORAL | 3 refills | Status: DC

## 2019-07-25 MED ORDER — ALLOPURINOL 300 MG PO TABS: 300.0000 mg | ORAL_TABLET | Freq: Every day | ORAL | 3 refills | Status: DC

## 2019-07-25 MED ORDER — LANTUS SOLOSTAR 100 UNIT/ML ~~LOC~~ SOPN: 45.0000 [IU] | PEN_INJECTOR | Freq: Every day | SUBCUTANEOUS | 5 refills | Status: DC

## 2019-07-25 MED ORDER — ROSUVASTATIN CALCIUM 10 MG PO TABS: 10.0000 mg | ORAL_TABLET | Freq: Every day | ORAL | 3 refills | Status: DC

## 2019-07-25 NOTE — Patient Instructions (Signed)

## 2019-07-25 NOTE — Progress Notes (Signed)
Subjective:    Patient ID: Aaron Maldonado, male    DOB: 1945/06/04, 74 y.o.   MRN: [id number]  HPI  Here for wellness and f/u;  Overall doing ok;  Pt denies Chest pain, worsening SOB, DOE, wheezing, orthopnea, PND, worsening LE edema, palpitations, dizziness or syncope.  Pt denies neurological change such as new headache, facial or extremity weakness.  Pt denies polydipsia, polyuria, or low sugar symptoms. Pt states overall good compliance with treatment and medications, good tolerability, and has been trying to follow appropriate diet.  Pt denies worsening depressive symptoms, suicidal ideation or panic. No fever, night sweats, wt loss, loss of appetite, or other constitutional symptoms.  Pt states good ability with ADL's, has low fall risk, home safety reviewed and adequate, no other significant changes in hearing or vision, and only occasionally active with exercise. No new compliants Past Medical History:  Diagnosis Date  . Abnormal nuclear stress test 12/29/2013  . Allergy   . Arthritis   . Chest pain   . CHF (congestive heart failure) ([place])   . Chicken pox   . Colon polyps   . Coronary artery disease   . Depression   . Diabetes mellitus without complication ([place])   . Diverticulitis   . Dyspnea on exertion   . GERD (gastroesophageal reflux disease)   . Heart murmur   . History of heart surgery   . Hyperlipidemia   . Hypertension   . Stroke [facility])    Past Surgical History:  Procedure Laterality Date  . COLONOSCOPY    . KNEE SURGERY    . LEFT HEART CATHETERIZATION WITH CORONARY ANGIOGRAM N/A 01/06/2014   Procedure: LEFT HEART CATHETERIZATION WITH CORONARY ANGIOGRAM;  Surgeon: Lorretta Harp, MD;  Location: [facility] CATH LAB;  Service: Cardiovascular;  Laterality: N/A;  . SHOULDER SURGERY    . TONSILLECTOMY      reports that he quit smoking about 31 years ago. His smoking use included cigarettes. He has a 64.50 pack-year smoking history. He has never used smokeless tobacco. He  reports that he does not drink alcohol or use drugs. family history includes Alzheimer's disease in his father; Breast cancer in his sister; Heart disease in his mother; Heart failure in his mother; Hyperlipidemia in his maternal grandfather and maternal grandmother; Hypertension in his maternal grandfather, maternal grandmother, paternal grandfather, and paternal grandmother; 68 / [place] in his maternal grandfather and maternal grandmother; Mitral valve prolapse in his mother; SIDS in his daughter. Allergies  Allergen Reactions  . Keflex [Cephalexin]     unknown  . Lisinopril Cough  . Penicillins Rash    Has patient had a PCN reaction causing immediate rash, facial/tongue/throat swelling, SOB or lightheadedness with hypotension: No Has patient had a PCN reaction causing severe rash involving mucus membranes or skin necrosis: No Has patient had a PCN reaction that required hospitalization: No Has patient had a PCN reaction occurring within the last 10 years: No If all of the above answers are "NO", then may proceed with Cephalosporin use.   . Sulfa Antibiotics Rash    rash  . Sulfasalazine Rash    rash   Current Outpatient Medications on File Prior to Visit  Medication Sig Dispense Refill  . aspirin 81 MG tablet Take 81 mg by mouth daily.    . calcium carbonate (OS-CAL) 600 MG TABS tablet Take 600 mg by mouth daily.    . cetirizine (ZYRTEC) 10 MG tablet Take 10 mg by mouth daily.    Marland Kitchen  Continuous Blood Gluc Receiver (FREESTYLE LIBRE 14 DAY READER) DEVI 1 Device by Does not apply route 4 (four) times daily. E11.9 1 Device 0  . Continuous Blood Gluc Sensor (FREESTYLE LIBRE SENSOR SYSTEM) MISC 1 Device by Does not apply route every 14 (fourteen) days. E11.9 6 each 3  . gabapentin (NEURONTIN) 300 MG capsule Take 600 mg by mouth 2 (two) times daily.     Marland Kitchen HYDROcodone-acetaminophen (NORCO/VICODIN) 5-325 MG tablet Take 1 tablet by mouth 2 (two) times daily as needed for moderate  pain. 14 tablet 0  . Insulin Pen Needle (BD PEN NEEDLE NANO U/F) 32G X 4 MM MISC 1 with pen use daily 100 each 3  . nitroGLYCERIN (NITROSTAT) 0.4 MG SL tablet Place 1 tablet (0.4 mg total) under the tongue every 5 (five) minutes as needed for chest pain. 90 tablet 3   No current facility-administered medications on file prior to visit.   Review of Systems All otherwise neg per pt     Objective:   Physical Exam BP 138/70   Pulse (!) 48   Temp 98.1 F (36.7 C)   Ht 5\' 11"  (1.803 m)   Wt 259 lb 9.6 oz (117.8 kg)   SpO2 99%   BMI 36.21 kg/m  VS noted,  Constitutional: Pt appears in NAD HENT: Head: NCAT.  Right Ear: External ear normal.  Left Ear: External ear normal.  Eyes: . Pupils are equal, round, and reactive to light. Conjunctivae and EOM are normal Nose: without d/c or deformity Neck: Neck supple. Gross normal ROM Cardiovascular: Normal rate and regular rhythm.   Pulmonary/Chest: Effort normal and breath sounds without rales or wheezing.  Abd:  Soft, NT, ND, + BS, no organomegaly Neurological: Pt is alert. At baseline orientation, motor grossly intact Skin: Skin is warm. No rashes, other new lesions, no LE edema Psychiatric: Pt behavior is normal without agitation  All otherwise neg per pt Lab Results  Component Value Date   WBC 9.1 07/25/2019   HGB 14.8 07/25/2019   HCT 43.2 07/25/2019   PLT 142.0 (L) 07/25/2019   GLUCOSE 162 (H) 07/25/2019   CHOL 88 07/25/2019   TRIG 197.0 (H) 07/25/2019   HDL 25.50 (L) 07/25/2019   LDLDIRECT 54.0 09/24/2018   LDLCALC 23 07/25/2019   ALT 16 07/25/2019   AST 17 07/25/2019   NA 139 07/25/2019   K 4.1 07/25/2019   CL 101 07/25/2019   CREATININE 1.18 07/25/2019   BUN 13 07/25/2019   CO2 33 (H) 07/25/2019   TSH 1.91 07/25/2019   PSA 1.99 07/25/2019   INR 0.93 12/29/2013   HGBA1C 8.1 (H) 07/25/2019   MICROALBUR 1.2 07/25/2019         Assessment & Plan:

## 2019-07-26 ENCOUNTER — Encounter: Payer: Self-pay | Admitting: Internal Medicine

## 2019-07-26 ENCOUNTER — Other Ambulatory Visit: Payer: Self-pay | Admitting: Internal Medicine

## 2019-07-26 DIAGNOSIS — R001 Bradycardia, unspecified: Secondary | ICD-10-CM | POA: Insufficient documentation

## 2019-07-26 MED ORDER — LANTUS SOLOSTAR 100 UNIT/ML ~~LOC~~ SOPN: 55.0000 [IU] | PEN_INJECTOR | Freq: Every day | SUBCUTANEOUS | 5 refills | Status: DC

## 2019-07-26 NOTE — Assessment & Plan Note (Signed)

## 2019-07-26 NOTE — Assessment & Plan Note (Signed)
stable overall by history and exam, recent data reviewed with pt, and pt to continue medical treatment as before,  to f/u any worsening symptoms or concerns  

## 2019-07-26 NOTE — Assessment & Plan Note (Signed)
Mild asympt, cont same tx

## 2020-01-09 DIAGNOSIS — J069 Acute upper respiratory infection, unspecified: Secondary | ICD-10-CM | POA: Diagnosis not present

## 2020-01-09 DIAGNOSIS — Z20822 Contact with and (suspected) exposure to covid-19: Secondary | ICD-10-CM | POA: Diagnosis not present

## 2020-01-27 ENCOUNTER — Ambulatory Visit: Payer: Medicare Other | Admitting: Internal Medicine

## 2020-01-27 DIAGNOSIS — Z0289 Encounter for other administrative examinations: Secondary | ICD-10-CM

## 2020-04-26 ENCOUNTER — Telehealth (INDEPENDENT_AMBULATORY_CARE_PROVIDER_SITE_OTHER): Payer: Medicare Other | Admitting: Internal Medicine

## 2020-04-26 DIAGNOSIS — I10 Essential (primary) hypertension: Secondary | ICD-10-CM

## 2020-04-26 DIAGNOSIS — E1142 Type 2 diabetes mellitus with diabetic polyneuropathy: Secondary | ICD-10-CM

## 2020-04-26 NOTE — Progress Notes (Signed)
Patient ID: Aaron Maldonado, male   DOB: 01/23/1946, 74 y.o.   MRN: [id number]  Virtual Visit via Video Note  I connected with Aaron Maldonado on 04/26/20 at  1:40 PM EST by a video enabled telemedicine application and verified that I am speaking with the correct person using two identifiers.  Location of all participants today Patient: at home Provider: at office   I discussed the limitations of evaluation and management by telemedicine and the availability of in person appointments. The patient expressed understanding and agreed to proceed.  History of Present Illness: Here to f/u; overall doing ok,  Pt denies chest pain, increasing sob or doe, wheezing, orthopnea, PND, increased LE swelling, palpitations, dizziness or syncope.  Pt denies new neurological symptoms such as new headache, or facial or extremity weakness or numbness.  Pt denies polydipsia, polyuria, or low sugar episode.  Pt states overall good compliance with meds, now covid infection resolved including diarrhea.  Pt has f/u at [facility] for booster.  bp < 140/90 athome Past Medical History:  Diagnosis Date  . Abnormal nuclear stress test 12/29/2013  . Allergy   . Arthritis   . Chest pain   . CHF (congestive heart failure) ([place])   . Chicken pox   . Colon polyps   . Coronary artery disease   . Depression   . Diabetes mellitus without complication ([place])   . Diverticulitis   . Dyspnea on exertion   . GERD (gastroesophageal reflux disease)   . Heart murmur   . History of heart surgery   . Hyperlipidemia   . Hypertension   . Stroke [facility])    Past Surgical History:  Procedure Laterality Date  . COLONOSCOPY    . KNEE SURGERY    . LEFT HEART CATHETERIZATION WITH CORONARY ANGIOGRAM N/A 01/06/2014   Procedure: LEFT HEART CATHETERIZATION WITH CORONARY ANGIOGRAM;  Surgeon: Lorretta Harp, MD;  Location: [facility] CATH LAB;  Service: Cardiovascular;  Laterality: N/A;  . SHOULDER SURGERY    . TONSILLECTOMY      reports that he quit  smoking about 31 years ago. His smoking use included cigarettes. He has a 64.50 pack-year smoking history. He has never used smokeless tobacco. He reports that he does not drink alcohol and does not use drugs. family history includes Alzheimer's disease in his father; Breast cancer in his sister; Heart disease in his mother; Heart failure in his mother; Hyperlipidemia in his maternal grandfather and maternal grandmother; Hypertension in his maternal grandfather, maternal grandmother, paternal grandfather, and paternal grandmother; 78 / [place] in his maternal grandfather and maternal grandmother; Mitral valve prolapse in his mother; SIDS in his daughter. Allergies  Allergen Reactions  . Keflex [Cephalexin]     unknown  . Lisinopril Cough  . Penicillins Rash    Has patient had a PCN reaction causing immediate rash, facial/tongue/throat swelling, SOB or lightheadedness with hypotension: No Has patient had a PCN reaction causing severe rash involving mucus membranes or skin necrosis: No Has patient had a PCN reaction that required hospitalization: No Has patient had a PCN reaction occurring within the last 10 years: No If all of the above answers are "NO", then may proceed with Cephalosporin use.   . Sulfa Antibiotics Rash    rash  . Sulfasalazine Rash    rash   Current Outpatient Medications on File Prior to Visit  Medication Sig Dispense Refill  . allopurinol (ZYLOPRIM) 300 MG tablet Take 1 tablet (300 mg total) by mouth daily. 90 tablet  3  . amLODipine (NORVASC) 5 MG tablet Take 1 tablet (5 mg total) by mouth daily. 90 tablet 3  . aspirin 81 MG tablet Take 81 mg by mouth daily.    . calcium carbonate (OS-CAL) 600 MG TABS tablet Take 600 mg by mouth daily.    . carvedilol (COREG) 3.125 MG tablet Take 1 tablet (3.125 mg total) by mouth daily. 90 tablet 4  . cetirizine (ZYRTEC) 10 MG tablet Take 10 mg by mouth daily.    . Continuous Blood Gluc Receiver (FREESTYLE LIBRE 14 DAY  READER) DEVI 1 Device by Does not apply route 4 (four) times daily. E11.9 1 Device 0  . Continuous Blood Gluc Sensor (FREESTYLE LIBRE SENSOR SYSTEM) MISC 1 Device by Does not apply route every 14 (fourteen) days. E11.9 6 each 3  . furosemide (LASIX) 40 MG tablet Take 1 tablet (40 mg total) by mouth daily as needed for edema. 90 tablet 3  . gabapentin (NEURONTIN) 300 MG capsule Take 600 mg by mouth 2 (two) times daily.     Marland Kitchen glipiZIDE (GLUCOTROL XL) 10 MG 24 hr tablet TAKE 1 TABLET BY MOUTH ONCE DAILY WITH BREAKFAST 90 tablet 3  . HYDROcodone-acetaminophen (NORCO/VICODIN) 5-325 MG tablet Take 1 tablet by mouth 2 (two) times daily as needed for moderate pain. 14 tablet 0  . insulin glargine (LANTUS SOLOSTAR) 100 UNIT/ML Solostar Pen Inject 55 Units into the skin daily at 10 pm. 15 mL 5  . Insulin Pen Needle (BD PEN NEEDLE NANO U/F) 32G X 4 MM MISC 1 with pen use daily 100 each 3  . metFORMIN (GLUCOPHAGE) 1000 MG tablet Take 1 tablet (1,000 mg total) by mouth 2 (two) times daily with a meal. 180 tablet 3  . nitroGLYCERIN (NITROSTAT) 0.4 MG SL tablet Place 1 tablet (0.4 mg total) under the tongue every 5 (five) minutes as needed for chest pain. 90 tablet 3  . omeprazole (PRILOSEC) 20 MG capsule Take 1 capsule (20 mg total) by mouth 2 (two) times daily before a meal. 180 capsule 3  . potassium chloride (KLOR-CON) 8 MEQ tablet Take 1 tablet (8 mEq total) by mouth daily. 90 tablet 3  . rOPINIRole (REQUIP) 3 MG tablet Take 3 tablets (9 mg total) by mouth at bedtime. 90 tablet 11  . rosuvastatin (CRESTOR) 10 MG tablet Take 1 tablet (10 mg total) by mouth daily. 90 tablet 3   No current facility-administered medications on file prior to visit.    Observations/Objective: Alert, NAD, appropriate mood and affect, resps normal, cn 2-12 intact, moves all 4s, no visible rash or swelling Lab Results  Component Value Date   WBC 9.1 07/25/2019   HGB 14.8 07/25/2019   HCT 43.2 07/25/2019   PLT 142.0 (L)  07/25/2019   GLUCOSE 162 (H) 07/25/2019   CHOL 88 07/25/2019   TRIG 197.0 (H) 07/25/2019   HDL 25.50 (L) 07/25/2019   LDLDIRECT 54.0 09/24/2018   LDLCALC 23 07/25/2019   ALT 16 07/25/2019   AST 17 07/25/2019   NA 139 07/25/2019   K 4.1 07/25/2019   CL 101 07/25/2019   CREATININE 1.18 07/25/2019   BUN 13 07/25/2019   CO2 33 (H) 07/25/2019   TSH 1.91 07/25/2019   PSA 1.99 07/25/2019   INR 0.93 12/29/2013   HGBA1C 8.1 (H) 07/25/2019   MICROALBUR 1.2 07/25/2019   Assessment and Plan: See notes  Follow Up Instructions: See notes   I discussed the assessment and treatment plan with the patient. The patient  was provided an opportunity to ask questions and all were answered. The patient agreed with the plan and demonstrated an understanding of the instructions.   The patient was advised to call back or seek an in-person evaluation if the symptoms worsen or if the condition fails to improve as anticipated.  Cathlean Cower, MD

## 2020-05-01 ENCOUNTER — Encounter: Payer: Self-pay | Admitting: Internal Medicine

## 2020-05-01 NOTE — Patient Instructions (Signed)
Please continue all other medications as before, and refills have been done if requested.  Please have the pharmacy call with any other refills you may need.  Please continue your efforts at being more active, low cholesterol diet, and weight control.  Please keep your appointments with your specialists as you may have planned     

## 2020-05-01 NOTE — Assessment & Plan Note (Addendum)
stable overall by history and exam, recent data reviewed with pt, and pt to continue medical treatment as before,  to f/u any worsening symptoms or concerns  I spent 21 minutes in preparing to see the patient by review of recent labs, imaging and procedures, obtaining and reviewing separately obtained history, communicating with the patient and family or caregiver, ordering medications, tests or procedures, and documenting clinical information in the EHR including the differential Dx, treatment, and any further evaluation and other management of dm, htn

## 2020-05-01 NOTE — Assessment & Plan Note (Signed)
stable overall by history and exam, recent data reviewed with pt, and pt to continue medical treatment as before,  to f/u any worsening symptoms or concerns  

## 2020-05-10 ENCOUNTER — Telehealth: Payer: Self-pay | Admitting: Internal Medicine

## 2020-05-10 DIAGNOSIS — U071 COVID-19: Secondary | ICD-10-CM | POA: Diagnosis not present

## 2020-05-10 NOTE — Telephone Encounter (Signed)
   Patient calling to report he tested positive for COVID today. Patient has questions about getting an infusion  Please advise

## 2020-05-11 NOTE — Telephone Encounter (Signed)
Sent to Dr. John. 

## 2020-05-11 NOTE — Telephone Encounter (Signed)
Pts information has been sent over to the infusion team to be added to the list.

## 2020-05-20 DIAGNOSIS — S52125A Nondisplaced fracture of head of left radius, initial encounter for closed fracture: Secondary | ICD-10-CM | POA: Diagnosis not present

## 2020-05-20 DIAGNOSIS — W19XXXA Unspecified fall, initial encounter: Secondary | ICD-10-CM | POA: Diagnosis not present

## 2020-05-20 DIAGNOSIS — M25512 Pain in left shoulder: Secondary | ICD-10-CM | POA: Diagnosis not present

## 2020-05-24 DIAGNOSIS — S52125A Nondisplaced fracture of head of left radius, initial encounter for closed fracture: Secondary | ICD-10-CM | POA: Diagnosis not present

## 2020-06-18 DIAGNOSIS — S52125D Nondisplaced fracture of head of left radius, subsequent encounter for closed fracture with routine healing: Secondary | ICD-10-CM | POA: Diagnosis not present

## 2020-08-19 ENCOUNTER — Ambulatory Visit (INDEPENDENT_AMBULATORY_CARE_PROVIDER_SITE_OTHER): Payer: Medicare Other | Admitting: Internal Medicine

## 2020-08-19 ENCOUNTER — Encounter: Payer: Self-pay | Admitting: Internal Medicine

## 2020-08-19 ENCOUNTER — Other Ambulatory Visit: Payer: Self-pay

## 2020-08-19 VITALS — BP 120/66 | HR 67 | Ht 71.0 in | Wt 256.0 lb

## 2020-08-19 DIAGNOSIS — Z0001 Encounter for general adult medical examination with abnormal findings: Secondary | ICD-10-CM

## 2020-08-19 DIAGNOSIS — F5101 Primary insomnia: Secondary | ICD-10-CM

## 2020-08-19 DIAGNOSIS — S52102A Unspecified fracture of upper end of left radius, initial encounter for closed fracture: Secondary | ICD-10-CM | POA: Insufficient documentation

## 2020-08-19 DIAGNOSIS — E538 Deficiency of other specified B group vitamins: Secondary | ICD-10-CM

## 2020-08-19 DIAGNOSIS — E611 Iron deficiency: Secondary | ICD-10-CM | POA: Diagnosis not present

## 2020-08-19 DIAGNOSIS — E1142 Type 2 diabetes mellitus with diabetic polyneuropathy: Secondary | ICD-10-CM | POA: Diagnosis not present

## 2020-08-19 DIAGNOSIS — E782 Mixed hyperlipidemia: Secondary | ICD-10-CM | POA: Diagnosis not present

## 2020-08-19 DIAGNOSIS — I10 Essential (primary) hypertension: Secondary | ICD-10-CM | POA: Diagnosis not present

## 2020-08-19 DIAGNOSIS — S52182S Other fracture of upper end of left radius, sequela: Secondary | ICD-10-CM

## 2020-08-19 DIAGNOSIS — E559 Vitamin D deficiency, unspecified: Secondary | ICD-10-CM

## 2020-08-19 DIAGNOSIS — M792 Neuralgia and neuritis, unspecified: Secondary | ICD-10-CM | POA: Diagnosis not present

## 2020-08-19 DIAGNOSIS — N183 Chronic kidney disease, stage 3 unspecified: Secondary | ICD-10-CM | POA: Insufficient documentation

## 2020-08-19 DIAGNOSIS — G4700 Insomnia, unspecified: Secondary | ICD-10-CM | POA: Insufficient documentation

## 2020-08-19 LAB — URINALYSIS, ROUTINE W REFLEX MICROSCOPIC
Bilirubin Urine: NEGATIVE
Hgb urine dipstick: NEGATIVE
Leukocytes,Ua: NEGATIVE
Nitrite: NEGATIVE
Specific Gravity, Urine: 1.025 (ref 1.000–1.030)
Total Protein, Urine: 30 — AB
Urine Glucose: 250 — AB
Urobilinogen, UA: 0.2 (ref 0.0–1.0)
pH: 5.5 (ref 5.0–8.0)

## 2020-08-19 LAB — HEMOGLOBIN A1C: Hgb A1c MFr Bld: 7.4 % — ABNORMAL HIGH (ref 4.6–6.5)

## 2020-08-19 LAB — CBC WITH DIFFERENTIAL/PLATELET
Basophils Absolute: 0 10*3/uL (ref 0.0–0.1)
Basophils Relative: 0.3 % (ref 0.0–3.0)
Eosinophils Absolute: 0.2 10*3/uL (ref 0.0–0.7)
Eosinophils Relative: 1.8 % (ref 0.0–5.0)
HCT: 40.7 % (ref 39.0–52.0)
Hemoglobin: 13.6 g/dL (ref 13.0–17.0)
Lymphocytes Relative: 22.7 % (ref 12.0–46.0)
Lymphs Abs: 2.1 10*3/uL (ref 0.7–4.0)
MCHC: 33.4 g/dL (ref 30.0–36.0)
MCV: 96.5 fl (ref 78.0–100.0)
Monocytes Absolute: 0.6 10*3/uL (ref 0.1–1.0)
Monocytes Relative: 6.8 % (ref 3.0–12.0)
Neutro Abs: 6.3 10*3/uL (ref 1.4–7.7)
Neutrophils Relative %: 68.4 % (ref 43.0–77.0)
Platelets: 172 10*3/uL (ref 150.0–400.0)
RBC: 4.22 Mil/uL (ref 4.22–5.81)
RDW: 13.4 % (ref 11.5–15.5)
WBC: 9.1 10*3/uL (ref 4.0–10.5)

## 2020-08-19 LAB — HEPATIC FUNCTION PANEL
ALT: 19 U/L (ref 0–53)
AST: 20 U/L (ref 0–37)
Albumin: 4 g/dL (ref 3.5–5.2)
Alkaline Phosphatase: 63 U/L (ref 39–117)
Bilirubin, Direct: 0.2 mg/dL (ref 0.0–0.3)
Total Bilirubin: 1.2 mg/dL (ref 0.2–1.2)
Total Protein: 6.5 g/dL (ref 6.0–8.3)

## 2020-08-19 LAB — BASIC METABOLIC PANEL
BUN: 20 mg/dL (ref 6–23)
CO2: 31 mEq/L (ref 19–32)
Calcium: 9.9 mg/dL (ref 8.4–10.5)
Chloride: 102 mEq/L (ref 96–112)
Creatinine, Ser: 1.39 mg/dL (ref 0.40–1.50)
GFR: 49.61 mL/min — ABNORMAL LOW (ref >60.00)
Glucose, Bld: 244 mg/dL — ABNORMAL HIGH (ref 70–99)
Potassium: 4.5 mEq/L (ref 3.5–5.1)
Sodium: 139 mEq/L (ref 135–145)

## 2020-08-19 LAB — LDL CHOLESTEROL, DIRECT: Direct LDL: 52 mg/dL

## 2020-08-19 LAB — VITAMIN B12: Vitamin B-12: 211 pg/mL (ref 211–911)

## 2020-08-19 LAB — VITAMIN D 25 HYDROXY (VIT D DEFICIENCY, FRACTURES): VITD: 33.04 ng/mL (ref 30.00–100.00)

## 2020-08-19 LAB — LIPID PANEL
Cholesterol: 97 mg/dL (ref 0–200)
HDL: 26.8 mg/dL — ABNORMAL LOW (ref >39.00)
NonHDL: 70.27
Total CHOL/HDL Ratio: 4
Triglycerides: 230 mg/dL — ABNORMAL HIGH (ref 0.0–149.0)
VLDL: 46 mg/dL — ABNORMAL HIGH (ref 0.0–40.0)

## 2020-08-19 LAB — TSH: TSH: 2.26 u[IU]/mL (ref 0.35–4.50)

## 2020-08-19 LAB — MICROALBUMIN / CREATININE URINE RATIO
Creatinine,U: 173.7 mg/dL
Microalb Creat Ratio: 7.6 mg/g (ref 0.0–30.0)
Microalb, Ur: 13.3 mg/dL — ABNORMAL HIGH (ref 0.0–1.9)

## 2020-08-19 LAB — PSA: PSA: 1.71 ng/mL (ref 0.10–4.00)

## 2020-08-19 MED ORDER — GABAPENTIN 600 MG PO TABS: ORAL_TABLET | ORAL | 1 refills | Status: DC

## 2020-08-19 NOTE — Patient Instructions (Addendum)
Ok to change the gabapentin to 600 mg twice per day with 1200 mg also at bedtime  Please schedule the bone density test before leaving today at the scheduling desk (where you check out)  Please continue all other medications as before, and refills have been done if requested.  Please have the pharmacy call with any other refills you may need.  Please continue your efforts at being more active, low cholesterol diet, and weight control.  You are otherwise up to date with prevention measures today.  Please keep your appointments with your specialists as you may have planned  Please go to the LAB at the blood drawing area for the tests to be done  You will be contacted by phone if any changes need to be made immediately.  Otherwise, you will receive a letter about your results with an explanation, but please check with MyChart first.  Please remember to sign up for MyChart if you have not done so, as this will be important to you in the future with finding out test results, communicating by private email, and scheduling acute appointments online when needed.  Please make an Appointment to return in 6 months, or sooner if needed

## 2020-08-19 NOTE — Progress Notes (Signed)
Patient ID: Aaron Maldonado, male   DOB: 1946-05-03, 75 y.o.   MRN: [id number]         Chief Complaint:: wellness exam and Blood Sugar Problem with a letter he got from the [place]       HPI:  Aaron Maldonado is a 75 y.o. male here for wellness exam; up to date with preventive referrals and immunizations.                        Also received a letter he is concerned from the [place] indicating a change in manufacturer for glargine and was not sure what it meant.  Also had a recent fall several months ago with a proximal radial fx now healed, but has not had dxa.  No other recent fractures or falls.  Pt denies chest pain, increased sob or doe, wheezing, orthopnea, PND, increased LE swelling, palpitations, dizziness or syncope.   Pt denies polydipsia, polyuria  Denies other new new focal neuro s/s. But has ongoing bilat LE neuropathic pain worse at night, also with worsening ability to get to sleep for several months.   Pt denies fever, wt loss, night sweats, loss of appetite, or other constitutional symptoms  No other enw complaints     Wt Readings from Last 3 Encounters:  08/19/20 256 lb (116.1 kg)  07/25/19 259 lb 9.6 oz (117.8 kg)  03/27/19 256 lb (116.1 kg)   BP Readings from Last 3 Encounters:  08/19/20 120/66  07/25/19 138/70  03/27/19 124/76   Immunization History  Administered Date(s) Administered  . Fluad Quad(high Dose 65+) 02/01/2019  . Influenza-Unspecified 03/13/2017, 01/20/2018  . Moderna Sars-Covid-2 Vaccination 07/22/2019  . Pneumococcal Conjugate-13 03/23/2016  . Pneumococcal Polysaccharide-23 09/21/2017  . Tdap 03/23/2017   There are no preventive care reminders to display for this patient.    Past Medical History:  Diagnosis Date  . Abnormal nuclear stress test 12/29/2013  . Allergy   . Arthritis   . Chest pain   . CHF (congestive heart failure) ([place])   . Chicken pox   . Colon polyps   . Coronary artery disease   . Depression   . Diabetes mellitus without  complication ([place])   . Diverticulitis   . Dyspnea on exertion   . GERD (gastroesophageal reflux disease)   . Heart murmur   . History of heart surgery   . Hyperlipidemia   . Hypertension   . Stroke [facility])    Past Surgical History:  Procedure Laterality Date  . COLONOSCOPY    . KNEE SURGERY    . LEFT HEART CATHETERIZATION WITH CORONARY ANGIOGRAM N/A 01/06/2014   Procedure: LEFT HEART CATHETERIZATION WITH CORONARY ANGIOGRAM;  Surgeon: Lorretta Harp, MD;  Location: [facility] CATH LAB;  Service: Cardiovascular;  Laterality: N/A;  . SHOULDER SURGERY    . TONSILLECTOMY      reports that he quit smoking about 32 years ago. His smoking use included cigarettes. He has a 64.50 pack-year smoking history. He has never used smokeless tobacco. He reports that he does not drink alcohol and does not use drugs. family history includes Alzheimer's disease in his father; Breast cancer in his sister; Heart disease in his mother; Heart failure in his mother; Hyperlipidemia in his maternal grandfather and maternal grandmother; Hypertension in his maternal grandfather, maternal grandmother, paternal grandfather, and paternal grandmother; 66 / [place] in his maternal grandfather and maternal grandmother; Mitral valve prolapse in his mother; SIDS in his daughter. Allergies  Allergen Reactions  . Keflex [Cephalexin]     unknown  . Lisinopril Cough  . Penicillins Rash    Has patient had a PCN reaction causing immediate rash, facial/tongue/throat swelling, SOB or lightheadedness with hypotension: No Has patient had a PCN reaction causing severe rash involving mucus membranes or skin necrosis: No Has patient had a PCN reaction that required hospitalization: No Has patient had a PCN reaction occurring within the last 10 years: No If all of the above answers are "NO", then may proceed with Cephalosporin use.   . Sulfa Antibiotics Rash    rash  . Sulfasalazine Rash    rash   Current Outpatient  Medications on File Prior to Visit  Medication Sig Dispense Refill  . acetaminophen (TYLENOL) 500 MG tablet TAKE ONE TABLET BY MOUTH THREE TIMES A DAY AS NEEDED FOR MILD TO MODERATE PAIN, FEVER, BODY ACHES, HEADACHE    . Alcohol Swabs (ALCOHOL WIPES) 70 % PADS USE FOR INSULIN INJECTIONS OR TO FINGER STICK AS DIRECTED    . allopurinol (ZYLOPRIM) 300 MG tablet Take 1 tablet (300 mg total) by mouth daily. 90 tablet 3  . amLODipine (NORVASC) 5 MG tablet Take 1 tablet (5 mg total) by mouth daily. 90 tablet 3  . aspirin 81 MG tablet Take 81 mg by mouth daily.    . calcium carbonate (OS-CAL) 600 MG TABS tablet Take 600 mg by mouth daily.    . carvedilol (COREG) 3.125 MG tablet Take 1 tablet (3.125 mg total) by mouth daily. 90 tablet 4  . cetirizine (ZYRTEC) 10 MG tablet Take 10 mg by mouth daily.    . Continuous Blood Gluc Receiver (FREESTYLE LIBRE 14 DAY READER) DEVI 1 Device by Does not apply route 4 (four) times daily. E11.9 1 Device 0  . Continuous Blood Gluc Sensor (FREESTYLE LIBRE SENSOR SYSTEM) MISC 1 Device by Does not apply route every 14 (fourteen) days. E11.9 6 each 3  . furosemide (LASIX) 40 MG tablet Take 1 tablet (40 mg total) by mouth daily as needed for edema. 90 tablet 3  . glipiZIDE (GLUCOTROL XL) 10 MG 24 hr tablet TAKE 1 TABLET BY MOUTH ONCE DAILY WITH BREAKFAST 90 tablet 3  . HYDROcodone-acetaminophen (NORCO/VICODIN) 5-325 MG tablet Take 1 tablet by mouth 2 (two) times daily as needed for moderate pain. 14 tablet 0  . insulin glargine (LANTUS SOLOSTAR) 100 UNIT/ML Solostar Pen Inject 55 Units into the skin daily at 10 pm. 15 mL 5  . Insulin Pen Needle (BD PEN NEEDLE NANO U/F) 32G X 4 MM MISC 1 with pen use daily 100 each 3  . metFORMIN (GLUCOPHAGE) 1000 MG tablet Take 1 tablet (1,000 mg total) by mouth 2 (two) times daily with a meal. 180 tablet 3  . nitroGLYCERIN (NITROSTAT) 0.4 MG SL tablet Place 1 tablet (0.4 mg total) under the tongue every 5 (five) minutes as needed for chest  pain. 90 tablet 3  . Omega-3 Fatty Acids (FISH OIL) 1000 MG CAPS TAKE TWO CAPSULES BY MOUTH DAILY ELEVATED TRIGLYCERIDES    . omeprazole (PRILOSEC) 20 MG capsule Take 1 capsule (20 mg total) by mouth 2 (two) times daily before a meal. 180 capsule 3  . PARoxetine (PAXIL) 40 MG tablet Take 0.5 tablets by mouth daily.    . potassium chloride (KLOR-CON) 8 MEQ tablet Take 1 tablet (8 mEq total) by mouth daily. 90 tablet 3  . rOPINIRole (REQUIP) 3 MG tablet Take 3 tablets (9 mg total) by mouth at bedtime. [place]  tablet 11  . rosuvastatin (CRESTOR) 10 MG tablet Take 1 tablet (10 mg total) by mouth daily. 90 tablet 3  . tamsulosin (FLOMAX) 0.4 MG CAPS capsule TAKE ONE CAPSULE BY MOUTH ONCE A DAY FOR URINATION AND PROSTATE     No current facility-administered medications on file prior to visit.        ROS:  All others reviewed and negative.  Objective        PE:  BP 120/66   Pulse 67   Ht 5\' 11"  (1.803 m)   Wt 256 lb (116.1 kg)   SpO2 92%   BMI 35.70 kg/m                 Constitutional: Pt appears in NAD               HENT: Head: NCAT.                Right Ear: External ear normal.                 Left Ear: External ear normal.                Eyes: . Pupils are equal, round, and reactive to light. Conjunctivae and EOM are normal               Nose: without d/c or deformity               Neck: Neck supple. Gross normal ROM               Cardiovascular: Normal rate and regular rhythm.                 Pulmonary/Chest: Effort normal and breath sounds without rales or wheezing.                Abd:  Soft, NT, ND, + BS, no organomegaly               Neurological: Pt is alert. At baseline orientation, motor grossly intact               Skin: Skin is warm. No rashes, no other new lesions, LE edema - none               Psychiatric: Pt behavior is normal without agitation   Micro: none  Cardiac tracings I have personally interpreted today:  none  Pertinent Radiological findings (summarize): none    Lab Results  Component Value Date   WBC 9.1 08/19/2020   HGB 13.6 08/19/2020   HCT 40.7 08/19/2020   PLT 172.0 08/19/2020   GLUCOSE 244 (H) 08/19/2020   CHOL 97 08/19/2020   TRIG 230.0 (H) 08/19/2020   HDL 26.80 (L) 08/19/2020   LDLDIRECT 52.0 08/19/2020   LDLCALC 23 07/25/2019   ALT 19 08/19/2020   AST 20 08/19/2020   NA 139 08/19/2020   K 4.5 08/19/2020   CL 102 08/19/2020   CREATININE 1.39 08/19/2020   BUN 20 08/19/2020   CO2 31 08/19/2020   TSH 2.26 08/19/2020   PSA 1.71 08/19/2020   INR 0.93 12/29/2013   HGBA1C 7.4 (H) 08/19/2020   MICROALBUR 13.3 (H) 08/19/2020   Assessment/Plan:  Aaron Maldonado is a 75 y.o. White or Caucasian [1] male with  has a past medical history of Abnormal nuclear stress test (12/29/2013), Allergy, Arthritis, Chest pain, CHF (congestive heart failure) ([place]), Chicken pox, Colon polyps, Coronary artery disease, Depression, Diabetes mellitus without complication ([place]), Diverticulitis,  Dyspnea on exertion, GERD (gastroesophageal reflux disease), Heart murmur, History of heart surgery, Hyperlipidemia, Hypertension, and Stroke ([place]).  Encounter for well adult exam with abnormal findings Age and sex appropriate education and counseling updated with regular exercise and diet Referrals for preventative services - none needed Immunizations addressed - none needed Smoking counseling  - none needed Evidence for depression or other mood disorder - none significant Most recent labs reviewed. I have personally reviewed and have noted: 1) the patient's medical and social history 2) The patient's current medications and supplements 3) The patient's height, weight, and BMI have been recorded in the chart   Neuropathic pain Ok for change gabapentin to 600 bid and 1200 qhs  Insomnia Ok for change gabapentin to 600 bid and 1200 qhs - also to help with sleep  Hyperlipidemia Lab Results  Component Value Date   [place] 23 07/25/2019   Stable, pt to  continue current statin crestor 10   Essential hypertension BP Readings from Last 3 Encounters:  08/19/20 120/66  07/25/19 138/70  03/27/19 124/76   Stable, pt to continue medical treatment norvasc, coreg   Diabetes ([place]) Lab Results  Component Value Date   HGBA1C 7.4 (H) 08/19/2020   Stable, pt to continue current medical treatment glucotrol, glargine   Closed fracture of left proximal radius Also for dxa,  to f/u any worsening symptoms or concerns  Followup: Return in about 6 months (around 02/18/2021).  Cathlean Cower, MD 08/19/2020 8:56 PM [facility]

## 2020-08-19 NOTE — Assessment & Plan Note (Signed)
Ok for change gabapentin to 600 bid and 1200 qhs

## 2020-08-19 NOTE — Assessment & Plan Note (Signed)
BP Readings from Last 3 Encounters:  08/19/20 120/66  07/25/19 138/70  03/27/19 124/76   Stable, pt to continue medical treatment norvasc, coreg

## 2020-08-19 NOTE — Assessment & Plan Note (Signed)
Lab Results  Component Value Date   LDLCALC 23 07/25/2019   Stable, pt to continue current statin crestor 10

## 2020-08-19 NOTE — Assessment & Plan Note (Signed)
Ok for change gabapentin to 600 bid and 1200 qhs - also to help with sleep

## 2020-08-19 NOTE — Assessment & Plan Note (Signed)
Also for dxa,  to f/u any worsening symptoms or concerns

## 2020-08-19 NOTE — Assessment & Plan Note (Signed)
Lab Results  Component Value Date   HGBA1C 7.4 (H) 08/19/2020   Stable, pt to continue current medical treatment glucotrol, glargine

## 2020-08-19 NOTE — Assessment & Plan Note (Signed)

## 2020-08-20 ENCOUNTER — Telehealth: Payer: Self-pay | Admitting: Cardiovascular Disease

## 2020-08-20 NOTE — Telephone Encounter (Signed)
STAT if patient feels like he/she is going to faint   1) Are you dizzy now? no  2) Do you feel faint or have you passed out? no  3) Do you have any other symptoms? Lightheaded, gives out easily and sluggish  4) Have you checked your HR and BP (record if available)? No   Patient's daughter called in because patient has been "giving out easily". She states he will try to work in the yard and then after about 10-22mins he is already sluggish. Patient was in the background and denied SOB and chest pain. States he does sometimes get dizzy but never feels like he is going to pass out. Scheduled him for 05/03 with Coletta Memos since his 3 years was almost up with Dr. Gwenlyn Found. Please contact patient back.

## 2020-08-20 NOTE — Telephone Encounter (Signed)
Called patient back, he states that he has just noticed he is giving out easily, he will become fatigued after doing something for 10-15 minutes. He states he is not having any other symptoms, just seen PCP yesterday- they did full panel of blood work, everything checked out. His blood pressure this AM was 162/59, and HR 60. Patient was advised to keep a BP/HR log, notify us of any shortness of breath, swelling or chest pain and to make sure he is staying hydrated while working outdoors.  Last heart testing was done in 2015.  Will notify Coletta Memos, NP who will be seeing the patient on 05/03 to make him aware. Patient was given call back precautions, and ED precautions.   Patient verbalized understanding.

## 2020-08-31 ENCOUNTER — Other Ambulatory Visit: Payer: Medicare Other

## 2021-01-20 ENCOUNTER — Ambulatory Visit (INDEPENDENT_AMBULATORY_CARE_PROVIDER_SITE_OTHER): Payer: Medicare Other

## 2021-01-20 DIAGNOSIS — Z0000 Encounter for general adult medical examination without abnormal findings: Secondary | ICD-10-CM | POA: Diagnosis not present

## 2021-01-20 NOTE — Patient Instructions (Signed)
Aaron Maldonado , Thank you for taking time to come for your Medicare Wellness Visit. I appreciate your ongoing commitment to your health goals. Please review the following plan we discussed and let me know if I can assist you in the future.   Screening recommendations/referrals: Colonoscopy: 11/12/2018; due every 7 years Recommended yearly ophthalmology/optometry visit for glaucoma screening and checkup Recommended yearly dental visit for hygiene and checkup  Vaccinations: Influenza vaccine: 04/08/2020 Pneumococcal vaccine: 03/23/2016, 09/21/2017 Tdap vaccine: 03/23/2017; due every 10 years Shingles vaccine: 07/27/2020; need second dose of Shingrix   Covid-19: 07/22/2019, 08/20/2019, 04/25/2020  Advanced directives: Advance directive discussed with you today. Even though you declined this today please call our office should you change your mind and we can give you the proper paperwork for you to fill out.  Conditions/risks identified: Yes; Client understands the importance of follow-up with providers by attending scheduled visits and discussed goals to eat healthier, increase physical activity, exercise the brain, socialize more, get enough sleep and make time for laughter.  Next appointment: Please schedule your next Medicare Wellness Visit with your Nurse Health Advisor in 1 year by calling [phone].  Preventive Care 76 Years and Older, Male Preventive care refers to lifestyle choices and visits with your health care provider that can promote health and wellness. What does preventive care include? A yearly physical exam. This is also called an annual well check. Dental exams once or twice a year. Routine eye exams. Ask your health care provider how often you should have your eyes checked. Personal lifestyle choices, including: Daily care of your teeth and gums. Regular physical activity. Eating a healthy diet. Avoiding tobacco and drug use. Limiting alcohol use. Practicing safe sex. Taking  low doses of aspirin every day. Taking vitamin and mineral supplements as recommended by your health care provider. What happens during an annual well check? The services and screenings done by your health care provider during your annual well check will depend on your age, overall health, lifestyle risk factors, and family history of disease. Counseling  Your health care provider may ask you questions about your: Alcohol use. Tobacco use. Drug use. Emotional well-being. Home and relationship well-being. Sexual activity. Eating habits. History of falls. Memory and ability to understand (cognition). Work and work Statistician. Screening  You may have the following tests or measurements: Height, weight, and BMI. Blood pressure. Lipid and cholesterol levels. These may be checked every 5 years, or more frequently if you are over 29 years old. Skin check. Lung cancer screening. You may have this screening every year starting at age 94 if you have a 30-pack-year history of smoking and currently smoke or have quit within the past 15 years. Fecal occult blood test (FOBT) of the stool. You may have this test every year starting at age 22. Flexible sigmoidoscopy or colonoscopy. You may have a sigmoidoscopy every 5 years or a colonoscopy every 10 years starting at age 64. Prostate cancer screening. Recommendations will vary depending on your family history and other risks. Hepatitis C blood test. Hepatitis B blood test. Sexually transmitted disease (STD) testing. Diabetes screening. This is done by checking your blood sugar (glucose) after you have not eaten for a while (fasting). You may have this done every 1-3 years. Abdominal aortic aneurysm (AAA) screening. You may need this if you are a current or former smoker. Osteoporosis. You may be screened starting at age 54 if you are at high risk. Talk with your health care provider about your test results,  treatment options, and if necessary, the  need for more tests. Vaccines  Your health care provider may recommend certain vaccines, such as: Influenza vaccine. This is recommended every year. Tetanus, diphtheria, and acellular pertussis (Tdap, Td) vaccine. You may need a Td booster every 10 years. Zoster vaccine. You may need this after age 86. Pneumococcal 13-valent conjugate (PCV13) vaccine. One dose is recommended after age 73. Pneumococcal polysaccharide (PPSV23) vaccine. One dose is recommended after age 49. Talk to your health care provider about which screenings and vaccines you need and how often you need them. This information is not intended to replace advice given to you by your health care provider. Make sure you discuss any questions you have with your health care provider. Document Released: 06/04/2015 Document Revised: 01/26/2016 Document Reviewed: 03/09/2015 Elsevier Interactive Patient Education  2017 [place] Prevention in the Home Falls can cause injuries. They can happen to people of all ages. There are many things you can do to make your home safe and to help prevent falls. What can I do on the outside of my home? Regularly fix the edges of walkways and driveways and fix any cracks. Remove anything that might make you trip as you walk through a door, such as a raised step or threshold. Trim any bushes or trees on the path to your home. Use bright outdoor lighting. Clear any walking paths of anything that might make someone trip, such as rocks or tools. Regularly check to see if handrails are loose or broken. Make sure that both sides of any steps have handrails. Any raised decks and porches should have guardrails on the edges. Have any leaves, snow, or ice cleared regularly. Use sand or salt on walking paths during winter. Clean up any spills in your garage right away. This includes oil or grease spills. What can I do in the bathroom? Use night lights. Install grab bars by the toilet and in the tub  and shower. Do not use towel bars as grab bars. Use non-skid mats or decals in the tub or shower. If you need to sit down in the shower, use a plastic, non-slip stool. Keep the floor dry. Clean up any water that spills on the floor as soon as it happens. Remove soap buildup in the tub or shower regularly. Attach bath mats securely with double-sided non-slip rug tape. Do not have throw rugs and other things on the floor that can make you trip. What can I do in the bedroom? Use night lights. Make sure that you have a light by your bed that is easy to reach. Do not use any sheets or blankets that are too big for your bed. They should not hang down onto the floor. Have a firm chair that has side arms. You can use this for support while you get dressed. Do not have throw rugs and other things on the floor that can make you trip. What can I do in the kitchen? Clean up any spills right away. Avoid walking on wet floors. Keep items that you use a lot in easy-to-reach places. If you need to reach something above you, use a strong step stool that has a grab bar. Keep electrical cords out of the way. Do not use floor polish or wax that makes floors slippery. If you must use wax, use non-skid floor wax. Do not have throw rugs and other things on the floor that can make you trip. What can I do with my stairs? Do  not leave any items on the stairs. Make sure that there are handrails on both sides of the stairs and use them. Fix handrails that are broken or loose. Make sure that handrails are as long as the stairways. Check any carpeting to make sure that it is firmly attached to the stairs. Fix any carpet that is loose or worn. Avoid having throw rugs at the top or bottom of the stairs. If you do have throw rugs, attach them to the floor with carpet tape. Make sure that you have a light switch at the top of the stairs and the bottom of the stairs. If you do not have them, ask someone to add them for  you. What else can I do to help prevent falls? Wear shoes that: Do not have high heels. Have rubber bottoms. Are comfortable and fit you well. Are closed at the toe. Do not wear sandals. If you use a stepladder: Make sure that it is fully opened. Do not climb a closed stepladder. Make sure that both sides of the stepladder are locked into place. Ask someone to hold it for you, if possible. Clearly mark and make sure that you can see: Any grab bars or handrails. First and last steps. Where the edge of each step is. Use tools that help you move around (mobility aids) if they are needed. These include: Canes. Walkers. Scooters. Crutches. Turn on the lights when you go into a dark area. Replace any light bulbs as soon as they burn out. Set up your furniture so you have a clear path. Avoid moving your furniture around. If any of your floors are uneven, fix them. If there are any pets around you, be aware of where they are. Review your medicines with your doctor. Some medicines can make you feel dizzy. This can increase your chance of falling. Ask your doctor what other things that you can do to help prevent falls. This information is not intended to replace advice given to you by your health care provider. Make sure you discuss any questions you have with your health care provider. Document Released: 03/04/2009 Document Revised: 10/14/2015 Document Reviewed: 06/12/2014 Elsevier Interactive Patient Education  2017 [employer].

## 2021-01-20 NOTE — Progress Notes (Signed)
I connected with Aaron Maldonado today by telephone and verified that I am speaking with the correct person using two identifiers. Location patient: home Location provider: work Persons participating in the virtual visit: patient, provider.   I discussed the limitations, risks, security and privacy concerns of performing an evaluation and management service by telephone and the availability of in person appointments. I also discussed with the patient that there may be a patient responsible charge related to this service. The patient expressed understanding and verbally consented to this telephonic visit.    Interactive audio and video telecommunications were attempted between this provider and patient, however failed, due to patient having technical difficulties OR patient did not have access to video capability.  We continued and completed visit with audio only.  Some vital signs may be absent or patient reported.   Time Spent with patient on telephone encounter: 30 minutes  Subjective:   Aaron Maldonado is a 75 y.o. male who presents for Medicare Annual/Subsequent preventive examination.  Review of Systems     Cardiac Risk Factors include: advanced age (>17mn, >>61women);dyslipidemia;hypertension;male gender;family history of premature cardiovascular disease;diabetes mellitus     Objective:    There were no vitals filed for this visit. There is no height or weight on file to calculate BMI.  Advanced Directives 01/20/2021 11/12/2018 04/20/2017 11/01/2016 12/30/2015 06/04/2015 03/26/2015  Does Patient Have a Medical Advance Directive? No No No No No No No  Would patient like information on creating a medical advance directive? No - Patient declined No - Patient declined No - Patient declined No - Patient declined - - No - patient declined information    Current Medications (verified) Outpatient Encounter Medications as of 01/20/2021  Medication Sig   acetaminophen (TYLENOL) 500 MG  tablet TAKE ONE TABLET BY MOUTH THREE TIMES A DAY AS NEEDED FOR MILD TO MODERATE PAIN, FEVER, BODY ACHES, HEADACHE   Alcohol Swabs (ALCOHOL WIPES) 70 % PADS USE FOR INSULIN INJECTIONS OR TO FINGER STICK AS DIRECTED   allopurinol (ZYLOPRIM) 300 MG tablet Take 1 tablet (300 mg total) by mouth daily.   amLODipine (NORVASC) 5 MG tablet Take 1 tablet (5 mg total) by mouth daily.   aspirin 81 MG tablet Take 81 mg by mouth daily.   calcium carbonate (OS-CAL) 600 MG TABS tablet Take 600 mg by mouth daily.   carvedilol (COREG) 3.125 MG tablet Take 1 tablet (3.125 mg total) by mouth daily.   cetirizine (ZYRTEC) 10 MG tablet Take 10 mg by mouth daily.   Continuous Blood Gluc Receiver (FREESTYLE LIBRE 14 DAY READER) DEVI 1 Device by Does not apply route 4 (four) times daily. E11.9   Continuous Blood Gluc Sensor (FREESTYLE LIBRE SENSOR SYSTEM) MISC 1 Device by Does not apply route every 14 (fourteen) days. E11.9   furosemide (LASIX) 40 MG tablet Take 1 tablet (40 mg total) by mouth daily as needed for edema.   gabapentin (NEURONTIN) 600 MG tablet 1 tab by mouth twice per day and 2 tabs at bedtime   glipiZIDE (GLUCOTROL XL) 10 MG 24 hr tablet TAKE 1 TABLET BY MOUTH ONCE DAILY WITH BREAKFAST   HYDROcodone-acetaminophen (NORCO/VICODIN) 5-325 MG tablet Take 1 tablet by mouth 2 (two) times daily as needed for moderate pain.   insulin glargine (LANTUS SOLOSTAR) 100 UNIT/ML Solostar Pen Inject 55 Units into the skin daily at 10 pm.   Insulin Pen Needle (BD PEN NEEDLE NANO U/F) 32G X 4 MM MISC 1 with pen use daily  metFORMIN (GLUCOPHAGE) 1000 MG tablet Take 1 tablet (1,000 mg total) by mouth 2 (two) times daily with a meal.   nitroGLYCERIN (NITROSTAT) 0.4 MG SL tablet Place 1 tablet (0.4 mg total) under the tongue every 5 (five) minutes as needed for chest pain.   Omega-3 Fatty Acids (FISH OIL) 1000 MG CAPS TAKE TWO CAPSULES BY MOUTH DAILY ELEVATED TRIGLYCERIDES   omeprazole (PRILOSEC) 20 MG capsule Take 1 capsule  (20 mg total) by mouth 2 (two) times daily before a meal.   PARoxetine (PAXIL) 40 MG tablet Take 0.5 tablets by mouth daily.   potassium chloride (KLOR-CON) 8 MEQ tablet Take 1 tablet (8 mEq total) by mouth daily.   rOPINIRole (REQUIP) 3 MG tablet Take 3 tablets (9 mg total) by mouth at bedtime.   rosuvastatin (CRESTOR) 10 MG tablet Take 1 tablet (10 mg total) by mouth daily.   tamsulosin (FLOMAX) 0.4 MG CAPS capsule TAKE ONE CAPSULE BY MOUTH ONCE A DAY FOR URINATION AND PROSTATE   No facility-administered encounter medications on file as of 01/20/2021.    Allergies (verified) Keflex [cephalexin], Lisinopril, Penicillins, Sulfa antibiotics, and Sulfasalazine   History: Past Medical History:  Diagnosis Date   Abnormal nuclear stress test 12/29/2013   Allergy    Arthritis    Chest pain    CHF (congestive heart failure) (HCC)    Chicken pox    Colon polyps    Coronary artery disease    Depression    Diabetes mellitus without complication (HCC)    Diverticulitis    Dyspnea on exertion    GERD (gastroesophageal reflux disease)    Heart murmur    History of heart surgery    Hyperlipidemia    Hypertension    Stroke [facility])    Past Surgical History:  Procedure Laterality Date   COLONOSCOPY     KNEE SURGERY     LEFT HEART CATHETERIZATION WITH CORONARY ANGIOGRAM N/A 01/06/2014   Procedure: LEFT HEART CATHETERIZATION WITH CORONARY ANGIOGRAM;  Surgeon: Lorretta Harp, MD;  Location[facility] C[facility] LAB;  Service: Cardiovascular;  Laterality: N/A;   SHOULDER SURGERY     TONSILLECTOMY     Family History  Problem Relation Age of Onset   Heart failure Mother    Heart disease Mother    Mitral valve prolapse Mother        rheumatic fever, MVReplacement   Alzheimer's disease Father    Breast cancer Sister        and sacrum cancer   SIDS Daughter    Hyperlipidemia Maternal Grandmother    Miscarriages / Stillbirths Maternal Grandmother    Hypertension Maternal Grandmother    Hyperlipidemia  Maternal Grandfather    Miscarriages / Stillbirths Maternal Grandfather    Hypertension Maternal Grandfather    Hypertension Paternal Grandmother    Hypertension Paternal Grandfather    Colon cancer Neg Hx    Esophageal cancer Neg Hx    Stomach cancer Neg Hx    Rectal cancer Neg Hx    Social History   Socioeconomic History   Marital status: Divorced    Spouse name: Not on file   Number of children: 3   Years of education: 9   Highest education level: Not on file  Occupational History   Occupation: retired    Comment: former Administrator  Tobacco Use   Smoking status: Former    Packs/day: 1.50    Years: 43.00    Pack years: 64.50    Types: Cigarettes  Quit date: 05/22/1988    Years since quitting: 32.6   Smokeless tobacco: Never  Vaping Use   Vaping Use: Never used  Substance and Sexual Activity   Alcohol use: No   Drug use: No   Sexual activity: Not on file  Other Topics Concern   Not on file  Social History Narrative   Fun/Hobby: Work    Denies abuse and feels safe at home.    Social Determinants of Health   Financial Resource Strain: Low Risk    Difficulty of Paying Living Expenses: Not hard at all  Food Insecurity: No Food Insecurity   Worried About Charity fundraiser in the Last Year: Never true   [place] in the Last Year: Never true  Transportation Needs: No Transportation Needs   Lack of Transportation (Medical): No   Lack of Transportation (Non-Medical): No  Physical Activity: Inactive   Days of Exercise per Week: 0 days   Minutes of Exercise per Session: 0 min  Stress: No Stress Concern Present   Feeling of Stress : Not at all  Social Connections: Moderately Isolated   Frequency of Communication with Friends and Family: More than three times a week   Frequency of Social Gatherings with Friends and Family: Once a week   Attends Religious Services: Never   Marine scientist or Organizations: No   Attends Music therapist:  Never   Marital Status: Married    Tobacco Counseling Counseling given: Not Answered   Clinical Intake:  Pre-visit preparation completed: Yes  Pain : No/denies pain     Nutritional Risks: None Diabetes: Yes CBG done?: No Did pt. bring in CBG monitor from home?: No  How often do you need to have someone help you when you read instructions, pamphlets, or other written materials from your doctor or pharmacy?: 1 - Never What is the last grade level you completed in school?: 9th grade  Diabetic? yes  Interpreter Needed?: No  Information entered by :: Lisette Abu, LPN   Activities of Daily Living In your present state of health, do you have any difficulty performing the following activities: 01/20/2021 08/19/2020  Hearing? N N  Vision? N N  Difficulty concentrating or making decisions? Y N  Walking or climbing stairs? N N  Dressing or bathing? N N  Doing errands, shopping? N N  Preparing Food and eating ? N -  Using the Toilet? N -  In the past six months, have you accidently leaked urine? N -  Do you have problems with loss of bowel control? N -  Managing your Medications? N -  Managing your Finances? N -  Housekeeping or managing your Housekeeping? N -  Some recent data might be hidden    Patient Care Team: Biagio Borg, MD as PCP - General (Internal Medicine)  Indicate any recent Medical Services you may have received from other than [facility] providers in the past year (date may be approximate).     Assessment:   This is a routine wellness examination for Aaron Maldonado.  Hearing/Vision screen Hearing Screening - Comments:: Patient denied any hearing difficulty. Vision Screening - Comments:: Patient wears eyeglasses.  [facility] exam done at [facility].  Dietary issues and exercise activities discussed: Current Exercise Habits: The patient does not participate in regular exercise at present, Exercise limited by: None identified   Goals Addressed   None    Depression Screen [facility] 2/9 Scores 01/20/2021 08/19/2020 08/19/2020 07/25/2019 09/23/2018 09/21/2017 03/23/2017  PHQ - 2 Score 0 0 0 0 0 0 0    Fall Risk Fall Risk  01/20/2021 08/19/2020 08/19/2020 07/25/2019 09/23/2018  Falls in the past year? 1 0 1 0 0  Number falls in past yr: 0 - 0 - -  Injury with Fall? 1 - 1 - -    FALL RISK PREVENTION PERTAINING TO THE HOME:  Any stairs in or around the home? Yes  If so, are there any without handrails? No  Home free of loose throw rugs in walkways, pet beds, electrical cords, etc? Yes  Adequate lighting in your home to reduce risk of falls? Yes   ASSISTIVE DEVICES UTILIZED TO PREVENT FALLS:  Life alert? No  Use of a cane, walker or w/c? No  Grab bars in the bathroom? Yes  Shower chair or bench in shower? Yes  Elevated toilet seat or a handicapped toilet? No   TIMED UP AND GO:  Was the test performed? No .  Length of time to ambulate 10 feet: n/a sec.   Gait steady and fast without use of assistive device  Cognitive Function: No flowsheet data found.         Immunizations Immunization History  Administered Date(s) Administered   Fluad Quad(high Dose 65+) 02/01/2019   Influenza-Unspecified 03/13/2017, 01/20/2018   Moderna Sars-Covid-2 Vaccination 07/22/2019, 08/20/2019, 05/05/2020   Pneumococcal Conjugate-13 03/23/2016   Pneumococcal Polysaccharide-23 09/21/2017   Tdap 03/23/2017   Zoster Recombinat (Shingrix) 07/27/2020    TDAP status: Up to date  Flu Vaccine status: Up to date  Pneumococcal vaccine status: Up to date  Covid-19 vaccine status: Completed vaccines  Qualifies for Shingles Vaccine? Yes   Zostavax completed No   Shingrix Completed?: No.    Education has been provided regarding the importance of this vaccine. Patient has been advised to call insurance company to determine out of pocket expense if they have not yet received this vaccine. Advised may also receive vaccine at local pharmacy or Health Dept. Verbalized acceptance  and understanding.  Screening Tests Health Maintenance  Topic Date Due   COVID-19 Vaccine (4 - Booster for Moderna series) 09/03/2020   Zoster Vaccines- Shingrix (2 of 2) 09/21/2020   INFLUENZA VACCINE  12/20/2020   HEMOGLOBIN A1C  02/18/2021   OPHTHALMOLOGY EXAM  06/22/2021   FOOT EXAM  08/19/2021   URINE MICROALBUMIN  08/19/2021   COLONOSCOPY (Pts 45-36yr Insurance coverage will need to be confirmed)  11/11/2025   TETANUS/TDAP  03/24/2027   Hepatitis C Screening  Completed   PNA vac Low Risk Adult  Completed   HPV VACCINES  Aged Out    Health Maintenance  Health Maintenance Due  Topic Date Due   COVID-19 Vaccine (4 - Booster for Moderna series) 09/03/2020   Zoster Vaccines- Shingrix (2 of 2) 09/21/2020   INFLUENZA VACCINE  12/20/2020    Colorectal cancer screening: Type of screening: Colonoscopy. Completed 11/12/2018. Repeat every 7 years  Lung Cancer Screening: (Low Dose CT Chest recommended if Age 76-80years, 30 pack-year currently smoking OR have quit w/in 15years.) does not qualify.   Lung Cancer Screening Referral: no  Additional Screening:  Hepatitis C Screening: does qualify; Completed yes  Vision Screening: Recommended annual ophthalmology exams for early detection of glaucoma and other disorders of the eye. Is the patient up to date with their annual eye exam?  Yes  Who is the provider or what is the name of the office in which the patient [facility]ends [facility] If pt  is not established with a provider, would they like to be referred to a provider to establish care? No .   Dental Screening: Recommended annual dental exams for proper oral hygiene  Community Resource Referral / Chronic Care Management: CRR required this visit?  No   CCM required this visit?  No      Plan:     I have personally reviewed and noted the following in the patient's chart:   Medical and social history Use of alcohol, tobacco or illicit drugs  Current  medications and supplements including opioid prescriptions. Patient is not currently taking opioid prescriptions. Functional ability and status Nutritional status Physical activity Advanced directives List of other physicians Hospitalizations, surgeries, and ER visits in previous 12 months Vitals Screenings to include cognitive, depression, and falls Referrals and appointments  In addition, I have reviewed and discussed with patient certain preventive protocols, quality metrics, and best practice recommendations. A written personalized care plan for preventive services as well as general preventive health recommendations were provided to patient.     Sheral Flow, LPN   [id number]   Nurse Notes:  Patient is cogitatively intact. There were no vitals filed for this visit. There is no height or weight on file to calculate BMI. Hearing Screening - Comments:: Patient denied any hearing difficulty. Vision Screening - Comments:: Patient wears eyeglasses.  [facility] exam [facility] at [facility].

## 2021-02-08 ENCOUNTER — Telehealth: Payer: Self-pay | Admitting: Cardiovascular Disease

## 2021-02-08 NOTE — Telephone Encounter (Signed)
Spoke with patient's daughter who reports patient has had shortness of breath for several months. He is "give out" when he walks to mailbox or after showers. She reports he does not do much activity, watches TV, sits, sleeps. She reports he can fall asleep easily. He does not eat well. This is not an acute change in his behavior. Patient denies chest pain, but has MSK back pain.   Patient has not seen Dr. Gwenlyn Found since 2019

## 2021-02-08 NOTE — Telephone Encounter (Signed)
Pt c/o Shortness Of Breath: STAT if SOB developed within the last 24 hours or pt is noticeably SOB on the phone  1. Are you currently SOB (can you hear that pt is SOB on the phone)?  No, patient's daughter states the patient is asleep, so he is not currently SOB  2. How long have you been experiencing SOB?  Several months, per patient's daughter   3. Are you SOB when sitting or when up moving around?  When up and moving around   4. Are you currently experiencing any other symptoms?  No

## 2021-02-17 ENCOUNTER — Encounter: Payer: Self-pay | Admitting: Internal Medicine

## 2021-02-17 ENCOUNTER — Ambulatory Visit (INDEPENDENT_AMBULATORY_CARE_PROVIDER_SITE_OTHER): Payer: Medicare Other | Admitting: Internal Medicine

## 2021-02-17 ENCOUNTER — Other Ambulatory Visit: Payer: Self-pay

## 2021-02-17 VITALS — BP 156/66 | HR 58 | Temp 98.3°F | Ht 71.0 in | Wt 267.0 lb

## 2021-02-17 DIAGNOSIS — I10 Essential (primary) hypertension: Secondary | ICD-10-CM | POA: Diagnosis not present

## 2021-02-17 DIAGNOSIS — E538 Deficiency of other specified B group vitamins: Secondary | ICD-10-CM

## 2021-02-17 DIAGNOSIS — Z23 Encounter for immunization: Secondary | ICD-10-CM

## 2021-02-17 DIAGNOSIS — E559 Vitamin D deficiency, unspecified: Secondary | ICD-10-CM

## 2021-02-17 DIAGNOSIS — E782 Mixed hyperlipidemia: Secondary | ICD-10-CM | POA: Diagnosis not present

## 2021-02-17 DIAGNOSIS — E1142 Type 2 diabetes mellitus with diabetic polyneuropathy: Secondary | ICD-10-CM

## 2021-02-17 LAB — BASIC METABOLIC PANEL
BUN: 20 mg/dL (ref 6–23)
CO2: 33 mEq/L — ABNORMAL HIGH (ref 19–32)
Calcium: 10.2 mg/dL (ref 8.4–10.5)
Chloride: 101 mEq/L (ref 96–112)
Creatinine, Ser: 1.26 mg/dL (ref 0.40–1.50)
GFR: 55.62 mL/min — ABNORMAL LOW (ref >60.00)
Glucose, Bld: 176 mg/dL — ABNORMAL HIGH (ref 70–99)
Potassium: 4.5 mEq/L (ref 3.5–5.1)
Sodium: 139 mEq/L (ref 135–145)

## 2021-02-17 LAB — HEPATIC FUNCTION PANEL
ALT: 17 U/L (ref 0–53)
AST: 18 U/L (ref 0–37)
Albumin: 4.1 g/dL (ref 3.5–5.2)
Alkaline Phosphatase: 71 U/L (ref 39–117)
Bilirubin, Direct: 0.2 mg/dL (ref 0.0–0.3)
Total Bilirubin: 1.5 mg/dL — ABNORMAL HIGH (ref 0.2–1.2)
Total Protein: 7 g/dL (ref 6.0–8.3)

## 2021-02-17 LAB — LIPID PANEL
Cholesterol: 106 mg/dL (ref 0–200)
HDL: 32 mg/dL — ABNORMAL LOW (ref >39.00)
LDL Cholesterol: 40 mg/dL (ref 0–99)
NonHDL: 74.38
Total CHOL/HDL Ratio: 3
Triglycerides: 174 mg/dL — ABNORMAL HIGH (ref 0.0–149.0)
VLDL: 34.8 mg/dL (ref 0.0–40.0)

## 2021-02-17 LAB — HEMOGLOBIN A1C: Hgb A1c MFr Bld: 7.9 % — ABNORMAL HIGH (ref 4.6–6.5)

## 2021-02-17 LAB — VITAMIN D 25 HYDROXY (VIT D DEFICIENCY, FRACTURES): VITD: 39.3 ng/mL (ref 30.00–100.00)

## 2021-02-17 LAB — VITAMIN B12: Vitamin B-12: 254 pg/mL (ref 211–911)

## 2021-02-17 NOTE — Assessment & Plan Note (Addendum)
Lab Results  Component Value Date   HGBA1C 7.9 (H) 02/17/2021   Mild uncontrolled, and pt wants to d/c glucotrol for simplicity/less pills,, pt to o/w continue current medical treatment , consider increased lantus

## 2021-02-17 NOTE — Assessment & Plan Note (Signed)
Last vitamin D Lab Results  Component Value Date   VD25OH 39.30 02/17/2021   Low, to start oral replacement

## 2021-02-17 NOTE — Assessment & Plan Note (Addendum)
BP Readings from Last 3 Encounters:  02/17/21 (!) 156/66  08/19/20 120/66  07/25/19 138/70   Uncontrolled here, pt does not want med change, pt to continue medical treatment norvasc, coreg, and to continue to monitor BP at home and next visit

## 2021-02-17 NOTE — Progress Notes (Signed)
Patient ID: Aaron Maldonado, male   DOB: [birth year], 75 y.o.   MRN: [id number]        Chief Complaint: follow up HTN, HLD and hyperglycemia, fatigue, low b12, low Vit D       HPI:  Aaron Maldonado is a 75 y.o. male here overall doing ok,  Pt denies chest pain, increased sob or doe, wheezing, orthopnea, PND, increased LE swelling, palpitations, dizziness or syncope.   Pt denies polydipsia, polyuria, or new focal neuro s/s.   Pt denies fever, wt loss, night sweats, loss of appetite, or other constitutional symptoms Gained 8 lbs in 6 mo with less active, mor TV watcing,  Denies worsening depressive symptoms, suicidal ideation, or panic; Wife not here but concerned he maybe more forgetful recently.  He states he simply has nothing to do, so just does nothing most days, and wife still works.  Not taking Vit D or B12.   Wt Readings from Last 3 Encounters:  02/17/21 267 lb (121.1 kg)  08/19/20 256 lb (116.1 kg)  07/25/19 259 lb 9.6 oz (117.8 kg)   BP Readings from Last 3 Encounters:  02/17/21 (!) 156/66  08/19/20 120/66  07/25/19 138/70         Past Medical History:  Diagnosis Date   Abnormal nuclear stress test 12/29/2013   Allergy    Arthritis    Chest pain    CHF (congestive heart failure) (HCC)    Chicken pox    Colon polyps    Coronary artery disease    Depression    Diabetes mellitus without complication (HCC)    Diverticulitis    Dyspnea on exertion    GERD (gastroesophageal reflux disease)    Heart murmur    History of heart surgery    Hyperlipidemia    Hypertension    Stroke [facility])    Past Surgical History:  Procedure Laterality Date   COLONOSCOPY     KNEE SURGERY     LEFT HEART CATHETERIZATION WITH CORONARY ANGIOGRAM N/A 01/06/2014   Procedure: LEFT HEART CATHETERIZATION WITH CORONARY ANGIOGRAM;  Surgeon: Lorretta Harp, MD;  Location: [facility] CATH LAB;  Service: Cardiovascular;  Laterality: N/A;   SHOULDER SURGERY     TONSILLECTOMY      reports that he quit smoking  about 32 years ago. His smoking use included cigarettes. He has a 64.50 pack-year smoking history. He has never used smokeless tobacco. He reports that he does not drink alcohol and does not use drugs. family history includes Alzheimer's disease in his father; Breast cancer in his sister; Heart disease in his mother; Heart failure in his mother; Hyperlipidemia in his maternal grandfather and maternal grandmother; Hypertension in his maternal grandfather, maternal grandmother, paternal grandfather, and paternal grandmother; 48 / [place] in his maternal grandfather and maternal grandmother; Mitral valve prolapse in his mother; SIDS in his daughter. Allergies  Allergen Reactions   Losartan     Other reaction(s): Angioedema (ALLERGY/intolerance)   Atorvastatin     Other reaction(s): Muscle pain   Keflex [Cephalexin]     unknown   Lisinopril Cough   Penicillins Rash    Has patient had a PCN reaction causing immediate rash, facial/tongue/throat swelling, SOB or lightheadedness with hypotension: No Has patient had a PCN reaction causing severe rash involving mucus membranes or skin necrosis: No Has patient had a PCN reaction that required hospitalization: No Has patient had a PCN reaction occurring within the last 10 years: No If all of the above answers  are "NO", then may proceed with Cephalosporin use.    Sulfa Antibiotics Rash    rash   Sulfasalazine Rash    rash   Current Outpatient Medications on File Prior to Visit  Medication Sig Dispense Refill   acetaminophen (TYLENOL) 500 MG tablet TAKE ONE TABLET BY MOUTH THREE TIMES A DAY AS NEEDED FOR MILD TO MODERATE PAIN, FEVER, BODY ACHES, HEADACHE     Alcohol Swabs (ALCOHOL WIPES) 70 % PADS USE FOR INSULIN INJECTIONS OR TO FINGER STICK AS DIRECTED     allopurinol (ZYLOPRIM) 300 MG tablet Take 1 tablet (300 mg total) by mouth daily. 90 tablet 3   amLODipine (NORVASC) 5 MG tablet Take 1 tablet (5 mg total) by mouth daily. 90 tablet 3    aspirin 81 MG tablet Take 81 mg by mouth daily.     calcium carbonate (OS-CAL) 600 MG TABS tablet Take 600 mg by mouth daily.     carvedilol (COREG) 3.125 MG tablet Take 1 tablet (3.125 mg total) by mouth daily. 90 tablet 4   cetirizine (ZYRTEC) 10 MG tablet Take 10 mg by mouth daily.     Continuous Blood Gluc Receiver (FREESTYLE LIBRE 14 DAY READER) DEVI 1 Device by Does not apply route 4 (four) times daily. E11.9 1 Device 0   Continuous Blood Gluc Sensor (FREESTYLE LIBRE SENSOR SYSTEM) MISC 1 Device by Does not apply route every 14 (fourteen) days. E11.9 6 each 3   furosemide (LASIX) 40 MG tablet Take 1 tablet (40 mg total) by mouth daily as needed for edema. 90 tablet 3   gabapentin (NEURONTIN) 600 MG tablet 1 tab by mouth twice per day and 2 tabs at bedtime 360 tablet 1   glipiZIDE (GLUCOTROL XL) 10 MG 24 hr tablet TAKE 1 TABLET BY MOUTH ONCE DAILY WITH BREAKFAST 90 tablet 3   HYDROcodone-acetaminophen (NORCO/VICODIN) 5-325 MG tablet Take 1 tablet by mouth 2 (two) times daily as needed for moderate pain. 14 tablet 0   insulin glargine (LANTUS SOLOSTAR) 100 UNIT/ML Solostar Pen Inject 55 Units into the skin daily at 10 pm. 15 mL 5   Insulin Pen Needle (BD PEN NEEDLE NANO U/F) 32G X 4 MM MISC 1 with pen use daily 100 each 3   metFORMIN (GLUCOPHAGE) 1000 MG tablet Take 1 tablet (1,000 mg total) by mouth 2 (two) times daily with a meal. 180 tablet 3   nitroGLYCERIN (NITROSTAT) 0.4 MG SL tablet Place 1 tablet (0.4 mg total) under the tongue every 5 (five) minutes as needed for chest pain. 90 tablet 3   Omega-3 Fatty Acids (FISH OIL) 1000 MG CAPS TAKE TWO CAPSULES BY MOUTH DAILY ELEVATED TRIGLYCERIDES     omeprazole (PRILOSEC) 20 MG capsule Take 1 capsule (20 mg total) by mouth 2 (two) times daily before a meal. 180 capsule 3   PARoxetine (PAXIL) 40 MG tablet Take 0.5 tablets by mouth daily.     potassium chloride (KLOR-CON) 8 MEQ tablet Take 1 tablet (8 mEq total) by mouth daily. 90 tablet 3    rOPINIRole (REQUIP) 3 MG tablet Take 3 tablets (9 mg total) by mouth at bedtime. 90 tablet 11   rosuvastatin (CRESTOR) 10 MG tablet Take 1 tablet (10 mg total) by mouth daily. 90 tablet 3   tamsulosin (FLOMAX) 0.4 MG CAPS capsule TAKE ONE CAPSULE BY MOUTH ONCE A DAY FOR URINATION AND PROSTATE     No current facility-administered medications on file prior to visit.        ROS:  All others reviewed and negative.  Objective        PE:  BP (!) 156/66 (BP Location: Left Arm, Patient Position: Sitting, Cuff Size: Large)   Pulse (!) 58   Temp 98.3 F (36.8 C) (Oral)   Ht 5\' 11"  (1.803 m)   Wt 267 lb (121.1 kg)   SpO2 96%   BMI 37.24 kg/m                 Constitutional: Pt appears in NAD               HENT: Head: NCAT.                Right Ear: External ear normal.                 Left Ear: External ear normal.                Eyes: . Pupils are equal, round, and reactive to light. Conjunctivae and EOM are normal               Nose: without d/c or deformity               Neck: Neck supple. Gross normal ROM               Cardiovascular: Normal rate and regular rhythm.                 Pulmonary/Chest: Effort normal and breath sounds without rales or wheezing.                Abd:  Soft, NT, ND, + BS, no organomegaly               Neurological: Pt is alert. At baseline orientation, motor grossly intact               Skin: Skin is warm. No rashes, no other new lesions, LE edema - chronic 1+ bilat               Psychiatric: Pt behavior is normal without agitation   Micro: none  Cardiac tracings I have personally interpreted today:  none  Pertinent Radiological findings (summarize): none   Lab Results  Component Value Date   WBC 9.1 08/19/2020   HGB 13.6 08/19/2020   HCT 40.7 08/19/2020   PLT 172.0 08/19/2020   GLUCOSE 176 (H) 02/17/2021   CHOL 106 02/17/2021   TRIG 174.0 (H) 02/17/2021   HDL 32.00 (L) 02/17/2021   LDLDIRECT 52.0 08/19/2020   LDLCALC 40 02/17/2021   ALT 17  02/17/2021   AST 18 02/17/2021   NA 139 02/17/2021   K 4.5 02/17/2021   CL 101 02/17/2021   CREATININE 1.26 02/17/2021   BUN 20 02/17/2021   CO2 33 (H) 02/17/2021   TSH 2.26 08/19/2020   PSA 1.71 08/19/2020   INR 0.93 12/29/2013   HGBA1C 7.9 (H) 02/17/2021   MICROALBUR 13.3 (H) 08/19/2020   Assessment/Plan:  BRENTYN SEEHAFER is a 75 y.o. White or Caucasian [1] male with  has a past medical history of Abnormal nuclear stress test (12/29/2013), Allergy, Arthritis, Chest pain, CHF (congestive heart failure) ([place]), Chicken pox, Colon polyps, Coronary artery disease, Depression, Diabetes mellitus without complication ([place]), Diverticulitis, Dyspnea on exertion, GERD (gastroesophageal reflux disease), Heart murmur, History of heart surgery, Hyperlipidemia, Hypertension, and Stroke ([place]).  Essential hypertension BP Readings from Last 3 Encounters:  02/17/21 (!) 156/66  08/19/20 120/66  07/25/19 138/70   Uncontrolled  here, pt does not want med change, pt to continue medical treatment norvasc, coreg, and to continue to monitor BP at home and next visit   Hyperlipidemia Lab Results  Component Value Date   LDLCALC 40 02/17/2021   Stable, pt to continue current statin crestor   Diabetes ([place]) Lab Results  Component Value Date   HGBA1C 7.9 (H) 02/17/2021   Mild uncontrolled, and pt wants to d/c glucotrol for simplicity/less pills,, pt to o/w continue current medical treatment , consider increased lantus    Vitamin D deficiency Last vitamin D Lab Results  Component Value Date   VD25OH 39.30 02/17/2021   Low, to start oral replacement   B12 deficiency Lab Results  Component Value Date   VITAMINB12 254 02/17/2021   Stable, cont oral replacement - b12 1000 mcg qd  Followup: Return in about 6 months (around 08/17/2021).  Cathlean Cower, MD 02/17/2021 7:32 PM [facility]

## 2021-02-17 NOTE — Assessment & Plan Note (Signed)
Lab Results  Component Value Date   LDLCALC 40 02/17/2021   Stable, pt to continue current statin crestor

## 2021-02-17 NOTE — Patient Instructions (Signed)
You had the flu shot today  Ok to stop the glucotrol  Please take OTC Vitamin D3 at 2000 units per day, indefinitely.    Please also take OTC B12 1000 mcg per day for at least 6  months  Please continue all other medications as before, and refills have been done if requested.  Please have the pharmacy call with any other refills you may need.  Please continue your efforts at being more active, low cholesterol diet, and weight control.  Please keep your appointments with your specialists as you may have planned  Please go to the LAB at the blood drawing area for the tests to be done  You will be contacted by phone if any changes need to be made immediately.  Otherwise, you will receive a letter about your results with an explanation, but please check with MyChart first.  Please remember to sign up for MyChart if you have not done so, as this will be important to you in the future with finding out test results, communicating by private email, and scheduling acute appointments online when needed.  Please make an Appointment to return in 6 months, or sooner if needed

## 2021-02-17 NOTE — Assessment & Plan Note (Signed)
Lab Results  Component Value Date   VITAMINB12 254 02/17/2021   Stable, cont oral replacement - b12 1000 mcg qd

## 2021-02-19 ENCOUNTER — Encounter: Payer: Self-pay | Admitting: Internal Medicine

## 2021-02-19 ENCOUNTER — Other Ambulatory Visit: Payer: Self-pay | Admitting: Internal Medicine

## 2021-02-19 MED ORDER — LANTUS SOLOSTAR 100 UNIT/ML ~~LOC~~ SOPN: 60.0000 [IU] | PEN_INJECTOR | Freq: Every day | SUBCUTANEOUS | 5 refills | Status: DC

## 2021-02-25 ENCOUNTER — Ambulatory Visit: Payer: Medicare Other | Admitting: Cardiovascular Disease

## 2021-02-25 ENCOUNTER — Encounter: Payer: Self-pay | Admitting: Cardiovascular Disease

## 2021-02-25 ENCOUNTER — Other Ambulatory Visit: Payer: Self-pay

## 2021-02-25 DIAGNOSIS — I10 Essential (primary) hypertension: Secondary | ICD-10-CM

## 2021-02-25 DIAGNOSIS — E782 Mixed hyperlipidemia: Secondary | ICD-10-CM | POA: Diagnosis not present

## 2021-02-25 DIAGNOSIS — I2510 Atherosclerotic heart disease of native coronary artery without angina pectoris: Secondary | ICD-10-CM

## 2021-02-25 NOTE — Assessment & Plan Note (Signed)
History of essential hypertension with blood pressure measured today at 158/77.  He is on amlodipine and carvedilol.

## 2021-02-25 NOTE — Patient Instructions (Signed)

## 2021-02-25 NOTE — Assessment & Plan Note (Signed)
History of hyperlipidemia on statin therapy with lipid profile performed 02/17/2021 revealing total cholesterol 106, LDL 40 and HDL 32.

## 2021-02-25 NOTE — Assessment & Plan Note (Signed)
History of several cardiac catheterizations in the past, 1 by me in 2015 and 1 by Dr. Philbert Riser at [facility] after that that showed no obstructive disease and normal LV function.

## 2021-02-25 NOTE — Progress Notes (Signed)
02/25/2021 Aaron Maldonado   [birth year]  [id number]  Primary Physician Aaron Reichmann Hunt Oris, MD Primary Cardiologist: Aaron Harp MD Aaron Maldonado, [place]  HPI:  Aaron Maldonado is a 75 y.o.  moderately overweight divorced Caucasian male father of 4 living children (one deceased) and great grandfather to 6 grandchildren who I last saw saw for video telemedicine virtual visit 10/09/2018.  He is a retired Building surveyor. His primary care is provided by the [facility] in [place]. He was referred by the ER at [facility] for evaluation of chest pain. His cardiac risk factor profile is remarkable for 60 pack years of tobacco abuse having quit back in 1990. He has treated hypertension, diabetes or hyperlipidemia. There is no family history of heart disease. He's never had a heart attack or stroke. 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 He was complaining of chest pain and shortness of breath when I saw him 2 years ago which led to a Myoview stress test 12/11/13 that is high risk with apical scar and peri-infarct ischemia. His EF likewise was reduced by 2-D echo in the 40-45% range with an elevated apical and anterolateral as well as septal hypokinesia. This led to cardiac catheterization performed 01/06/14 by myself revealing noncritical CAD with normal LV function. In retrospect, he attributes the chest pain to falling in his bathtub several weeks prior to seeing me at that time.    He did have an episode of presyncope and was evaluated in [place] and transferred to [facility] where he had white cardiac catheterization by Dr. Philbert Maldonado revealing minimal CAD and an echo that showed normal LV function. He did have an event monitor that showed runs of nonsustained ventricular tachycardia. His losartan was discontinued and he was begun on amlodipine.   Since I saw him in the office almost 2 years ago he is done  well.  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  He lives with his daughter Aaron Maldonado.  He has a very sedentary lifestyle.  He denies chest pain.  Current Meds  Medication Sig   acetaminophen (TYLENOL) 500 MG tablet TAKE ONE TABLET BY MOUTH THREE TIMES A DAY AS NEEDED FOR MILD TO MODERATE PAIN, FEVER, BODY ACHES, HEADACHE   Alcohol Swabs (ALCOHOL WIPES) 70 % PADS USE FOR INSULIN INJECTIONS OR TO FINGER STICK AS DIRECTED   allopurinol (ZYLOPRIM) 300 MG tablet Take 1 tablet (300 mg total) by mouth daily.   amLODipine (NORVASC) 5 MG tablet Take 1 tablet (5 mg total) by mouth daily.   aspirin 81 MG tablet Take 81 mg by mouth daily.   calcium carbonate (OS-CAL) 600 MG TABS tablet Take 600 mg by mouth daily.   carvedilol (COREG) 3.125 MG tablet Take 1 tablet (3.125 mg total) by mouth daily.   cetirizine (ZYRTEC) 10 MG tablet Take 10 mg by mouth daily.   Continuous Blood Gluc Receiver (FREESTYLE LIBRE 14 DAY READER) DEVI 1 Device by Does not apply route 4 (four) times daily. E11.9   Continuous Blood Gluc Sensor (FREESTYLE LIBRE SENSOR SYSTEM) MISC 1 Device by Does not apply route every 14 (fourteen) days. E11.9   furosemide (LASIX) 40 MG tablet Take 1 tablet (40 mg total) by mouth daily as needed for edema.   gabapentin (NEURONTIN) 600 MG tablet 1 tab by mouth twice per day and 2 tabs at bedtime   HYDROcodone-acetaminophen (NORCO/VICODIN) 5-325 MG tablet  Take 1 tablet by mouth 2 (two) times daily as needed for moderate pain.   insulin glargine (LANTUS SOLOSTAR) 100 UNIT/ML Solostar Pen Inject 60 Units into the skin daily at 10 pm.   Insulin Pen Needle (BD PEN NEEDLE NANO U/F) 32G X 4 MM MISC 1 with pen use daily   metFORMIN (GLUCOPHAGE) 1000 MG tablet Take 1 tablet (1,000 mg total) by mouth 2 (two) times daily with a meal.   nitroGLYCERIN (NITROSTAT) 0.4 MG SL tablet Place 1 tablet (0.4 mg total) under the tongue every 5 (five) minutes as needed for chest pain.   Omega-3 Fatty Acids (FISH OIL) 1000 MG CAPS  TAKE TWO CAPSULES BY MOUTH DAILY ELEVATED TRIGLYCERIDES   omeprazole (PRILOSEC) 20 MG capsule Take 1 capsule (20 mg total) by mouth 2 (two) times daily before a meal.   PARoxetine (PAXIL) 40 MG tablet Take 0.5 tablets by mouth daily.   potassium chloride (KLOR-CON) 8 MEQ tablet Take 1 tablet (8 mEq total) by mouth daily.   rOPINIRole (REQUIP) 3 MG tablet Take 3 tablets (9 mg total) by mouth at bedtime.   rosuvastatin (CRESTOR) 10 MG tablet Take 1 tablet (10 mg total) by mouth daily.   tamsulosin (FLOMAX) 0.4 MG CAPS capsule TAKE ONE CAPSULE BY MOUTH ONCE A DAY FOR URINATION AND PROSTATE     Allergies  Allergen Reactions   Losartan     Other reaction(s): Angioedema (ALLERGY/intolerance)   Atorvastatin     Other reaction(s): Muscle pain   Keflex [Cephalexin]     unknown   Lisinopril Cough   Penicillins Rash    Has patient had a PCN reaction causing immediate rash, facial/tongue/throat swelling, SOB or lightheadedness with hypotension: No Has patient had a PCN reaction causing severe rash involving mucus membranes or skin necrosis: No Has patient had a PCN reaction that required hospitalization: No Has patient had a PCN reaction occurring within the last 10 years: No If all of the above answers are "NO", then may proceed with Cephalosporin use.    Sulfa Antibiotics Rash    rash   Sulfasalazine Rash    rash    Social History   Socioeconomic History   Marital status: Divorced    Spouse name: Not on file   Number of children: 3   Years of education: 9   Highest education level: Not on file  Occupational History   Occupation: retired    Comment: former truck Geophysicist/field seismologist  Tobacco Use   Smoking status: Former    Packs/day: 1.50    Years: 43.00    Pack years: 64.50    Types: Cigarettes    Quit date: 05/22/1988    Years since quitting: 32.7   Smokeless tobacco: Never  Vaping Use   Vaping Use: Never used  Substance and Sexual Activity   Alcohol use: No   Drug use: No   Sexual  activity: Not on file  Other Topics Concern   Not on file  Social History Narrative   Fun/Hobby: Work    Denies abuse and feels safe at home.    Social Determinants of Health   Financial Resource Strain: Low Risk    Difficulty of Paying Living Expenses: Not hard at all  Food Insecurity: No Food Insecurity   Worried About Charity fundraiser in the Last Year: Never true   [place] in the Last Year: Never true  Transportation Needs: No Transportation Needs   Lack of Transportation (Medical): No   Lack  of Transportation (Non-Medical): No  Physical Activity: Inactive   Days of Exercise per Week: 0 days   Minutes of Exercise per Session: 0 min  Stress: No Stress Concern Present   Feeling of Stress : Not at all  Social Connections: Moderately Isolated   Frequency of Communication with Friends and Family: More than three times a week   Frequency of Social Gatherings with Friends and Family: Once a week   Attends Religious Services: Never   Marine scientist or Organizations: No   Attends Music therapist: Never   Marital Status: Married  Human resources officer Violence: Not At Risk   Fear of Current or Ex-Partner: No   Emotionally Abused: No   Physically Abused: No   Sexually Abused: No     Review of Systems: General: negative for chills, fever, night sweats or weight changes.  Cardiovascular: negative for chest pain, dyspnea on exertion, edema, orthopnea, palpitations, paroxysmal nocturnal dyspnea or shortness of breath Dermatological: negative for rash Respiratory: negative for cough or wheezing Urologic: negative for hematuria Abdominal: negative for nausea, vomiting, diarrhea, bright red blood per rectum, melena, or hematemesis Neurologic: negative for visual changes, syncope, or dizziness All other systems reviewed and are otherwise negative except as noted above.    Blood pressure (!) 158/77, pulse 66, height 5\' 10"  (1.778 m), weight 263 lb 6.4 oz  (119.5 kg), SpO2 96 %.  General appearance: alert and no distress Neck: no adenopathy, no carotid bruit, no JVD, supple, symmetrical, trachea midline, and thyroid not enlarged, symmetric, no tenderness/mass/nodules Lungs: clear to auscultation bilaterally Heart: regular rate and rhythm, S1, S2 normal, no murmur, click, rub or gallop Extremities: extremities normal, atraumatic, no cyanosis or edema Pulses: 2+ and symmetric Skin: Skin color, texture, turgor normal. No rashes or lesions Neurologic: Grossly normal  EKG sinus bradycardia 48 with incomplete right bundle branch block and nonspecific ST and T wave changes.  Personally reviewed this EKG.  ASSESSMENT AND PLAN:   Essential hypertension History of essential hypertension with blood pressure measured today at 158/77.  He is on amlodipine and carvedilol.  Hyperlipidemia History of hyperlipidemia on statin therapy with lipid profile performed 02/17/2021 revealing total cholesterol 106, LDL 40 and HDL 32.  CAD (coronary artery disease), native coronary artery History of several cardiac catheterizations in the past, 1 by me in 2015 and 1 by Dr. Philbert Maldonado at [facility] after that that showed no obstructive disease and normal LV function.     Aaron Harp MD FACP,FACC,FAHA, [facility] 02/25/2021 2:42 PM

## 2021-04-08 ENCOUNTER — Ambulatory Visit: Payer: Medicare Other | Admitting: Student

## 2021-06-22 ENCOUNTER — Ambulatory Visit (INDEPENDENT_AMBULATORY_CARE_PROVIDER_SITE_OTHER): Payer: Medicare Other

## 2021-06-22 ENCOUNTER — Other Ambulatory Visit: Payer: Self-pay

## 2021-06-22 ENCOUNTER — Encounter: Payer: Self-pay | Admitting: Internal Medicine

## 2021-06-22 ENCOUNTER — Ambulatory Visit (INDEPENDENT_AMBULATORY_CARE_PROVIDER_SITE_OTHER): Payer: Medicare Other | Admitting: Internal Medicine

## 2021-06-22 VITALS — BP 140/70 | HR 57 | Temp 98.6°F | Ht 70.0 in | Wt 260.0 lb

## 2021-06-22 DIAGNOSIS — R059 Cough, unspecified: Secondary | ICD-10-CM | POA: Diagnosis not present

## 2021-06-22 DIAGNOSIS — F5101 Primary insomnia: Secondary | ICD-10-CM

## 2021-06-22 DIAGNOSIS — E559 Vitamin D deficiency, unspecified: Secondary | ICD-10-CM

## 2021-06-22 DIAGNOSIS — I10 Essential (primary) hypertension: Secondary | ICD-10-CM | POA: Diagnosis not present

## 2021-06-22 DIAGNOSIS — E1142 Type 2 diabetes mellitus with diabetic polyneuropathy: Secondary | ICD-10-CM

## 2021-06-22 MED ORDER — CHOLECALCIFEROL 50 MCG (2000 UT) PO TABS: ORAL_TABLET | ORAL | 99 refills | Status: AC

## 2021-06-22 MED ORDER — TRAZODONE HCL 50 MG PO TABS: 50.0000 mg | ORAL_TABLET | Freq: Every evening | ORAL | 1 refills | Status: DC | PRN

## 2021-06-22 NOTE — Patient Instructions (Signed)
Ok to increase the insulin to 60 units per day  Please take all new medication as prescribed - the trazodone at bedtime for sleep  Please double up on the Vitamin D that you take now  Please continue all other medications as before, and refills have been done if requested.  Please have the pharmacy call with any other refills you may need.  Please continue your efforts at being more active, low cholesterol diabetic diet, and weight control.  Please keep your appointments with your specialists as you may have planned  Please go to the XRAY Department in the first floor for the x-ray testing  You will be contacted by phone if any changes need to be made immediately.  Otherwise, you will receive a letter about your results with an explanation, but please check with MyChart first.  Please remember to sign up for MyChart if you have not done so, as this will be important to you in the future with finding out test results, communicating by private email, and scheduling acute appointments online when needed.  We'll see you back in 2 months as scheduled

## 2021-06-22 NOTE — Progress Notes (Signed)
Patient ID: Aaron Maldonado, male   DOB: 1945-11-24, 76 y.o.   MRN: [id number]        Chief Complaint: follow up cough, , dm, low vit d, insomnia       HPI:  Aaron Maldonado is a 76 y.o. male here with c/o 1 wk recurring cough with "gob" of phlegm in the am, none later in the day and Pt denies chest pain, increased sob or doe, wheezing, orthopnea, PND, increased LE swelling, palpitations, dizziness or syncope.   Pt denies polydipsia, polyuria, or new focal neuro s/s.   Pt denies fever, wt loss, night sweats, loss of appetite, or other constitutional symptoms  Taking vit d 2000 u qd.  Has worsening sleep recently, hard to get to sleep, Denies worsening depressive symptoms, suicidal ideation, or panic, but has some increased stress recently, wife is ill.        Wt Readings from Last 3 Encounters:  06/22/21 260 lb (117.9 kg)  02/25/21 263 lb 6.4 oz (119.5 kg)  02/17/21 267 lb (121.1 kg)   BP Readings from Last 3 Encounters:  06/22/21 140/70  02/25/21 (!) 158/77  02/17/21 (!) 156/66         Past Medical History:  Diagnosis Date   Abnormal nuclear stress test 12/29/2013   Allergy    Arthritis    Chest pain    CHF (congestive heart failure) (HCC)    Chicken pox    Colon polyps    Coronary artery disease    Depression    Diabetes mellitus without complication (HCC)    Diverticulitis    Dyspnea on exertion    GERD (gastroesophageal reflux disease)    Heart murmur    History of heart surgery    Hyperlipidemia    Hypertension    Stroke [facility])    Past Surgical History:  Procedure Laterality Date   COLONOSCOPY     KNEE SURGERY     LEFT HEART CATHETERIZATION WITH CORONARY ANGIOGRAM N/A 01/06/2014   Procedure: LEFT HEART CATHETERIZATION WITH CORONARY ANGIOGRAM;  Surgeon: Lorretta Harp, MD;  Location: [facility] CATH LAB;  Service: Cardiovascular;  Laterality: N/A;   SHOULDER SURGERY     TONSILLECTOMY      reports that he quit smoking about 33 years ago. His smoking use included  cigarettes. He has a 64.50 pack-year smoking history. He has never used smokeless tobacco. He reports that he does not drink alcohol and does not use drugs. family history includes Alzheimer's disease in his father; Breast cancer in his sister; Heart disease in his mother; Heart failure in his mother; Hyperlipidemia in his maternal grandfather and maternal grandmother; Hypertension in his maternal grandfather, maternal grandmother, paternal grandfather, and paternal grandmother; 63 / [place] in his maternal grandfather and maternal grandmother; Mitral valve prolapse in his mother; SIDS in his daughter. Allergies  Allergen Reactions   Losartan     Other reaction(s): Angioedema (ALLERGY/intolerance)   Atorvastatin     Other reaction(s): Muscle pain   Keflex [Cephalexin]     unknown   Lisinopril Cough   Penicillins Rash    Has patient had a PCN reaction causing immediate rash, facial/tongue/throat swelling, SOB or lightheadedness with hypotension: No Has patient had a PCN reaction causing severe rash involving mucus membranes or skin necrosis: No Has patient had a PCN reaction that required hospitalization: No Has patient had a PCN reaction occurring within the last 10 years: No If all of the above answers are "NO", then may proceed  with Cephalosporin use.    Sulfa Antibiotics Rash    rash   Sulfasalazine Rash    rash   Current Outpatient Medications on File Prior to Visit  Medication Sig Dispense Refill   acetaminophen (TYLENOL) 500 MG tablet TAKE ONE TABLET BY MOUTH THREE TIMES A DAY AS NEEDED FOR MILD TO MODERATE PAIN, FEVER, BODY ACHES, HEADACHE     Alcohol Swabs (ALCOHOL WIPES) 70 % PADS USE FOR INSULIN INJECTIONS OR TO FINGER STICK AS DIRECTED     allopurinol (ZYLOPRIM) 300 MG tablet Take 1 tablet (300 mg total) by mouth daily. 90 tablet 3   amLODipine (NORVASC) 5 MG tablet Take 1 tablet (5 mg total) by mouth daily. 90 tablet 3   aspirin 81 MG tablet Take 81 mg by mouth  daily.     calcium carbonate (OS-CAL) 600 MG TABS tablet Take 600 mg by mouth daily.     carvedilol (COREG) 3.125 MG tablet Take 1 tablet (3.125 mg total) by mouth daily. 90 tablet 4   cetirizine (ZYRTEC) 10 MG tablet Take 10 mg by mouth daily.     Continuous Blood Gluc Receiver (FREESTYLE LIBRE 14 DAY READER) DEVI 1 Device by Does not apply route 4 (four) times daily. E11.9 1 Device 0   Continuous Blood Gluc Sensor (FREESTYLE LIBRE SENSOR SYSTEM) MISC 1 Device by Does not apply route every 14 (fourteen) days. E11.9 6 each 3   furosemide (LASIX) 40 MG tablet Take 1 tablet (40 mg total) by mouth daily as needed for edema. 90 tablet 3   gabapentin (NEURONTIN) 600 MG tablet 1 tab by mouth twice per day and 2 tabs at bedtime 360 tablet 1   HYDROcodone-acetaminophen (NORCO/VICODIN) 5-325 MG tablet Take 1 tablet by mouth 2 (two) times daily as needed for moderate pain. 14 tablet 0   insulin glargine (LANTUS SOLOSTAR) 100 UNIT/ML Solostar Pen Inject 60 Units into the skin daily at 10 pm. 15 mL 5   Insulin Pen Needle (BD PEN NEEDLE NANO U/F) 32G X 4 MM MISC 1 with pen use daily 100 each 3   metFORMIN (GLUCOPHAGE) 1000 MG tablet Take 1 tablet (1,000 mg total) by mouth 2 (two) times daily with a meal. 180 tablet 3   nitroGLYCERIN (NITROSTAT) 0.4 MG SL tablet Place 1 tablet (0.4 mg total) under the tongue every 5 (five) minutes as needed for chest pain. 90 tablet 3   Omega-3 Fatty Acids (FISH OIL) 1000 MG CAPS TAKE TWO CAPSULES BY MOUTH DAILY ELEVATED TRIGLYCERIDES     omeprazole (PRILOSEC) 20 MG capsule Take 1 capsule (20 mg total) by mouth 2 (two) times daily before a meal. 180 capsule 3   PARoxetine (PAXIL) 40 MG tablet Take 0.5 tablets by mouth daily.     potassium chloride (KLOR-CON) 8 MEQ tablet Take 1 tablet (8 mEq total) by mouth daily. 90 tablet 3   rOPINIRole (REQUIP) 3 MG tablet Take 3 tablets (9 mg total) by mouth at bedtime. 90 tablet 11   rosuvastatin (CRESTOR) 10 MG tablet Take 1 tablet (10 mg  total) by mouth daily. 90 tablet 3   tamsulosin (FLOMAX) 0.4 MG CAPS capsule TAKE ONE CAPSULE BY MOUTH ONCE A DAY FOR URINATION AND PROSTATE     No current facility-administered medications on file prior to visit.        ROS:  All others reviewed and negative.  Objective        PE:  BP 140/70 (BP Location: Left Arm, Patient Position: Sitting, Cuff  Size: Large)    Pulse (!) 57    Temp 98.6 F (37 C) (Oral)    Ht 5\' 10"  (1.778 m)    Wt 260 lb (117.9 kg)    SpO2 97%    BMI 37.31 kg/m                 Constitutional: Pt appears in NAD               HENT: Head: NCAT.                Right Ear: External ear normal.                 Left Ear: External ear normal.                Eyes: . Pupils are equal, round, and reactive to light. Conjunctivae and EOM are normal               Nose: without d/c or deformity               Neck: Neck supple. Gross normal ROM               Cardiovascular: Normal rate and regular rhythm.                 Pulmonary/Chest: Effort normal and breath sounds without rales or wheezing.                Abd:  Soft, NT, ND, + BS, no organomegaly               Neurological: Pt is alert. At baseline orientation, motor grossly intact               Skin: Skin is warm. No rashes, no other new lesions, LE edema - none               Psychiatric: Pt behavior is normal without agitation   Micro: none  Cardiac tracings I have personally interpreted today:  none  Pertinent Radiological findings (summarize): none   Lab Results  Component Value Date   WBC 9.1 08/19/2020   HGB 13.6 08/19/2020   HCT 40.7 08/19/2020   PLT 172.0 08/19/2020   GLUCOSE 176 (H) 02/17/2021   CHOL 106 02/17/2021   TRIG 174.0 (H) 02/17/2021   HDL 32.00 (L) 02/17/2021   LDLDIRECT 52.0 08/19/2020   LDLCALC 40 02/17/2021   ALT 17 02/17/2021   AST 18 02/17/2021   NA 139 02/17/2021   K 4.5 02/17/2021   CL 101 02/17/2021   CREATININE 1.26 02/17/2021   BUN 20 02/17/2021   CO2 33 (H) 02/17/2021   TSH  2.26 08/19/2020   PSA 1.71 08/19/2020   INR 0.93 12/29/2013   HGBA1C 7.9 (H) 02/17/2021   MICROALBUR 13.3 (H) 08/19/2020   Assessment/Plan:  Aaron Maldonado is a 76 y.o. White or Caucasian [1] male with  has a past medical history of Abnormal nuclear stress test (12/29/2013), Allergy, Arthritis, Chest pain, CHF (congestive heart failure) ([place]), Chicken pox, Colon polyps, Coronary artery disease, Depression, Diabetes mellitus without complication ([place]), Diverticulitis, Dyspnea on exertion, GERD (gastroesophageal reflux disease), Heart murmur, History of heart surgery, Hyperlipidemia, Hypertension, and Stroke ([place]).  Cough New worsening in the past wk, afeb, non toxic, but for cxr r/o pna  Diabetes ([place]) Lab Results  Component Value Date   HGBA1C 7.9 (H) 02/17/2021   uncontrolled, pt to increase lantus to 60 units dialy   Essential  hypertension BP Readings from Last 3 Encounters:  06/22/21 140/70  02/25/21 (!) 158/77  02/17/21 (!) 156/66   Stable, pt to continue medical treatment norvasc, coreg   Vitamin D deficiency Last vitamin D Lab Results  Component Value Date   VD25OH 39.30 02/17/2021   Low, to increase oral replacement 2000 u or double what he is taking  Insomnia Mild to mod worsening, for add trazodone qhs prn,  to f/u any worsening symptoms or concerns  Followup: Return in about 2 months (around 08/20/2021).  Cathlean Cower, MD 06/26/2021 5:39 PM [facility]

## 2021-06-26 ENCOUNTER — Encounter: Payer: Self-pay | Admitting: Internal Medicine

## 2021-06-26 DIAGNOSIS — R059 Cough, unspecified: Secondary | ICD-10-CM | POA: Insufficient documentation

## 2021-06-26 NOTE — Assessment & Plan Note (Signed)
BP Readings from Last 3 Encounters:  06/22/21 140/70  02/25/21 (!) 158/77  02/17/21 (!) 156/66   Stable, pt to continue medical treatment norvasc, coreg

## 2021-06-26 NOTE — Assessment & Plan Note (Signed)
New worsening in the past wk, afeb, non toxic, but for cxr r/o pna

## 2021-06-26 NOTE — Assessment & Plan Note (Signed)
Lab Results  Component Value Date   HGBA1C 7.9 (H) 02/17/2021   uncontrolled, pt to increase lantus to 60 units dialy

## 2021-06-26 NOTE — Assessment & Plan Note (Signed)
Last vitamin D Lab Results  Component Value Date   VD25OH 39.30 02/17/2021   Low, to increase oral replacement 2000 u or double what he is taking

## 2021-06-26 NOTE — Assessment & Plan Note (Signed)
Mild to mod worsening, for add trazodone qhs prn,  to f/u any worsening symptoms or concerns

## 2021-08-18 ENCOUNTER — Encounter: Payer: Self-pay | Admitting: Internal Medicine

## 2021-08-18 ENCOUNTER — Ambulatory Visit (INDEPENDENT_AMBULATORY_CARE_PROVIDER_SITE_OTHER): Payer: Medicare Other | Admitting: Internal Medicine

## 2021-08-18 VITALS — BP 140/76 | HR 56 | Temp 99.1°F | Ht 70.0 in | Wt 257.0 lb

## 2021-08-18 DIAGNOSIS — E538 Deficiency of other specified B group vitamins: Secondary | ICD-10-CM

## 2021-08-18 DIAGNOSIS — E1142 Type 2 diabetes mellitus with diabetic polyneuropathy: Secondary | ICD-10-CM | POA: Diagnosis not present

## 2021-08-18 DIAGNOSIS — I10 Essential (primary) hypertension: Secondary | ICD-10-CM | POA: Diagnosis not present

## 2021-08-18 DIAGNOSIS — N1831 Chronic kidney disease, stage 3a: Secondary | ICD-10-CM | POA: Diagnosis not present

## 2021-08-18 DIAGNOSIS — I739 Peripheral vascular disease, unspecified: Secondary | ICD-10-CM

## 2021-08-18 DIAGNOSIS — Z0001 Encounter for general adult medical examination with abnormal findings: Secondary | ICD-10-CM

## 2021-08-18 DIAGNOSIS — E559 Vitamin D deficiency, unspecified: Secondary | ICD-10-CM | POA: Diagnosis not present

## 2021-08-18 LAB — LIPID PANEL
Cholesterol: 139 mg/dL (ref 0–200)
HDL: 30.2 mg/dL — ABNORMAL LOW (ref >39.00)
NonHDL: 109.05
Total CHOL/HDL Ratio: 5
Triglycerides: 378 mg/dL — ABNORMAL HIGH (ref 0.0–149.0)
VLDL: 75.6 mg/dL — ABNORMAL HIGH (ref 0.0–40.0)

## 2021-08-18 LAB — CBC WITH DIFFERENTIAL/PLATELET
Basophils Absolute: 0 10*3/uL (ref 0.0–0.1)
Basophils Relative: 0.3 % (ref 0.0–3.0)
Eosinophils Absolute: 0.2 10*3/uL (ref 0.0–0.7)
Eosinophils Relative: 2.1 % (ref 0.0–5.0)
HCT: 41.5 % (ref 39.0–52.0)
Hemoglobin: 14.2 g/dL (ref 13.0–17.0)
Lymphocytes Relative: 26.1 % (ref 12.0–46.0)
Lymphs Abs: 2.4 10*3/uL (ref 0.7–4.0)
MCHC: 34.2 g/dL (ref 30.0–36.0)
MCV: 96.5 fl (ref 78.0–100.0)
Monocytes Absolute: 0.5 10*3/uL (ref 0.1–1.0)
Monocytes Relative: 5.6 % (ref 3.0–12.0)
Neutro Abs: 6 10*3/uL (ref 1.4–7.7)
Neutrophils Relative %: 65.9 % (ref 43.0–77.0)
Platelets: 138 10*3/uL — ABNORMAL LOW (ref 150.0–400.0)
RBC: 4.3 Mil/uL (ref 4.22–5.81)
RDW: 13.1 % (ref 11.5–15.5)
WBC: 9.1 10*3/uL (ref 4.0–10.5)

## 2021-08-18 LAB — LDL CHOLESTEROL, DIRECT: Direct LDL: 69 mg/dL

## 2021-08-18 LAB — URINALYSIS, ROUTINE W REFLEX MICROSCOPIC
Bilirubin Urine: NEGATIVE
Hgb urine dipstick: NEGATIVE
Ketones, ur: NEGATIVE
Leukocytes,Ua: NEGATIVE
Nitrite: NEGATIVE
RBC / HPF: NONE SEEN (ref 0–?)
Specific Gravity, Urine: 1.02 (ref 1.000–1.030)
Urine Glucose: >=1000 — AB
Urobilinogen, UA: 0.2 (ref 0.0–1.0)
pH: 6 (ref 5.0–8.0)

## 2021-08-18 LAB — BASIC METABOLIC PANEL
BUN: 14 mg/dL (ref 6–23)
CO2: 33 mEq/L — ABNORMAL HIGH (ref 19–32)
Calcium: 9.8 mg/dL (ref 8.4–10.5)
Chloride: 99 mEq/L (ref 96–112)
Creatinine, Ser: 1.23 mg/dL (ref 0.40–1.50)
GFR: 57.05 mL/min — ABNORMAL LOW (ref >60.00)
Glucose, Bld: 304 mg/dL — ABNORMAL HIGH (ref 70–99)
Potassium: 4 mEq/L (ref 3.5–5.1)
Sodium: 138 mEq/L (ref 135–145)

## 2021-08-18 LAB — TSH: TSH: 3.23 u[IU]/mL (ref 0.35–5.50)

## 2021-08-18 LAB — HEPATIC FUNCTION PANEL
ALT: 21 U/L (ref 0–53)
AST: 18 U/L (ref 0–37)
Albumin: 4 g/dL (ref 3.5–5.2)
Alkaline Phosphatase: 77 U/L (ref 39–117)
Bilirubin, Direct: 0.2 mg/dL (ref 0.0–0.3)
Total Bilirubin: 1.3 mg/dL — ABNORMAL HIGH (ref 0.2–1.2)
Total Protein: 6.6 g/dL (ref 6.0–8.3)

## 2021-08-18 LAB — MICROALBUMIN / CREATININE URINE RATIO
Creatinine,U: 99.7 mg/dL
Microalb Creat Ratio: 7.2 mg/g (ref 0.0–30.0)
Microalb, Ur: 7.2 mg/dL — ABNORMAL HIGH (ref 0.0–1.9)

## 2021-08-18 LAB — HEMOGLOBIN A1C: Hgb A1c MFr Bld: 10 % — ABNORMAL HIGH (ref 4.6–6.5)

## 2021-08-18 LAB — VITAMIN D 25 HYDROXY (VIT D DEFICIENCY, FRACTURES): VITD: 29.7 ng/mL — ABNORMAL LOW (ref 30.00–100.00)

## 2021-08-18 LAB — VITAMIN B12: Vitamin B-12: 687 pg/mL (ref 211–911)

## 2021-08-18 LAB — PSA: PSA: 1.77 ng/mL (ref 0.10–4.00)

## 2021-08-18 MED ORDER — OMEPRAZOLE 20 MG PO CPDR: 20.0000 mg | DELAYED_RELEASE_CAPSULE | Freq: Two times a day (BID) | ORAL | 3 refills | Status: AC

## 2021-08-18 MED ORDER — PAROXETINE HCL 40 MG PO TABS: 20.0000 mg | ORAL_TABLET | Freq: Every day | ORAL | 3 refills | Status: AC

## 2021-08-18 MED ORDER — AMLODIPINE BESYLATE 5 MG PO TABS: 5.0000 mg | ORAL_TABLET | Freq: Every day | ORAL | 3 refills | Status: AC

## 2021-08-18 MED ORDER — ROPINIROLE HCL 3 MG PO TABS: 9.0000 mg | ORAL_TABLET | Freq: Every day | ORAL | 3 refills | Status: AC

## 2021-08-18 MED ORDER — ROSUVASTATIN CALCIUM 10 MG PO TABS: 10.0000 mg | ORAL_TABLET | Freq: Every day | ORAL | 3 refills | Status: AC

## 2021-08-18 MED ORDER — GABAPENTIN 600 MG PO TABS: ORAL_TABLET | ORAL | 1 refills | Status: AC

## 2021-08-18 MED ORDER — ALLOPURINOL 300 MG PO TABS: 300.0000 mg | ORAL_TABLET | Freq: Every day | ORAL | 3 refills | Status: AC

## 2021-08-18 MED ORDER — POTASSIUM CHLORIDE ER 8 MEQ PO TBCR: 8.0000 meq | EXTENDED_RELEASE_TABLET | Freq: Every day | ORAL | 3 refills | Status: AC

## 2021-08-18 MED ORDER — METFORMIN HCL 1000 MG PO TABS: 1000.0000 mg | ORAL_TABLET | Freq: Two times a day (BID) | ORAL | 3 refills | Status: AC

## 2021-08-18 MED ORDER — TRAZODONE HCL 50 MG PO TABS: 50.0000 mg | ORAL_TABLET | Freq: Every evening | ORAL | 1 refills | Status: AC | PRN

## 2021-08-18 MED ORDER — FUROSEMIDE 40 MG PO TABS: 40.0000 mg | ORAL_TABLET | Freq: Every day | ORAL | 3 refills | Status: AC | PRN

## 2021-08-18 MED ORDER — TAMSULOSIN HCL 0.4 MG PO CAPS: ORAL_CAPSULE | ORAL | 3 refills | Status: AC

## 2021-08-18 MED ORDER — LANTUS SOLOSTAR 100 UNIT/ML ~~LOC~~ SOPN: 60.0000 [IU] | PEN_INJECTOR | Freq: Every day | SUBCUTANEOUS | 5 refills | Status: AC

## 2021-08-18 MED ORDER — CARVEDILOL 6.25 MG PO TABS: 6.2500 mg | ORAL_TABLET | Freq: Two times a day (BID) | ORAL | 3 refills | Status: AC

## 2021-08-18 NOTE — Assessment & Plan Note (Signed)
Lab Results  ?Component Value Date  ? [id number] 08/18/2021  ? ?Stable, cont oral replacement - b12 1000 mcg qd ? ?

## 2021-08-18 NOTE — Assessment & Plan Note (Signed)
Home screening is c/w mild RLE abnormal, for LE arterial study ?

## 2021-08-18 NOTE — Progress Notes (Signed)
Patient ID: Aaron Maldonado, male   DOB: [birth year], 76 y.o.   MRN: [id number] ? ? ? ?     Chief Complaint:: wellness exam and Follow-up (6 month f/u) ? Dm, PAD, tremor htn ? ?     HPI:  Aaron Maldonado is a 76 y.o. male here for wellness exam; already up to date  ?              Also visited by insurance nurse with finding mild RLE PAD; Pt denies chest pain, increased sob or doe, wheezing, orthopnea, PND, increased LE swelling, palpitations, dizziness or syncope.   Pt denies polydipsia, polyuria, or new focal neuro s/s.    Pt denies fever, wt loss, night sweats, loss of appetite, or other constitutional symptoms  Does have mild worsening right hand tremor in the last 3 months, nothing makes better or worse, intermittent.  Also states has been out of all meds for 2 wks as has had difficulty with [facility] ?Wt Readings from Last 3 Encounters:  ?08/18/21 257 lb (116.6 kg)  ?06/22/21 260 lb (117.9 kg)  ?02/25/21 263 lb 6.4 oz (119.5 kg)  ? ?BP Readings from Last 3 Encounters:  ?08/18/21 140/76  ?06/22/21 140/70  ?02/25/21 (!) 158/77  ? ?Immunization History  ?Administered Date(s) Administered  ? Fluad Quad(high Dose 65+) 02/01/2019, 04/08/2020, 02/17/2021  ? Influenza, High Dose Seasonal PF 02/16/2014, 02/21/2016, 02/19/2017, 03/06/2017  ? Influenza, Seasonal, Injecte, Preservative Fre 04/11/2015  ? Influenza-Unspecified 03/25/2012, 04/15/2013, 03/13/2017, 01/20/2018, 03/18/2018, 02/04/2019, 02/17/2021  ? Moderna Covid-19 Vaccine Bivalent Booster 59yr & up 04/21/2021  ? Moderna Sars-Covid-2 Vaccination 07/22/2019, 08/20/2019, 05/05/2020  ? Pneumococcal Conjugate-13 02/16/2014, 03/23/2016  ? Pneumococcal Polysaccharide-23 11/07/2010, 01/10/2013, 09/21/2017  ? Tdap 05/22/2009, 01/10/2013, 03/23/2017  ? Tetanus 10/25/2000, 05/22/2009  ? Zoster Recombinat (Shingrix) 07/27/2020, 05/24/2021  ? ?There are no preventive care reminders to display for this patient. ? ?  ? ?Past Medical History:  ?Diagnosis Date  ? Abnormal  nuclear stress test 12/29/2013  ? Allergy   ? Arthritis   ? Chest pain   ? [place] heart [place]) [place]   ? Chicken pox   ? Colon polyps   ? Coronary artery disease   ? Depression   ? [place] without complication ([place]   ? Diverticulitis   ? Dyspnea on exertion   ? GERD (gastroesophageal reflux disease)   ? Heart murmur   ? History of heart surgery   ? Hyperlipidemia   ? Hypertension   ? Stroke ([facility]   ? ?Past Surgical History:  ?Procedure Laterality Date  ? COLONOSCOPY    ? KNEE SURGERY    ? LEFT HEART CATHETERIZATION WITH CORONARY ANGIOGRAM N/A 01/06/2014  ? Procedure: LEFT HEART CATHETERIZATION WITH CORONARY ANGIOGRAM;  Surgeon: JLorretta Harp MD;  [facility];  Service: Cardiovascular;  Laterality: N/A;  ? SHOULDER SURGERY    ? TONSILLECTOMY    ? ? reports that he quit smoking about 33 years ago. His smoking use included cigarettes. He has a 64.50 pack-year smoking history. He has never used smokeless tobacco. He reports that he does not drink alcohol and does not use drugs. ?family history includes Alzheimer's disease in his father; Breast cancer in his sister; Heart disease in his mother; Heart failure in his mother; Hyperlipidemia in his maternal grandfather and maternal grandmother; Hypertension in his maternal grandfather, maternal grandmother, paternal grandfather, and paternal grandmother; M29/ [place] his maternal grandfather and maternal grandmother; Mitral valve prolapse in  his mother; SIDS in his daughter. ?Allergies  ?Allergen Reactions  ? Losartan   ?  Other reaction(s): Angioedema (ALLERGY/intolerance)  ? Atorvastatin   ?  Other reaction(s): Muscle pain  ? Keflex [Cephalexin]   ?  unknown  ? Lisinopril Cough  ? Penicillins Rash  ?  Has patient had a PCN reaction causing immediate rash, facial/tongue/throat swelling, SOB or lightheadedness with hypotension: No ?Has patient had a PCN reaction causing severe rash involving mucus membranes or skin necrosis:  No ?Has patient had a PCN reaction that required hospitalization: No ?Has patient had a PCN reaction occurring within the last 10 years: No ?If all of the above answers are "NO", then may proceed with Cephalosporin use. ?  ? Sulfa Antibiotics Rash  ?  rash  ? Sulfasalazine Rash  ?  rash  ? ?Current Outpatient Medications on File Prior to Visit  ?Medication Sig Dispense Refill  ? acetaminophen (TYLENOL) 500 MG tablet TAKE ONE TABLET BY MOUTH THREE TIMES A DAY AS NEEDED FOR MILD TO MODERATE PAIN, FEVER, BODY ACHES, HEADACHE    ? Alcohol Swabs (ALCOHOL WIPES) 70 % PADS USE FOR INSULIN INJECTIONS OR TO FINGER STICK AS DIRECTED    ? aspirin 81 MG tablet Take 81 mg by mouth daily.    ? calcium carbonate (OS-CAL) 600 MG TABS tablet Take 600 mg by mouth daily.    ? cetirizine (ZYRTEC) 10 MG tablet Take 10 mg by mouth daily.    ? Cholecalciferol 50 MCG (2000 UT) TABS 1 tab by mouth once daily 30 tablet 99  ? Continuous Blood Gluc Receiver (FREESTYLE LIBRE 14 DAY READER) DEVI 1 Device by Does not apply route 4 (four) times daily. E11.9 1 Device 0  ? Continuous Blood Gluc Sensor (FREESTYLE LIBRE SENSOR SYSTEM) MISC 1 Device by Does not apply route every 14 (fourteen) days. E11.9 6 each 3  ? Insulin Pen Needle (BD PEN NEEDLE NANO U/F) 32G X 4 MM MISC 1 with pen use daily 100 each 3  ? nitroGLYCERIN (NITROSTAT) 0.4 MG SL tablet Place 1 tablet (0.4 mg total) under the tongue every 5 (five) minutes as needed for chest pain. 90 tablet 3  ? Omega-3 Fatty Acids (FISH OIL) 1000 MG CAPS TAKE TWO CAPSULES BY MOUTH DAILY ELEVATED TRIGLYCERIDES    ? ?No current facility-administered medications on file prior to visit.  ? ?     ROS:  All others reviewed and negative. ? ?Objective  ? ?     PE:  BP 140/76 (BP Location: Right Arm, Patient Position: Sitting, Cuff Size: Large)   Pulse (!) 56   Temp 99.1 ?F (37.3 ?C) (Oral)   Ht '5\' 10"'$  (1.778 m)   Wt 257 lb (116.6 kg)   SpO2 95%   BMI 36.88 kg/m?  ? ?              Constitutional: Pt  appears in NAD ?              HENT: Head: NCAT.  ?              Right Ear: External ear normal.   ?              Left Ear: External ear normal.  ?              Eyes: . Pupils are equal, round, and reactive to light. Conjunctivae and EOM are normal ?  Nose: without d/c or deformity ?              Neck: Neck supple. Gross normal ROM ?              Cardiovascular: Normal rate and regular rhythm.   ?              Pulmonary/Chest: Effort normal and breath sounds without rales or wheezing.  ?              Abd:  Soft, NT, ND, + BS, no organomegaly ?              Neurological: Pt is alert. At baseline orientation, motor grossly intact ?              Skin: Skin is warm. No rashes, no other new lesions, LE edema - none ?              Psychiatric: Pt behavior is normal without agitation  ? ?Micro: none ? ?Cardiac tracings I have personally interpreted today:  none ? ?Pertinent Radiological findings (summarize): none  ? ?Lab Results  ?Component Value Date  ? WBC 9.1 08/18/2021  ? HGB 14.2 08/18/2021  ? HCT 41.5 08/18/2021  ? PLT 138.0 (L) 08/18/2021  ? GLUCOSE 304 (H) 08/18/2021  ? CHOL 139 08/18/2021  ? TRIG 378.0 (H) 08/18/2021  ? HDL 30.20 (L) 08/18/2021  ? LDLDIRECT 69.0 08/18/2021  ? [place] 40 02/17/2021  ? ALT 21 08/18/2021  ? AST 18 08/18/2021  ? NA 138 08/18/2021  ? K 4.0 08/18/2021  ? CL 99 08/18/2021  ? CREATININE 1.23 08/18/2021  ? BUN 14 08/18/2021  ? CO2 33 (H) 08/18/2021  ? TSH 3.23 08/18/2021  ? PSA 1.77 08/18/2021  ? INR 0.93 12/29/2013  ? HGBA1C 10.0 (H) 08/18/2021  ? MICROALBUR 7.2 (H) 08/18/2021  ? ?Assessment/Plan:  ?XUE LOW is a 76 y.o. White or Caucasian [1] male with  has a past medical history of Abnormal nuclear stress test (12/29/2013), Allergy, Arthritis, Chest pain, CHF (congestive heart failure) ([place]), Chicken pox, Colon polyps, Coronary artery disease, Depression, Diabetes mellitus without complication ([place]), Diverticulitis, Dyspnea on exertion, GERD (gastroesophageal reflux  disease), Heart murmur, History of heart surgery, Hyperlipidemia, Hypertension, and Stroke ([place]). ? ?Encounter for well adult exam with abnormal findings ?Age and sex appropriate education and counseling updated

## 2021-08-18 NOTE — Patient Instructions (Signed)
Ok to increase the coreg to 6.25 mg twice per day for BP and the tremor ? ?You will be contacted regarding the referral for: circulation testing for the legs ? ?Please continue all other medications as before, and refills have been done if requested. ? ?Please have the pharmacy call with any other refills you may need. ? ?Please continue your efforts at being more active, low cholesterol diet, and weight control. ? ?You are otherwise up to date with prevention measures today. ? ?Please keep your appointments with your specialists as you may have planned ? ?Please go to the LAB at the blood drawing area for the tests to be done ? ?You will be contacted by phone if any changes need to be made immediately.  Otherwise, you will receive a letter about your results with an explanation, but please check with MyChart first. ? ?Please remember to sign up for MyChart if you have not done so, as this will be important to you in the future with finding out test results, communicating by private email, and scheduling acute appointments online when needed. ? ?Please make an Appointment to return in 6 months, or sooner if needed ?

## 2021-08-18 NOTE — Assessment & Plan Note (Signed)
Last vitamin D ?Lab Results  ?Component Value Date  ? VD25OH 29.70 (L) 08/18/2021  ?low, to start oral replacement ? ?

## 2021-08-18 NOTE — Assessment & Plan Note (Signed)

## 2021-08-18 NOTE — Assessment & Plan Note (Signed)
Lab Results  ?Component Value Date  ? CREATININE 1.23 08/18/2021  ? ?Stable overall, cont to avoid nephrotoxins ? ?

## 2021-08-18 NOTE — Assessment & Plan Note (Signed)
?  Stable by hx, for a1c today, pt to continue current medical treatment lantus, metformin ? ?

## 2021-08-18 NOTE — Assessment & Plan Note (Signed)
BP Readings from Last 3 Encounters:  ?08/18/21 140/76  ?06/22/21 140/70  ?02/25/21 (!) 158/77  ? ?Mild uncontrolled,, pt for increased coreg 6.25 bid, may help with worsening tremor control as well ? ?

## 2021-09-08 ENCOUNTER — Ambulatory Visit (HOSPITAL_COMMUNITY)
Admission: RE | Admit: 2021-09-08 | Payer: Medicare Other | Source: Ambulatory Visit | Attending: Internal Medicine | Admitting: Internal Medicine

## 2021-11-11 ENCOUNTER — Telehealth: Payer: Self-pay

## 2022-01-18 ENCOUNTER — Ambulatory Visit (INDEPENDENT_AMBULATORY_CARE_PROVIDER_SITE_OTHER): Payer: Medicare Other | Admitting: Internal Medicine

## 2022-01-18 ENCOUNTER — Other Ambulatory Visit: Payer: Self-pay | Admitting: Internal Medicine

## 2022-01-18 ENCOUNTER — Ambulatory Visit (INDEPENDENT_AMBULATORY_CARE_PROVIDER_SITE_OTHER): Payer: Medicare Other

## 2022-01-18 ENCOUNTER — Encounter: Payer: Self-pay | Admitting: Internal Medicine

## 2022-01-18 ENCOUNTER — Ambulatory Visit (INDEPENDENT_AMBULATORY_CARE_PROVIDER_SITE_OTHER): Payer: Medicare Other | Admitting: Sports Medicine

## 2022-01-18 VITALS — BP 134/82 | HR 65 | Ht 70.0 in | Wt 258.0 lb

## 2022-01-18 VITALS — BP 134/82 | HR 59 | Temp 97.4°F | Ht 70.0 in | Wt 258.0 lb

## 2022-01-18 DIAGNOSIS — E559 Vitamin D deficiency, unspecified: Secondary | ICD-10-CM

## 2022-01-18 DIAGNOSIS — G8929 Other chronic pain: Secondary | ICD-10-CM

## 2022-01-18 DIAGNOSIS — Z981 Arthrodesis status: Secondary | ICD-10-CM | POA: Diagnosis not present

## 2022-01-18 DIAGNOSIS — J309 Allergic rhinitis, unspecified: Secondary | ICD-10-CM

## 2022-01-18 DIAGNOSIS — R059 Cough, unspecified: Secondary | ICD-10-CM

## 2022-01-18 DIAGNOSIS — H1010 Acute atopic conjunctivitis, unspecified eye: Secondary | ICD-10-CM

## 2022-01-18 DIAGNOSIS — M545 Low back pain, unspecified: Secondary | ICD-10-CM | POA: Diagnosis not present

## 2022-01-18 DIAGNOSIS — Z87891 Personal history of nicotine dependence: Secondary | ICD-10-CM | POA: Insufficient documentation

## 2022-01-18 DIAGNOSIS — M5136 Other intervertebral disc degeneration, lumbar region: Secondary | ICD-10-CM

## 2022-01-18 DIAGNOSIS — M5030 Other cervical disc degeneration, unspecified cervical region: Secondary | ICD-10-CM | POA: Diagnosis not present

## 2022-01-18 DIAGNOSIS — M542 Cervicalgia: Secondary | ICD-10-CM

## 2022-01-18 DIAGNOSIS — E1142 Type 2 diabetes mellitus with diabetic polyneuropathy: Secondary | ICD-10-CM

## 2022-01-18 DIAGNOSIS — I10 Essential (primary) hypertension: Secondary | ICD-10-CM

## 2022-01-18 LAB — LIPID PANEL
Cholesterol: 87 mg/dL (ref 0–200)
HDL: 25.9 mg/dL — ABNORMAL LOW (ref >39.00)
NonHDL: 61.46
Total CHOL/HDL Ratio: 3
Triglycerides: 307 mg/dL — ABNORMAL HIGH (ref 0.0–149.0)
VLDL: 61.4 mg/dL — ABNORMAL HIGH (ref 0.0–40.0)

## 2022-01-18 LAB — CBC WITH DIFFERENTIAL/PLATELET
Basophils Absolute: 0 10*3/uL (ref 0.0–0.1)
Basophils Relative: 0.1 % (ref 0.0–3.0)
Eosinophils Absolute: 0.3 10*3/uL (ref 0.0–0.7)
Eosinophils Relative: 3 % (ref 0.0–5.0)
HCT: 41.2 % (ref 39.0–52.0)
Hemoglobin: 14 g/dL (ref 13.0–17.0)
Lymphocytes Relative: 29.2 % (ref 12.0–46.0)
Lymphs Abs: 2.9 10*3/uL (ref 0.7–4.0)
MCHC: 33.8 g/dL (ref 30.0–36.0)
MCV: 97.7 fl (ref 78.0–100.0)
Monocytes Absolute: 0.6 10*3/uL (ref 0.1–1.0)
Monocytes Relative: 6 % (ref 3.0–12.0)
Neutro Abs: 6.1 10*3/uL (ref 1.4–7.7)
Neutrophils Relative %: 61.7 % (ref 43.0–77.0)
Platelets: 144 10*3/uL — ABNORMAL LOW (ref 150.0–400.0)
RBC: 4.22 Mil/uL (ref 4.22–5.81)
RDW: 13.3 % (ref 11.5–15.5)
WBC: 10 10*3/uL (ref 4.0–10.5)

## 2022-01-18 LAB — BASIC METABOLIC PANEL
BUN: 19 mg/dL (ref 6–23)
CO2: 31 mEq/L (ref 19–32)
Calcium: 10.2 mg/dL (ref 8.4–10.5)
Chloride: 100 mEq/L (ref 96–112)
Creatinine, Ser: 1.37 mg/dL (ref 0.40–1.50)
GFR: 49.98 mL/min — ABNORMAL LOW (ref >60.00)
Glucose, Bld: 214 mg/dL — ABNORMAL HIGH (ref 70–99)
Potassium: 3.8 mEq/L (ref 3.5–5.1)
Sodium: 138 mEq/L (ref 135–145)

## 2022-01-18 LAB — VITAMIN D 25 HYDROXY (VIT D DEFICIENCY, FRACTURES): VITD: 43.41 ng/mL (ref 30.00–100.00)

## 2022-01-18 LAB — HEPATIC FUNCTION PANEL
ALT: 18 U/L (ref 0–53)
AST: 16 U/L (ref 0–37)
Albumin: 3.9 g/dL (ref 3.5–5.2)
Alkaline Phosphatase: 68 U/L (ref 39–117)
Bilirubin, Direct: 0.3 mg/dL (ref 0.0–0.3)
Total Bilirubin: 1.5 mg/dL — ABNORMAL HIGH (ref 0.2–1.2)
Total Protein: 6.8 g/dL (ref 6.0–8.3)

## 2022-01-18 LAB — HEMOGLOBIN A1C: Hgb A1c MFr Bld: 8.8 % — ABNORMAL HIGH (ref 4.6–6.5)

## 2022-01-18 LAB — LDL CHOLESTEROL, DIRECT: Direct LDL: 37 mg/dL

## 2022-01-18 MED ORDER — AZELASTINE HCL 0.05 % OP SOLN: 1.0000 [drp] | Freq: Two times a day (BID) | OPHTHALMIC | 12 refills | Status: AC

## 2022-01-18 MED ORDER — OZEMPIC (0.25 OR 0.5 MG/DOSE) 2 MG/3ML ~~LOC~~ SOPN: PEN_INJECTOR | SUBCUTANEOUS | 3 refills | Status: AC

## 2022-01-18 MED ORDER — ALBUTEROL SULFATE HFA 108 (90 BASE) MCG/ACT IN AERS: 2.0000 | INHALATION_SPRAY | Freq: Four times a day (QID) | RESPIRATORY_TRACT | 3 refills | Status: AC | PRN

## 2022-01-18 MED ORDER — TRIAMCINOLONE ACETONIDE 55 MCG/ACT NA AERO: 2.0000 | INHALATION_SPRAY | Freq: Every day | NASAL | 3 refills | Status: AC

## 2022-01-18 MED ORDER — CETIRIZINE HCL 10 MG PO TABS: 10.0000 mg | ORAL_TABLET | Freq: Every day | ORAL | 3 refills | Status: AC

## 2022-01-18 MED ORDER — LEVOFLOXACIN 500 MG PO TABS: 500.0000 mg | ORAL_TABLET | Freq: Every day | ORAL | 0 refills | Status: AC

## 2022-01-18 NOTE — Progress Notes (Signed)
Benito Mccreedy D.[place] Phone: [phone]   Assessment and Plan:     1. Neck pain 2. DDD (degenerative disc disease), cervical 3. History of fusion of cervical spine 4. DDD (degenerative disc disease), lumbar 5. Chronic bilateral low back pain without sciatica -Chronic with exacerbation, initial sports medicine visit --Chronic pain primarily focused in lower cervical spine and lower lumbar spine without radicular symptoms, history of cervical spine fusion, significant and diffuse cortical changes on today's x-rays with progression from prior imaging. - X-rays obtained in clinic.  My interpretation: No acute fracture or vertebral collapse.  Diffuse cortical changes throughout cervical, lumbar spine as well as partially imaged thoracic spine with vertebral fusing, facet arthropathies, anterior vertebral spurring. - Due to diffuse spinal changes, with history of decades of neck and low back pain, question inflammatory cause.  Patient had lab work drawn today, so we will try to add on lab work.  If we are unable to add on lab work today, can consider inflammatory work-up at follow-up visit. - Start Tylenol 500 to 1000 mg tablets 2-3 times a day for day-to-day pain relief - Start HEP and physical therapy for low back and neck with goal of decreasing pain level and maintaining ROM - Ambulatory referral to Physical Therapy    Pertinent previous records reviewed include x-ray cervical spine 12/06/2017, x-ray cervical spine 10/29/2017, MRI cervical spine 09/23/2017, x-ray lumbar spine 06/04/2015   Follow Up: 4 weeks for reevaluation.  Could consider lab work-up   Subjective:   I, Moenique Parris, am serving as a Education administrator for Doctor Glennon Mac  Chief Complaint: neck and back pain   HPI:   01/18/22 Patient is a 76 year old male complaining of neck and back pain. Patient states he has been in pain for about 3 weeks,  no MOI, pain in his neck to his upper trap and then just his low back all the way across, pain is mostly when he wakes up in the am , he has pain when he bends over in the sink because of pain, has been taking advil for the pain and that helps he is taking 4 at a time has a hard time getting to sleep has to use melatonin,   Relevant Historical Information: History of cervical spine fusion, poorly controlled DM type II, PAD, hypertension, CKD,  Additional pertinent review of systems negative.   Current Outpatient Medications:    acetaminophen (TYLENOL) 500 MG tablet, TAKE ONE TABLET BY MOUTH THREE TIMES A DAY AS NEEDED FOR MILD TO MODERATE PAIN, FEVER, BODY ACHES, HEADACHE, Disp: , Rfl:    albuterol (VENTOLIN HFA) 108 (90 Base) MCG/ACT inhaler, Inhale 2 puffs into the lungs every 6 (six) hours as needed for wheezing or shortness of breath., Disp: 24 g, Rfl: 3   Alcohol Swabs (ALCOHOL WIPES) 70 % PADS, USE FOR INSULIN INJECTIONS OR TO FINGER STICK AS DIRECTED, Disp: , Rfl:    allopurinol (ZYLOPRIM) 300 MG tablet, Take 1 tablet (300 mg total) by mouth daily., Disp: 90 tablet, Rfl: 3   amLODipine (NORVASC) 5 MG tablet, Take 1 tablet (5 mg total) by mouth daily., Disp: 90 tablet, Rfl: 3   aspirin 81 MG tablet, Take 81 mg by mouth daily., Disp: , Rfl:    azelastine (OPTIVAR) 0.05 % ophthalmic solution, Place 1 drop into both eyes 2 (two) times daily., Disp: 6 mL, Rfl: 12   calcium carbonate (OS-CAL) 600 MG TABS  tablet, Take 600 mg by mouth daily., Disp: , Rfl:    carvedilol (COREG) 6.25 MG tablet, Take 1 tablet (6.25 mg total) by mouth 2 (two) times daily with a meal., Disp: 180 tablet, Rfl: 3   cetirizine (ZYRTEC) 10 MG tablet, Take 1 tablet (10 mg total) by mouth daily., Disp: 90 tablet, Rfl: 3   Cholecalciferol 50 MCG (2000 UT) TABS, 1 tab by mouth once daily, Disp: 30 tablet, Rfl: 99   Continuous Blood Gluc Receiver (FREESTYLE LIBRE 14 DAY READER) DEVI, 1 Device by Does not apply route 4 (four)  times daily. E11.9, Disp: 1 Device, Rfl: 0   Continuous Blood Gluc Sensor (FREESTYLE LIBRE SENSOR SYSTEM) MISC, 1 Device by Does not apply route every 14 (fourteen) days. E11.9, Disp: 6 each, Rfl: 3   furosemide (LASIX) 40 MG tablet, Take 1 tablet (40 mg total) by mouth daily as needed for edema., Disp: 90 tablet, Rfl: 3   gabapentin (NEURONTIN) 600 MG tablet, 1 tab by mouth twice per day and 2 tabs at bedtime, Disp: 360 tablet, Rfl: 1   insulin glargine (LANTUS SOLOSTAR) 100 UNIT/ML Solostar Pen, Inject 60 Units into the skin daily at 10 pm., Disp: 15 mL, Rfl: 5   Insulin Pen Needle (BD PEN NEEDLE NANO U/F) 32G X 4 MM MISC, 1 with pen use daily, Disp: 100 each, Rfl: 3   metFORMIN (GLUCOPHAGE) 1000 MG tablet, Take 1 tablet (1,000 mg total) by mouth 2 (two) times daily with a meal., Disp: 180 tablet, Rfl: 3   nitroGLYCERIN (NITROSTAT) 0.4 MG SL tablet, Place 1 tablet (0.4 mg total) under the tongue every 5 (five) minutes as needed for chest pain., Disp: 90 tablet, Rfl: 3   Omega-3 Fatty Acids (FISH OIL) 1000 MG CAPS, TAKE TWO CAPSULES BY MOUTH DAILY ELEVATED TRIGLYCERIDES, Disp: , Rfl:    omeprazole (PRILOSEC) 20 MG capsule, Take 1 capsule (20 mg total) by mouth 2 (two) times daily before a meal., Disp: 180 capsule, Rfl: 3   PARoxetine (PAXIL) 40 MG tablet, Take 0.5 tablets (20 mg total) by mouth daily., Disp: 90 tablet, Rfl: 3   potassium chloride (KLOR-CON) 8 MEQ tablet, Take 1 tablet (8 mEq total) by mouth daily., Disp: 90 tablet, Rfl: 3   rOPINIRole (REQUIP) 3 MG tablet, Take 3 tablets (9 mg total) by mouth at bedtime., Disp: 90 tablet, Rfl: 3   rosuvastatin (CRESTOR) 10 MG tablet, Take 1 tablet (10 mg total) by mouth daily., Disp: 90 tablet, Rfl: 3   Semaglutide,0.25 or 0.'5MG'$ /DOS, (OZEMPIC, 0.25 OR 0.5 MG/DOSE,) 2 MG/3ML SOPN, Take 0.5 mg subq once weekly, Disp: 9 mL, Rfl: 3   tamsulosin (FLOMAX) 0.4 MG CAPS capsule, TAKE ONE CAPSULE BY MOUTH ONCE A DAY FOR URINATION AND PROSTATE, Disp: 90  capsule, Rfl: 3   traZODone (DESYREL) 50 MG tablet, Take 1 tablet (50 mg total) by mouth at bedtime as needed for sleep., Disp: 90 tablet, Rfl: 1   triamcinolone (NASACORT) 55 MCG/ACT AERO nasal inhaler, Place 2 sprays into the nose daily., Disp: 3 each, Rfl: 3   Objective:     Vitals:   01/18/22 1314  BP: 134/82  Pulse: 65  SpO2: 95%  Weight: 258 lb (117 kg)  Height: '5\' 10"'$  (1.778 m)      Body mass index is 37.02 kg/m.    Physical Exam:    Gen: Appears well, nad, nontoxic and pleasant Psych: Alert and oriented, appropriate mood and affect Neuro: sensation intact, strength is 5/5 in upper  and lower extremities, muscle tone wnl Skin: no susupicious lesions or rashes  Back - Normal skin, Spine with normal alignment and no deformity.   No tenderness to vertebral process palpation.   Paraspinous muscles are not tender and without spasm Straight leg raise negative Trendelenberg negative  Decreased cervical and lumbar ROM in all directions  Electronically signed by:  Benito Mccreedy D.[facility] 1:57 PM 01/18/22

## 2022-01-18 NOTE — Patient Instructions (Addendum)
Good to see you Tylenol [phone] mg 2-3 times a day for pain relief  Pt referral  Neck and low back HEP  3-4 week follow up

## 2022-01-18 NOTE — Assessment & Plan Note (Signed)
BP Readings from Last 3 Encounters:  01/18/22 134/82  01/18/22 134/82  08/18/21 140/76   Stable, pt to continue medical treatment norvasc 5 mg qd, coreg 6.25 mg bid

## 2022-01-18 NOTE — Assessment & Plan Note (Signed)
For add zyrtec and nasacort, and optivar asd,  to f/u any worsening symptoms or concerns

## 2022-01-18 NOTE — Assessment & Plan Note (Signed)
Last vitamin D Lab Results  Component Value Date   VD25OH 43.41 01/18/2022   Stable, cont oral replacement

## 2022-01-18 NOTE — Assessment & Plan Note (Signed)
Lab Results  Component Value Date   HGBA1C 8.8 (H) 01/18/2022   uncontrolled, pt to continue current medical treatment lantus 60 u qd, metformin 1000 bid, and add ozempic 0.5 mg weekly

## 2022-01-18 NOTE — Assessment & Plan Note (Signed)
Midline and left, with recent worsening - for sport med referral

## 2022-01-18 NOTE — Assessment & Plan Note (Signed)
With recent worsening pain non radicular - for sport med referral

## 2022-01-18 NOTE — Progress Notes (Signed)
Patient ID: Aaron Maldonado, male   DOB: 20-Apr-1946, 76 y.o.   MRN: [id number]        Chief Complaint: follow up dm, low vit d, cough, allergies, htn, neck and lower back pain       HPI:  Aaron Maldonado is a 76 y.o. male here with c/o productive cough yellowish green for several wks just not resolving, and wife with him is concerned.  No high fever, chills, and Pt denies chest pain, increased sob or doe, wheezing, orthopnea, PND, increased LE swelling, palpitations, dizziness or syncope.  Pt denies polydipsia, polyuria, or new focal neuro s/s.    Pt denies fever, wt loss, night sweats, loss of appetite, or other constitutional symptoms  Ntw taking Vit D.  Does have several wks ongoing nasal allergy symptoms with clearish congestion, itch and sneezing, without fever, pain, ST, cough, swelling or wheezing.  BP is controlled at home.  Does also have 3 mo overall worsening neck and lower back pain localized to those areas, without significant radicular symptoms.         Wt Readings from Last 3 Encounters:  01/18/22 258 lb (117 kg)  01/18/22 258 lb (117 kg)  08/18/21 257 lb (116.6 kg)   BP Readings from Last 3 Encounters:  01/18/22 134/82  01/18/22 134/82  08/18/21 140/76         Past Medical History:  Diagnosis Date   Abnormal nuclear stress test 12/29/2013   Allergy    Arthritis    Chest pain    CHF (congestive heart failure) (HCC)    Chicken pox    Colon polyps    Coronary artery disease    Depression    Diabetes mellitus without complication (HCC)    Diverticulitis    Dyspnea on exertion    GERD (gastroesophageal reflux disease)    Heart murmur    History of heart surgery    Hyperlipidemia    Hypertension    Stroke [facility])    Past Surgical History:  Procedure Laterality Date   COLONOSCOPY     KNEE SURGERY     LEFT HEART CATHETERIZATION WITH CORONARY ANGIOGRAM N/A 01/06/2014   Procedure: LEFT HEART CATHETERIZATION WITH CORONARY ANGIOGRAM;  Surgeon: Lorretta Harp, MD;   Location: [facility] CATH LAB;  Service: Cardiovascular;  Laterality: N/A;   SHOULDER SURGERY     TONSILLECTOMY      reports that he quit smoking about 33 years ago. His smoking use included cigarettes. He has a 64.50 pack-year smoking history. He has never used smokeless tobacco. He reports that he does not drink alcohol and does not use drugs. family history includes Alzheimer's disease in his father; Breast cancer in his sister; Heart disease in his mother; Heart failure in his mother; Hyperlipidemia in his maternal grandfather and maternal grandmother; Hypertension in his maternal grandfather, maternal grandmother, paternal grandfather, and paternal grandmother; 38 / [place] in his maternal grandfather and maternal grandmother; Mitral valve prolapse in his mother; SIDS in his daughter. Allergies  Allergen Reactions   Losartan     Other reaction(s): Angioedema (ALLERGY/intolerance)   Atorvastatin Other (See Comments)    Other reaction(s): Muscle pain   Keflex [Cephalexin]     unknown   Lisinopril Cough   Penicillins Rash    Has patient had a PCN reaction causing immediate rash, facial/tongue/throat swelling, SOB or lightheadedness with hypotension: No Has patient had a PCN reaction causing severe rash involving mucus membranes or skin necrosis: No Has patient had a  PCN reaction that required hospitalization: No Has patient had a PCN reaction occurring within the last 10 years: No If all of the above answers are "NO", then may proceed with Cephalosporin use.    Sulfa Antibiotics Rash    rash   Sulfasalazine Rash    rash   Current Outpatient Medications on File Prior to Visit  Medication Sig Dispense Refill   acetaminophen (TYLENOL) 500 MG tablet TAKE ONE TABLET BY MOUTH THREE TIMES A DAY AS NEEDED FOR MILD TO MODERATE PAIN, FEVER, BODY ACHES, HEADACHE     Alcohol Swabs (ALCOHOL WIPES) 70 % PADS USE FOR INSULIN INJECTIONS OR TO FINGER STICK AS DIRECTED     allopurinol (ZYLOPRIM)  300 MG tablet Take 1 tablet (300 mg total) by mouth daily. 90 tablet 3   amLODipine (NORVASC) 5 MG tablet Take 1 tablet (5 mg total) by mouth daily. 90 tablet 3   aspirin 81 MG tablet Take 81 mg by mouth daily.     calcium carbonate (OS-CAL) 600 MG TABS tablet Take 600 mg by mouth daily.     carvedilol (COREG) 6.25 MG tablet Take 1 tablet (6.25 mg total) by mouth 2 (two) times daily with a meal. 180 tablet 3   Cholecalciferol 50 MCG (2000 UT) TABS 1 tab by mouth once daily 30 tablet 99   Continuous Blood Gluc Receiver (FREESTYLE LIBRE 14 DAY READER) DEVI 1 Device by Does not apply route 4 (four) times daily. E11.9 1 Device 0   Continuous Blood Gluc Sensor (FREESTYLE LIBRE SENSOR SYSTEM) MISC 1 Device by Does not apply route every 14 (fourteen) days. E11.9 6 each 3   furosemide (LASIX) 40 MG tablet Take 1 tablet (40 mg total) by mouth daily as needed for edema. 90 tablet 3   gabapentin (NEURONTIN) 600 MG tablet 1 tab by mouth twice per day and 2 tabs at bedtime 360 tablet 1   insulin glargine (LANTUS SOLOSTAR) 100 UNIT/ML Solostar Pen Inject 60 Units into the skin daily at 10 pm. 15 mL 5   Insulin Pen Needle (BD PEN NEEDLE NANO U/F) 32G X 4 MM MISC 1 with pen use daily 100 each 3   metFORMIN (GLUCOPHAGE) 1000 MG tablet Take 1 tablet (1,000 mg total) by mouth 2 (two) times daily with a meal. 180 tablet 3   nitroGLYCERIN (NITROSTAT) 0.4 MG SL tablet Place 1 tablet (0.4 mg total) under the tongue every 5 (five) minutes as needed for chest pain. 90 tablet 3   Omega-3 Fatty Acids (FISH OIL) 1000 MG CAPS TAKE TWO CAPSULES BY MOUTH DAILY ELEVATED TRIGLYCERIDES     omeprazole (PRILOSEC) 20 MG capsule Take 1 capsule (20 mg total) by mouth 2 (two) times daily before a meal. 180 capsule 3   PARoxetine (PAXIL) 40 MG tablet Take 0.5 tablets (20 mg total) by mouth daily. 90 tablet 3   potassium chloride (KLOR-CON) 8 MEQ tablet Take 1 tablet (8 mEq total) by mouth daily. 90 tablet 3   rOPINIRole (REQUIP) 3 MG  tablet Take 3 tablets (9 mg total) by mouth at bedtime. 90 tablet 3   rosuvastatin (CRESTOR) 10 MG tablet Take 1 tablet (10 mg total) by mouth daily. 90 tablet 3   tamsulosin (FLOMAX) 0.4 MG CAPS capsule TAKE ONE CAPSULE BY MOUTH ONCE A DAY FOR URINATION AND PROSTATE 90 capsule 3   traZODone (DESYREL) 50 MG tablet Take 1 tablet (50 mg total) by mouth at bedtime as needed for sleep. 90 tablet 1   No  current facility-administered medications on file prior to visit.        ROS:  All others reviewed and negative.  Objective        PE:  BP 134/82 (BP Location: Right Arm, Patient Position: Sitting, Cuff Size: Large)   Pulse (!) 59   Temp (!) 97.4 F (36.3 C) (Oral)   Ht '5\' 10"'$  (1.778 m)   Wt 258 lb (117 kg)   SpO2 96%   BMI 37.02 kg/m                 Constitutional: Pt appears in NAD but fatigued, slowed in movements, non toxic               HENT: Head: NCAT.                Right Ear: External ear normal.                 Left Ear: External ear normal.  Bilat tm's with mild erythema.  Max sinus areas non tender.  Pharynx with mild erythema, no exudate               Eyes: . Pupils are equal, round, and reactive to light. Conjunctivae with weepy clear d/c and EOM are normal               Nose: without d/c or deformity               Neck: Neck supple. Gross normal ROM               Cardiovascular: Normal rate and regular rhythm.                 Pulmonary/Chest: Effort normal and breath sounds decreased without rales or wheezing.                Abd:  Soft, NT, ND, + BS, no organomegaly               Neurological: Pt is alert. At baseline orientation, motor grossly intact               Skin: Skin is warm. No rashes, no other new lesions, LE edema - trace bilateral               Psychiatric: Pt behavior is normal without agitation   Micro: none  Cardiac tracings I have personally interpreted today:  none  Pertinent Radiological findings (summarize): none   Lab Results  Component Value  Date   WBC 10.0 01/18/2022   HGB 14.0 01/18/2022   HCT 41.2 01/18/2022   PLT 144.0 (L) 01/18/2022   GLUCOSE 214 (H) 01/18/2022   CHOL 87 01/18/2022   TRIG 307.0 (H) 01/18/2022   HDL 25.90 (L) 01/18/2022   LDLDIRECT 37.0 01/18/2022   LDLCALC 40 02/17/2021   ALT 18 01/18/2022   AST 16 01/18/2022   NA 138 01/18/2022   K 3.8 01/18/2022   CL 100 01/18/2022   CREATININE 1.37 01/18/2022   BUN 19 01/18/2022   CO2 31 01/18/2022   TSH 3.23 08/18/2021   PSA 1.77 08/18/2021   INR 0.93 12/29/2013   HGBA1C 8.8 (H) 01/18/2022   MICROALBUR 7.2 (H) 08/18/2021   Assessment/Plan:  Aaron Maldonado is a 76 y.o. White or Caucasian [1] male with  has a past medical history of Abnormal nuclear stress test (12/29/2013), Allergy, Arthritis, Chest pain, CHF (congestive heart failure) ([place]), Chicken pox, Colon polyps, Coronary artery disease, Depression,  Diabetes mellitus without complication ([place]), Diverticulitis, Dyspnea on exertion, GERD (gastroesophageal reflux disease), Heart murmur, History of heart surgery, Hyperlipidemia, Hypertension, and Stroke ([place]).  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 - for empiric albuterol hfa prn as well  Vitamin D deficiency Last vitamin D Lab Results  Component Value Date   VD25OH 43.41 01/18/2022   Stable, cont oral replacement   Neck pain on left side Midline and left, with recent worsening - for sport med referral  Chronic midline low back pain without sciatica With recent worsening pain non radicular - for sport med referral  Allergic rhinoconjunctivitis For add zyrtec and nasacort, and optivar asd,  to f/u any worsening symptoms or concerns  Essential hypertension BP Readings from Last 3  Encounters:  01/18/22 134/82  01/18/22 134/82  08/18/21 140/76   Stable, pt to continue medical treatment norvasc 5 mg qd, coreg 6.25 mg bid  Followup: Return if symptoms worsen or fail to improve.  Cathlean Cower, MD 01/18/2022 8:06 PM [facility]

## 2022-01-18 NOTE — Assessment & Plan Note (Signed)
Cant r/o underlying copd - for empiric albuterol hfa prn as well

## 2022-01-18 NOTE — Patient Instructions (Signed)
Please take all new medication as prescribed  - the ozempic for sugar and wt loss, albuterol inhaler for lungs, zyrtec and nasacort and optivar for allergies  Please continue all other medications as before, and refills have been done if requested.  Please have the pharmacy call with any other refills you may need.  Please continue your efforts at being more active, low cholesterol diet, and weight control.  You are otherwise up to date with prevention measures today.  Please keep your appointments with your specialists as you may have planned  You will be contacted regarding the referral for: Sports medicine  Please go to the XRAY Department in the first floor for the x-ray testing  Please go to the LAB at the blood drawing area for the tests to be done  You will be contacted by phone if any changes need to be made immediately.  Otherwise, you will receive a letter about your results with an explanation, but please check with MyChart first.  Please remember to sign up for MyChart if you have not done so, as this will be important to you in the future with finding out test results, communicating by private email, and scheduling acute appointments online when needed.

## 2022-01-18 NOTE — Assessment & Plan Note (Signed)
Etiology unclear, cant r/o pna- for cxr

## 2022-01-24 ENCOUNTER — Telehealth: Payer: Self-pay

## 2022-01-24 NOTE — Telephone Encounter (Signed)
Not sure what to say but is most likely bacterial, so please take all antibx as rx.

## 2022-01-24 NOTE — Telephone Encounter (Signed)
Patients daughter called and states that the patient would like to know what type of lower left lung infection he has as he is very concerned.

## 2022-01-25 ENCOUNTER — Encounter: Payer: Self-pay | Admitting: *Deleted

## 2022-01-25 ENCOUNTER — Ambulatory Visit (INDEPENDENT_AMBULATORY_CARE_PROVIDER_SITE_OTHER): Payer: Medicare Other | Admitting: *Deleted

## 2022-01-25 VITALS — Ht 70.0 in | Wt 253.0 lb

## 2022-01-25 DIAGNOSIS — Z0000 Encounter for general adult medical examination without abnormal findings: Secondary | ICD-10-CM | POA: Diagnosis not present

## 2022-01-25 NOTE — Progress Notes (Signed)
Subjective:   Aaron Maldonado is a 76 y.o. male who presents for Medicare Annual/Subsequent preventive examination. I connected with  Aaron Maldonado on 01/25/22 by a audio enabled telemedicine application and verified that I am speaking with the correct person using two identifiers.  Patient Location: Home  Provider Location: Office/Clinic  I discussed the limitations of evaluation and management by telemedicine. The patient expressed understanding and agreed to proceed.   Review of Systems    Defer to PCP  See problem list for additional risk factors        Objective:    Today's Vitals   01/25/22 1400  Weight: 253 lb (114.8 kg)  Height: '5\' 10"'$  (1.778 m)   Body mass index is 36.3 kg/m.     01/20/2021    8:46 AM 11/12/2018    7:40 AM 04/20/2017    9:07 AM 11/01/2016    6:21 PM 12/30/2015   11:32 AM 06/04/2015    5:51 PM 03/26/2015    8:57 AM  Advanced Directives  Does Patient Have a Medical Advance Directive? No No No No No No No  Would patient like information on creating a medical advance directive? No - Patient declined No - Patient declined No - Patient declined No - Patient declined   No - patient declined information    Current Medications (verified) Outpatient Encounter Medications as of 01/25/2022  Medication Sig   acetaminophen (TYLENOL) 500 MG tablet TAKE ONE TABLET BY MOUTH THREE TIMES A DAY AS NEEDED FOR MILD TO MODERATE PAIN, FEVER, BODY ACHES, HEADACHE   albuterol (VENTOLIN HFA) 108 (90 Base) MCG/ACT inhaler Inhale 2 puffs into the lungs every 6 (six) hours as needed for wheezing or shortness of breath.   Alcohol Swabs (ALCOHOL WIPES) 70 % PADS USE FOR INSULIN INJECTIONS OR TO FINGER STICK AS DIRECTED   allopurinol (ZYLOPRIM) 300 MG tablet Take 1 tablet (300 mg total) by mouth daily.   amLODipine (NORVASC) 5 MG tablet Take 1 tablet (5 mg total) by mouth daily.   aspirin 81 MG tablet Take 81 mg by mouth daily.   azelastine (OPTIVAR) 0.05 % ophthalmic  solution Place 1 drop into both eyes 2 (two) times daily.   calcium carbonate (OS-CAL) 600 MG TABS tablet Take 600 mg by mouth daily.   carvedilol (COREG) 6.25 MG tablet Take 1 tablet (6.25 mg total) by mouth 2 (two) times daily with a meal.   Continuous Blood Gluc Receiver (FREESTYLE LIBRE 14 DAY READER) DEVI 1 Device by Does not apply route 4 (four) times daily. E11.9   Continuous Blood Gluc Sensor (FREESTYLE LIBRE SENSOR SYSTEM) MISC 1 Device by Does not apply route every 14 (fourteen) days. E11.9   furosemide (LASIX) 40 MG tablet Take 1 tablet (40 mg total) by mouth daily as needed for edema.   gabapentin (NEURONTIN) 600 MG tablet 1 tab by mouth twice per day and 2 tabs at bedtime   insulin glargine (LANTUS SOLOSTAR) 100 UNIT/ML Solostar Pen Inject 60 Units into the skin daily at 10 pm.   Insulin Pen Needle (BD PEN NEEDLE NANO U/F) 32G X 4 MM MISC 1 with pen use daily   levofloxacin (LEVAQUIN) 500 MG tablet Take 1 tablet (500 mg total) by mouth daily for 10 days.   metFORMIN (GLUCOPHAGE) 1000 MG tablet Take 1 tablet (1,000 mg total) by mouth 2 (two) times daily with a meal.   Omega-3 Fatty Acids (FISH OIL) 1000 MG CAPS TAKE TWO CAPSULES BY MOUTH DAILY ELEVATED  TRIGLYCERIDES   omeprazole (PRILOSEC) 20 MG capsule Take 1 capsule (20 mg total) by mouth 2 (two) times daily before a meal.   PARoxetine (PAXIL) 40 MG tablet Take 0.5 tablets (20 mg total) by mouth daily.   potassium chloride (KLOR-CON) 8 MEQ tablet Take 1 tablet (8 mEq total) by mouth daily.   rOPINIRole (REQUIP) 3 MG tablet Take 3 tablets (9 mg total) by mouth at bedtime.   rosuvastatin (CRESTOR) 10 MG tablet Take 1 tablet (10 mg total) by mouth daily.   Semaglutide,0.25 or 0.'5MG'$ /DOS, (OZEMPIC, 0.25 OR 0.5 MG/DOSE,) 2 MG/3ML SOPN Take 0.5 mg subq once weekly   tamsulosin (FLOMAX) 0.4 MG CAPS capsule TAKE ONE CAPSULE BY MOUTH ONCE A DAY FOR URINATION AND PROSTATE   traZODone (DESYREL) 50 MG tablet Take 1 tablet (50 mg total) by mouth  at bedtime as needed for sleep.   triamcinolone (NASACORT) 55 MCG/ACT AERO nasal inhaler Place 2 sprays into the nose daily.   cetirizine (ZYRTEC) 10 MG tablet Take 1 tablet (10 mg total) by mouth daily. (Patient not taking: Reported on 01/25/2022)   Cholecalciferol 50 MCG (2000 UT) TABS 1 tab by mouth once daily   nitroGLYCERIN (NITROSTAT) 0.4 MG SL tablet Place 1 tablet (0.4 mg total) under the tongue every 5 (five) minutes as needed for chest pain. (Patient not taking: Reported on 01/25/2022)   No facility-administered encounter medications on file as of 01/25/2022.    Allergies (verified) Losartan, Atorvastatin, Keflex [cephalexin], Lisinopril, Penicillins, Sulfa antibiotics, and Sulfasalazine   History: Past Medical History:  Diagnosis Date   Abnormal nuclear stress test 12/29/2013   Allergy    Arthritis    Chest pain    CHF (congestive heart failure) (HCC)    Chicken pox    Colon polyps    Coronary artery disease    Depression    Diabetes mellitus without complication (HCC)    Diverticulitis    Dyspnea on exertion    GERD (gastroesophageal reflux disease)    Heart murmur    History of heart surgery    Hyperlipidemia    Hypertension    Stroke [facility])    Past Surgical History:  Procedure Laterality Date   COLONOSCOPY     KNEE SURGERY     LEFT HEART CATHETERIZATION WITH CORONARY ANGIOGRAM N/A 01/06/2014   Procedure: LEFT HEART CATHETERIZATION WITH CORONARY ANGIOGRAM;  Surgeon: Lorretta Harp, MD;  Location: [facility] CATH LAB;  Service: Cardiovascular;  Laterality: N/A;   SHOULDER SURGERY     TONSILLECTOMY     Family History  Problem Relation Age of Onset   Heart failure Mother    Heart disease Mother    Mitral valve prolapse Mother        rheumatic fever, MVReplacement   Alzheimer's disease Father    Breast cancer Sister        and sacrum cancer   SIDS Daughter    Hyperlipidemia Maternal Grandmother    Miscarriages / Stillbirths Maternal Grandmother    Hypertension  Maternal Grandmother    Hyperlipidemia Maternal Grandfather    Miscarriages / Stillbirths Maternal Grandfather    Hypertension Maternal Grandfather    Hypertension Paternal Grandmother    Hypertension Paternal Grandfather    Colon cancer Neg Hx    Esophageal cancer Neg Hx    Stomach cancer Neg Hx    Rectal cancer Neg Hx    Social History   Socioeconomic History   Marital status: Divorced    Spouse name: Not on file  Number of children: 3   Years of education: 9   Highest education level: Not on file  Occupational History   Occupation: retired    Comment: former Administrator  Tobacco Use   Smoking status: Former    Packs/day: 1.50    Years: 43.00    Total pack years: 64.50    Types: Cigarettes    Quit date: 05/22/1988    Years since quitting: 33.7   Smokeless tobacco: Never  Vaping Use   Vaping Use: Never used  Substance and Sexual Activity   Alcohol use: No   Drug use: No   Sexual activity: Not Currently  Other Topics Concern   Not on file  Social History Narrative   Fun/Hobby: Work    Denies abuse and feels safe at home.    Social Determinants of Health   Financial Resource Strain: Low Risk  (01/25/2022)   Overall Financial Resource Strain (CARDIA)    Difficulty of Paying Living Expenses: Not hard at all  Food Insecurity: No Food Insecurity (01/25/2022)   Hunger Vital Sign    Worried About Running Out of Food in the Last Year: Never true    Ran Out of Food in the Last Year: Never true  Transportation Needs: No Transportation Needs (01/25/2022)   PRAPARE - Hydrologist (Medical): No    Lack of Transportation (Non-Medical): No  Physical Activity: Inactive (01/25/2022)   Exercise Vital Sign    Days of Exercise per Week: 0 days    Minutes of Exercise per Session: 0 min  Stress: No Stress Concern Present (01/25/2022)   [place]    Feeling of Stress : Not at all  Social  Connections: Socially Isolated (01/25/2022)   Social Connection and Isolation Panel [NHANES]    Frequency of Communication with Friends and Family: Three times a week    Frequency of Social Gatherings with Friends and Family: Twice a week    Attends Religious Services: Never    Marine scientist or Organizations: No    Attends Music therapist: Never    Marital Status: Divorced    Tobacco Counseling Counseling given: Not Answered   Clinical Intake:  Pre-visit preparation completed: Yes  Pain : No/denies pain     Nutritional Status: BMI > 30  Obese Nutritional Risks: None Diabetes: Yes CBG done?: No  How often do you need to have someone help you when you read instructions, pamphlets, or other written materials from your doctor or pharmacy?: 1 - Never What is the last grade level you completed in school?: 9th grade  Diabetic?Nutrition Risk Assessment:  Has the patient had any N/V/D within the last 2 months?  No  Does the patient have any non-healing wounds?  No  Has the patient had any unintentional weight loss or weight gain?  No   Diabetes:  Is the patient diabetic?  Yes  If diabetic, was a CBG obtained today?  No  Did the patient bring in their glucometer from home?  No  How often do you monitor your CBG's? Patient does not check his blood glucose.   Financial Strains and Diabetes Management:  Are you having any financial strains with the device, your supplies or your medication? No .  Does the patient want to be seen by Chronic Care Management for management of their diabetes?  No  Would the patient like to be referred to a Nutritionist or for  Diabetic Management?  No   Diabetic Exams:  Diabetic Eye Exam: Completed 07/11/2021 Diabetic Foot Exam: Overdue, Pt has been advised about the importance in completing this exam. Pt is scheduled for diabetic foot exam on at next visit.   Interpreter Needed?: No      Activities of Daily Living     01/25/2022    2:12 PM  In your present state of health, do you have any difficulty performing the following activities:  Hearing? 0  Vision? 0  Difficulty concentrating or making decisions? 0  Walking or climbing stairs? 0  Dressing or bathing? 0  Doing errands, shopping? 0    Patient Care Team: Biagio Borg, MD as PCP - General (Internal Medicine)  Indicate any recent Medical Services you may have received from other than [facility] providers in the past year (date may be approximate).     Assessment:   This is a routine wellness examination for Aaron Maldonado.  Hearing/Vision screen No results found.  Dietary issues and exercise activities discussed: Current Exercise Habits: The patient does not participate in regular exercise at present, Exercise limited by: respiratory conditions(s)   Goals Addressed   None   Depression Screen    01/25/2022    2:06 PM 01/18/2022    9:40 AM 08/18/2021    1:51 PM 08/18/2021    1:35 PM 02/17/2021    2:05 PM 01/20/2021    8:56 AM 08/19/2020    1:54 PM  PHQ 2/9 Scores  PHQ - 2 Score 0  0 0 0 0 0  Exception Documentation  Patient refusal         Fall Risk    01/25/2022    2:06 PM 01/18/2022    9:40 AM 08/18/2021    1:50 PM 08/18/2021    1:35 PM 02/17/2021    2:04 PM  [place] in the past year? 0 0 0 1 1  Number falls in past yr: 0  0 0 0  Injury with Fall? 0  0 1 1  Risk for fall due to : No Fall Risks No Fall Risks     Follow up Falls evaluation completed Falls evaluation completed       [place]:  Any stairs in or around the home? Yes  If so, are there any without handrails? No  Home free of loose throw rugs in walkways, pet beds, electrical cords, etc? Yes  Adequate lighting in your home to reduce risk of falls? Yes   ASSISTIVE DEVICES UTILIZED TO PREVENT FALLS:  Life alert? No  Use of a cane, walker or w/c? No  Grab bars in the bathroom? Yes  Shower chair or bench in shower? No  Elevated toilet  seat or a handicapped toilet? No   TIMED UP AND GO:  Was the test performed? No .  Length of time to ambulate 10 feet: n/a sec.     Cognitive Function:        01/25/2022    2:13 PM  6CIT Screen  What Year? 0 points  What month? 0 points  What time? 0 points  Count back from 20 0 points  Months in reverse 2 points  Repeat phrase 2 points  Total Score 4 points    Immunizations Immunization History  Administered Date(s) Administered   Fluad Quad(high Dose 65+) 02/01/2019, 04/08/2020, 02/17/2021   Influenza, High Dose Seasonal PF 02/16/2014, 02/21/2016, 02/19/2017, 03/06/2017   Influenza, Seasonal, Injecte, Preservative Fre 04/11/2015  Influenza-Unspecified 03/25/2012, 04/15/2013, 03/13/2017, 01/20/2018, 03/18/2018, 02/04/2019, 02/17/2021   Moderna Covid-19 Vaccine Bivalent Booster 25yr & up 04/21/2021   Moderna Sars-Covid-2 Vaccination 07/22/2019, 08/20/2019, 05/05/2020   Pneumococcal Conjugate-13 02/16/2014, 03/23/2016   Pneumococcal Polysaccharide-23 11/07/2010, 01/10/2013, 09/21/2017   Td (Adult),unspecified 05/22/2009   Tdap 05/22/2009, 01/10/2013, 03/23/2017   Tetanus 10/25/2000, 05/22/2009   Zoster Recombinat (Shingrix) 07/27/2020, 05/24/2021    TDAP status: Up to date  Flu Vaccine status: Due, Education has been provided regarding the importance of this vaccine. Advised may receive this vaccine at local pharmacy or Health Dept. Aware to provide a copy of the vaccination record if obtained from local pharmacy or Health Dept. Verbalized acceptance and understanding.  Pneumococcal vaccine status: Up to date  Covid-19 vaccine status: Information provided on how to obtain vaccines.   Qualifies for Shingles Vaccine? Yes   Zostavax completed Yes   Shingrix Completed?: Yes  Screening Tests Health Maintenance  Topic Date Due   FOOT EXAM  08/19/2021   COVID-19 Vaccine (5 - Moderna series) 08/20/2021   INFLUENZA VACCINE  08/20/2022 (Originally 12/20/2021)    OPHTHALMOLOGY EXAM  07/11/2022   HEMOGLOBIN A1C  07/20/2022   Diabetic kidney evaluation - Urine ACR  08/19/2022   Diabetic kidney evaluation - GFR measurement  01/19/2023   COLONOSCOPY (Pts 45-473yrInsurance coverage will need to be confirmed)  11/11/2025   TETANUS/TDAP  03/24/2027   Pneumonia Vaccine 6571Years old  Completed   Hepatitis C Screening  Completed   Zoster Vaccines- Shingrix  Completed   HPV VACCINES  Aged Out    Health Maintenance  Health Maintenance Due  Topic Date Due   FOOT EXAM  08/19/2021   COVID-19 Vaccine (5 - Moderna series) 08/20/2021    Colorectal cancer screening: Type of screening: Colonoscopy. Completed 11/12/2018. Repeat every 7 years  Lung Cancer Screening: (Low Dose CT Chest recommended if Age 77-80ears, 30 pack-year currently smoking OR have quit w/in 15years.) does not qualify.   Lung Cancer Screening Referral: n/a  Additional Screening:  Hepatitis C Screening does qualify; Completed 03/23/2017  Vision Screening: Recommended annual ophthalmology exams for early detection of glaucoma and other disorders of the eye. Is the patient up to date with their annual eye exam?  Yes  Who is the provider or what is the name of the office in which the patient attends annual eye exams? Dr. MaChinita Greenlandf pt is not established with a provider, would they like to be referred to a provider to establish care? No .   Dental Screening: Recommended annual dental exams for proper oral hygiene  Community Resource Referral / Chronic Care Management: CRR required this visit?  No   CCM required this visit?  No      Plan:     I have personally reviewed and noted the following in the patient's chart:   Medical and social history Use of alcohol, tobacco or illicit drugs  Current medications and supplements including opioid prescriptions. Patient is not currently taking opioid prescriptions. Functional ability and status Nutritional status Physical  activity Advanced directives List of other physicians Hospitalizations, surgeries, and ER visits in previous 12 months Vitals Screenings to include cognitive, depression, and falls Referrals and appointments  In addition, I have reviewed and discussed with patient certain preventive protocols, quality metrics, and best practice recommendations. A written personalized care plan for preventive services as well as general preventive health recommendations were provided to patient.     MaMylinda LatinaCMWilliamson 01/25/2022 non face to  face 30 minutes   Nurse Notes:  Aaron Maldonado , Thank you for taking time to come for your Medicare Wellness Visit. I appreciate your ongoing commitment to your health goals. Please review the following plan we discussed and let me know if I can assist you in the future.   These are the goals we discussed:  Goals   None     This is a list of the screening recommended for you and due dates:  Health Maintenance  Topic Date Due   Complete foot exam   08/19/2021   COVID-19 Vaccine (5 - Moderna series) 08/20/2021   Flu Shot  08/20/2022*   Eye exam for diabetics  07/11/2022   Hemoglobin A1C  07/20/2022   Yearly kidney health urinalysis for diabetes  08/19/2022   Yearly kidney function blood test for diabetes  01/19/2023   Colon Cancer Screening  11/11/2025   Tetanus Vaccine  03/24/2027   Pneumonia Vaccine  Completed   Hepatitis C Screening: USPSTF Recommendation to screen - Ages 58-79 yo.  Completed   Zoster (Shingles) Vaccine  Completed   HPV Vaccine  Aged Out  *Topic was postponed. The date shown is not the original due date.

## 2022-01-25 NOTE — Patient Instructions (Signed)
Health Maintenance, Male Adopting a healthy lifestyle and getting preventive care are important in promoting health and wellness. Ask your health care provider about: The right schedule for you to have regular tests and exams. Things you can do on your own to prevent diseases and keep yourself healthy. What should I know about diet, weight, and exercise? Eat a healthy diet  Eat a diet that includes plenty of vegetables, fruits, low-fat dairy products, and lean protein. Do not eat a lot of foods that are high in solid fats, added sugars, or sodium. Maintain a healthy weight Body mass index (BMI) is a measurement that can be used to identify possible weight problems. It estimates body fat based on height and weight. Your health care provider can help determine your BMI and help you achieve or maintain a healthy weight. Get regular exercise Get regular exercise. This is one of the most important things you can do for your health. Most adults should: Exercise for at least 150 minutes each week. The exercise should increase your heart rate and make you sweat (moderate-intensity exercise). Do strengthening exercises at least twice a week. This is in addition to the moderate-intensity exercise. Spend less time sitting. Even light physical activity can be beneficial. Watch cholesterol and blood lipids Have your blood tested for lipids and cholesterol at 77 years of age, then have this test every 5 years. You may need to have your cholesterol levels checked more often if: Your lipid or cholesterol levels are high. You are older than 77 years of age. You are at high risk for heart disease. What should I know about cancer screening? Many types of cancers can be detected early and may often be prevented. Depending on your health history and family history, you may need to have cancer screening at various ages. This may include screening for: Colorectal cancer. Prostate cancer. Skin cancer. Lung  cancer. What should I know about heart disease, diabetes, and high blood pressure? Blood pressure and heart disease High blood pressure causes heart disease and increases the risk of stroke. This is more likely to develop in people who have high blood pressure readings or are overweight. Talk with your health care provider about your target blood pressure readings. Have your blood pressure checked: Every 3-5 years if you are 18-39 years of age. Every year if you are 40 years old or older. If you are between the ages of 65 and 75 and are a current or former smoker, ask your health care provider if you should have a one-time screening for abdominal aortic aneurysm (AAA). Diabetes Have regular diabetes screenings. This checks your fasting blood sugar level. Have the screening done: Once every three years after age 45 if you are at a normal weight and have a low risk for diabetes. More often and at a younger age if you are overweight or have a high risk for diabetes. What should I know about preventing infection? Hepatitis B If you have a higher risk for hepatitis B, you should be screened for this virus. Talk with your health care provider to find out if you are at risk for hepatitis B infection. Hepatitis C Blood testing is recommended for: Everyone born from 1945 through 1965. Anyone with known risk factors for hepatitis C. Sexually transmitted infections (STIs) You should be screened each year for STIs, including gonorrhea and chlamydia, if: You are sexually active and are younger than 77 years of age. You are older than 77 years of age and your   health care provider tells you that you are at risk for this type of infection. Your sexual activity has changed since you were last screened, and you are at increased risk for chlamydia or gonorrhea. Ask your health care provider if you are at risk. Ask your health care provider about whether you are at high risk for HIV. Your health care provider  may recommend a prescription medicine to help prevent HIV infection. If you choose to take medicine to prevent HIV, you should first get tested for HIV. You should then be tested every 3 months for as long as you are taking the medicine. Follow these instructions at home: Alcohol use Do not drink alcohol if your health care provider tells you not to drink. If you drink alcohol: Limit how much you have to 0-2 drinks a day. Know how much alcohol is in your drink. In the [place] one drink equals one 12 oz bottle of beer (355 mL), one 5 oz glass of wine (148 mL), or one 1 oz glass of hard liquor (44 mL). Lifestyle Do not use any products that contain nicotine or tobacco. These products include cigarettes, chewing tobacco, and vaping devices, such as e-cigarettes. If you need help quitting, ask your health care provider. Do not use street drugs. Do not share needles. Ask your health care provider for help if you need support or information about quitting drugs. General instructions Schedule regular health, dental, and eye exams. Stay current with your vaccines. Tell your health care provider if: You often feel depressed. You have ever been abused or do not feel safe at home. Summary Adopting a healthy lifestyle and getting preventive care are important in promoting health and wellness. Follow your health care provider's instructions about healthy diet, exercising, and getting tested or screened for diseases. Follow your health care provider's instructions on monitoring your cholesterol and blood pressure. This information is not intended to replace advice given to you by your health care provider. Make sure you discuss any questions you have with your health care provider. Document Revised: 09/27/2020 Document Reviewed: 09/27/2020 Elsevier Patient Education  2023 [employer]  

## 2022-01-30 DIAGNOSIS — M532X2 Spinal instabilities, cervical region: Secondary | ICD-10-CM | POA: Diagnosis not present

## 2022-01-30 DIAGNOSIS — M545 Low back pain, unspecified: Secondary | ICD-10-CM | POA: Diagnosis not present

## 2022-01-30 DIAGNOSIS — M542 Cervicalgia: Secondary | ICD-10-CM | POA: Diagnosis not present

## 2022-02-01 DIAGNOSIS — M532X2 Spinal instabilities, cervical region: Secondary | ICD-10-CM | POA: Diagnosis not present

## 2022-02-01 DIAGNOSIS — M545 Low back pain, unspecified: Secondary | ICD-10-CM | POA: Diagnosis not present

## 2022-02-01 DIAGNOSIS — M542 Cervicalgia: Secondary | ICD-10-CM | POA: Diagnosis not present

## 2022-02-02 NOTE — Progress Notes (Signed)
Aaron Maldonado Phone: [phone]   Assessment and Plan:     1. Neck pain 2. DDD (degenerative disc disease), cervical 3. History of fusion of cervical spine 4. DDD (degenerative disc disease), lumbar 5. Chronic bilateral low back pain without sciatica  6.  Polyarthralgia - Chronic with exacerbation, subsequent visit - Continued chronic pain in multiple joints and throughout entirety of spine, focused primarily over lower cervical spine and lower lumbar spine without radicular symptoms, with history of cervical spine fusion - Due to diffuse and chronic pain, will rule out autoimmune disease with lab work today - Patient felt that physical therapy flared his symptoms more than improve them, so he decided to discontinue. - Continue HEP - Patient has been using ibuprofen 600 to 800 mg 1-3 times a day for years.  Discussed the long-term side effects of chronic NSAID use especially in relation to his mildly decreased GFR.  Recommend discontinuing daily NSAID use - Start Tylenol 500 to 1000 mg tablets 2-3 times a day for day-to-day pain relief -Patient has been receiving relief with Biofreeze.  May continue to use this as needed  Pertinent previous records reviewed include lumbar spine x-ray 01/18/2022, cervical spine x-ray 01/18/2022, CMP including GFR from 02/09/2022   Follow Up: As needed if no improvement or worsening of symptoms.  If we find any significantly abnormal value on today's lab work, I will call and discuss results with patient.   Subjective:   I, Aaron Maldonado, am serving as a Education administrator for Doctor Glennon Mac   Chief Complaint: neck and back pain    HPI:    01/18/22 Patient is a 76 year old male complaining of neck and back pain. Patient states he has been in pain for about 3 weeks, no MOI, pain in his neck to his upper trap and then just his low back all the way across, pain is  mostly when he wakes up in the am , he has pain when he bends over in the sink because of pain, has been taking advil for the pain and that helps he is taking 4 at a time has a hard time getting to sleep has to use melatonin,    02/15/2022 Patient states that he is doing alright thanks to bio freeze    Relevant Historical Information: History of cervical spine fusion, poorly controlled DM type II, PAD, hypertension, CKD,  Additional pertinent review of systems negative.   Current Outpatient Medications:    acetaminophen (TYLENOL) 500 MG tablet, TAKE ONE TABLET BY MOUTH THREE TIMES A DAY AS NEEDED FOR MILD TO MODERATE PAIN, FEVER, BODY ACHES, HEADACHE, Disp: , Rfl:    albuterol (VENTOLIN HFA) 108 (90 Base) MCG/ACT inhaler, Inhale 2 puffs into the lungs every 6 (six) hours as needed for wheezing or shortness of breath., Disp: 24 g, Rfl: 3   Alcohol Swabs (ALCOHOL WIPES) 70 % PADS, USE FOR INSULIN INJECTIONS OR TO FINGER STICK AS DIRECTED, Disp: , Rfl:    allopurinol (ZYLOPRIM) 300 MG tablet, Take 1 tablet (300 mg total) by mouth daily., Disp: 90 tablet, Rfl: 3   amLODipine (NORVASC) 5 MG tablet, Take 1 tablet (5 mg total) by mouth daily., Disp: 90 tablet, Rfl: 3   aspirin 81 MG tablet, Take 81 mg by mouth daily., Disp: , Rfl:    azelastine (OPTIVAR) 0.05 % ophthalmic solution, Place 1 drop into both eyes 2 (two) times daily., Disp: 6  mL, Rfl: 12   calcium carbonate (OS-CAL) 600 MG TABS tablet, Take 600 mg by mouth daily., Disp: , Rfl:    carvedilol (COREG) 6.25 MG tablet, Take 1 tablet (6.25 mg total) by mouth 2 (two) times daily with a meal., Disp: 180 tablet, Rfl: 3   cetirizine (ZYRTEC) 10 MG tablet, Take 1 tablet (10 mg total) by mouth daily., Disp: 90 tablet, Rfl: 3   Cholecalciferol 50 MCG (2000 UT) TABS, 1 tab by mouth once daily, Disp: 30 tablet, Rfl: 99   Continuous Blood Gluc Receiver (FREESTYLE LIBRE 14 DAY READER) DEVI, 1 Device by Does not apply route 4 (four) times daily. E11.9,  Disp: 1 Device, Rfl: 0   Continuous Blood Gluc Sensor (FREESTYLE LIBRE SENSOR SYSTEM) MISC, 1 Device by Does not apply route every 14 (fourteen) days. E11.9, Disp: 6 each, Rfl: 3   furosemide (LASIX) 40 MG tablet, Take 1 tablet (40 mg total) by mouth daily as needed for edema., Disp: 90 tablet, Rfl: 3   gabapentin (NEURONTIN) 600 MG tablet, 1 tab by mouth twice per day and 2 tabs at bedtime, Disp: 360 tablet, Rfl: 1   insulin glargine (LANTUS SOLOSTAR) 100 UNIT/ML Solostar Pen, Inject 60 Units into the skin daily at 10 pm., Disp: 15 mL, Rfl: 5   Insulin Pen Needle (BD PEN NEEDLE NANO U/F) 32G X 4 MM MISC, 1 with pen use daily, Disp: 100 each, Rfl: 3   metFORMIN (GLUCOPHAGE) 1000 MG tablet, Take 1 tablet (1,000 mg total) by mouth 2 (two) times daily with a meal., Disp: 180 tablet, Rfl: 3   nitroGLYCERIN (NITROSTAT) 0.4 MG SL tablet, Place 1 tablet (0.4 mg total) under the tongue every 5 (five) minutes as needed for chest pain., Disp: 90 tablet, Rfl: 3   Omega-3 Fatty Acids (FISH OIL) 1000 MG CAPS, TAKE TWO CAPSULES BY MOUTH DAILY ELEVATED TRIGLYCERIDES, Disp: , Rfl:    omeprazole (PRILOSEC) 20 MG capsule, Take 1 capsule (20 mg total) by mouth 2 (two) times daily before a meal., Disp: 180 capsule, Rfl: 3   PARoxetine (PAXIL) 40 MG tablet, Take 0.5 tablets (20 mg total) by mouth daily., Disp: 90 tablet, Rfl: 3   potassium chloride (KLOR-CON) 8 MEQ tablet, Take 1 tablet (8 mEq total) by mouth daily., Disp: 90 tablet, Rfl: 3   rOPINIRole (REQUIP) 3 MG tablet, Take 3 tablets (9 mg total) by mouth at bedtime., Disp: 90 tablet, Rfl: 3   rosuvastatin (CRESTOR) 10 MG tablet, Take 1 tablet (10 mg total) by mouth daily., Disp: 90 tablet, Rfl: 3   Semaglutide,0.25 or 0.'5MG'$ /DOS, (OZEMPIC, 0.25 OR 0.5 MG/DOSE,) 2 MG/3ML SOPN, Take 0.5 mg subq once weekly, Disp: 9 mL, Rfl: 3   tamsulosin (FLOMAX) 0.4 MG CAPS capsule, TAKE ONE CAPSULE BY MOUTH ONCE A DAY FOR URINATION AND PROSTATE, Disp: 90 capsule, Rfl: 3    traZODone (DESYREL) 50 MG tablet, Take 1 tablet (50 mg total) by mouth at bedtime as needed for sleep., Disp: 90 tablet, Rfl: 1   triamcinolone (NASACORT) 55 MCG/ACT AERO nasal inhaler, Place 2 sprays into the nose daily., Disp: 3 each, Rfl: 3   Objective:     Vitals:   02/15/22 0843  BP: (!) 138/90  Pulse: (!) 41  SpO2: 97%  Weight: 261 lb (118.4 kg)  Height: '5\' 10"'$  (1.778 m)      Body mass index is 37.45 kg/m.    Physical Exam:    Gen: Appears well, nad, nontoxic and pleasant Psych: Alert and  oriented, appropriate mood and affect Neuro: sensation intact, strength is 5/5 in upper and lower extremities, muscle tone wnl Skin: no susupicious lesions or rashes   Back - Normal skin, Spine with normal alignment and no deformity.   No tenderness to vertebral process palpation.   Paraspinous muscles are not tender and without spasm Straight leg raise negative Trendelenberg negative  Decreased cervical and lumbar ROM in all directions   Electronically signed by:  Aaron Mccreedy D.[facility] 9:28 AM 02/15/22

## 2022-02-03 DIAGNOSIS — M545 Low back pain, unspecified: Secondary | ICD-10-CM | POA: Diagnosis not present

## 2022-02-03 DIAGNOSIS — M532X2 Spinal instabilities, cervical region: Secondary | ICD-10-CM | POA: Diagnosis not present

## 2022-02-03 DIAGNOSIS — M542 Cervicalgia: Secondary | ICD-10-CM | POA: Diagnosis not present

## 2022-02-06 NOTE — Telephone Encounter (Signed)
Patient has taken all abx as prescribed and has scheduled a follow up appt.

## 2022-02-08 DIAGNOSIS — M542 Cervicalgia: Secondary | ICD-10-CM | POA: Diagnosis not present

## 2022-02-08 DIAGNOSIS — M545 Low back pain, unspecified: Secondary | ICD-10-CM | POA: Diagnosis not present

## 2022-02-08 DIAGNOSIS — M532X2 Spinal instabilities, cervical region: Secondary | ICD-10-CM | POA: Diagnosis not present

## 2022-02-09 ENCOUNTER — Ambulatory Visit (INDEPENDENT_AMBULATORY_CARE_PROVIDER_SITE_OTHER): Payer: Medicare Other | Admitting: Internal Medicine

## 2022-02-09 ENCOUNTER — Encounter: Payer: Self-pay | Admitting: Internal Medicine

## 2022-02-09 VITALS — BP 136/82 | HR 68 | Ht 70.0 in | Wt 261.0 lb

## 2022-02-09 DIAGNOSIS — E782 Mixed hyperlipidemia: Secondary | ICD-10-CM | POA: Diagnosis not present

## 2022-02-09 DIAGNOSIS — E1142 Type 2 diabetes mellitus with diabetic polyneuropathy: Secondary | ICD-10-CM

## 2022-02-09 DIAGNOSIS — I10 Essential (primary) hypertension: Secondary | ICD-10-CM | POA: Diagnosis not present

## 2022-02-09 DIAGNOSIS — Z23 Encounter for immunization: Secondary | ICD-10-CM

## 2022-02-09 DIAGNOSIS — N1831 Chronic kidney disease, stage 3a: Secondary | ICD-10-CM

## 2022-02-09 LAB — LIPID PANEL
Cholesterol: 82 mg/dL (ref 0–200)
HDL: 32.1 mg/dL — ABNORMAL LOW (ref >39.00)
LDL Cholesterol: 30 mg/dL (ref 0–99)
NonHDL: 49.86
Total CHOL/HDL Ratio: 3
Triglycerides: 99 mg/dL (ref 0.0–149.0)
VLDL: 19.8 mg/dL (ref 0.0–40.0)

## 2022-02-09 LAB — BASIC METABOLIC PANEL
BUN: 17 mg/dL (ref 6–23)
CO2: 33 mEq/L — ABNORMAL HIGH (ref 19–32)
Calcium: 10.4 mg/dL (ref 8.4–10.5)
Chloride: 102 mEq/L (ref 96–112)
Creatinine, Ser: 1.24 mg/dL (ref 0.40–1.50)
GFR: 56.31 mL/min — ABNORMAL LOW (ref >60.00)
Glucose, Bld: 197 mg/dL — ABNORMAL HIGH (ref 70–99)
Potassium: 4.5 mEq/L (ref 3.5–5.1)
Sodium: 138 mEq/L (ref 135–145)

## 2022-02-09 LAB — HEPATIC FUNCTION PANEL
ALT: 13 U/L (ref 0–53)
AST: 16 U/L (ref 0–37)
Albumin: 3.8 g/dL (ref 3.5–5.2)
Alkaline Phosphatase: 67 U/L (ref 39–117)
Bilirubin, Direct: 0.3 mg/dL (ref 0.0–0.3)
Total Bilirubin: 1.4 mg/dL — ABNORMAL HIGH (ref 0.2–1.2)
Total Protein: 6.9 g/dL (ref 6.0–8.3)

## 2022-02-09 NOTE — Progress Notes (Signed)
Patient ID: Aaron Maldonado, male   DOB: 12-06-1945, 76 y.o.   MRN: [id number]        Chief Complaint: follow up DM, ckd, htn, hld       HPI:  Aaron Maldonado is a 76 y.o. male here overall doing ok.  Due for flu shot.  Pt denies chest pain, increased sob or doe, wheezing, orthopnea, PND, increased LE swelling, palpitations, dizziness or syncope.   Pt denies polydipsia, polyuria, or new focal neuro s/s.    Pt denies fever, wt loss, night sweats, loss of appetite, or other constitutional symptoms    Neck and ack pain sometimes worse after the PT sessions, so did not go last Monday.  Wt Readings from Last 3 Encounters:  02/09/22 261 lb (118.4 kg)  01/25/22 253 lb (114.8 kg)  01/18/22 258 lb (117 kg)   BP Readings from Last 3 Encounters:  02/09/22 136/82  01/18/22 134/82  01/18/22 134/82         Past Medical History:  Diagnosis Date   Abnormal nuclear stress test 12/29/2013   Allergy    Arthritis    Chest pain    CHF (congestive heart failure) (HCC)    Chicken pox    Colon polyps    Coronary artery disease    Depression    Diabetes mellitus without complication (HCC)    Diverticulitis    Dyspnea on exertion    GERD (gastroesophageal reflux disease)    Heart murmur    History of heart surgery    Hyperlipidemia    Hypertension    Stroke [facility])    Past Surgical History:  Procedure Laterality Date   COLONOSCOPY     KNEE SURGERY     LEFT HEART CATHETERIZATION WITH CORONARY ANGIOGRAM N/A 01/06/2014   Procedure: LEFT HEART CATHETERIZATION WITH CORONARY ANGIOGRAM;  Surgeon: Lorretta Harp, MD;  Location: [facility] CATH LAB;  Service: Cardiovascular;  Laterality: N/A;   SHOULDER SURGERY     TONSILLECTOMY      reports that he quit smoking about 33 years ago. His smoking use included cigarettes. He has a 64.50 pack-year smoking history. He has never used smokeless tobacco. He reports that he does not drink alcohol and does not use drugs. family history includes Alzheimer's disease in  his father; Breast cancer in his sister; Heart disease in his mother; Heart failure in his mother; Hyperlipidemia in his maternal grandfather and maternal grandmother; Hypertension in his maternal grandfather, maternal grandmother, paternal grandfather, and paternal grandmother; 38 / [place] in his maternal grandfather and maternal grandmother; Mitral valve prolapse in his mother; SIDS in his daughter. Allergies  Allergen Reactions   Losartan     Other reaction(s): Angioedema (ALLERGY/intolerance)   Atorvastatin Other (See Comments)    Other reaction(s): Muscle pain   Keflex [Cephalexin]     unknown   Lisinopril Cough   Penicillins Rash    Has patient had a PCN reaction causing immediate rash, facial/tongue/throat swelling, SOB or lightheadedness with hypotension: No Has patient had a PCN reaction causing severe rash involving mucus membranes or skin necrosis: No Has patient had a PCN reaction that required hospitalization: No Has patient had a PCN reaction occurring within the last 10 years: No If all of the above answers are "NO", then may proceed with Cephalosporin use.    Sulfa Antibiotics Rash    rash   Sulfasalazine Rash    rash   Current Outpatient Medications on File Prior to Visit  Medication Sig Dispense  Refill   acetaminophen (TYLENOL) 500 MG tablet TAKE ONE TABLET BY MOUTH THREE TIMES A DAY AS NEEDED FOR MILD TO MODERATE PAIN, FEVER, BODY ACHES, HEADACHE     albuterol (VENTOLIN HFA) 108 (90 Base) MCG/ACT inhaler Inhale 2 puffs into the lungs every 6 (six) hours as needed for wheezing or shortness of breath. 24 g 3   Alcohol Swabs (ALCOHOL WIPES) 70 % PADS USE FOR INSULIN INJECTIONS OR TO FINGER STICK AS DIRECTED     allopurinol (ZYLOPRIM) 300 MG tablet Take 1 tablet (300 mg total) by mouth daily. 90 tablet 3   amLODipine (NORVASC) 5 MG tablet Take 1 tablet (5 mg total) by mouth daily. 90 tablet 3   aspirin 81 MG tablet Take 81 mg by mouth daily.     azelastine  (OPTIVAR) 0.05 % ophthalmic solution Place 1 drop into both eyes 2 (two) times daily. 6 mL 12   calcium carbonate (OS-CAL) 600 MG TABS tablet Take 600 mg by mouth daily.     carvedilol (COREG) 6.25 MG tablet Take 1 tablet (6.25 mg total) by mouth 2 (two) times daily with a meal. 180 tablet 3   cetirizine (ZYRTEC) 10 MG tablet Take 1 tablet (10 mg total) by mouth daily. 90 tablet 3   Cholecalciferol 50 MCG (2000 UT) TABS 1 tab by mouth once daily 30 tablet 99   Continuous Blood Gluc Receiver (FREESTYLE LIBRE 14 DAY READER) DEVI 1 Device by Does not apply route 4 (four) times daily. E11.9 1 Device 0   Continuous Blood Gluc Sensor (FREESTYLE LIBRE SENSOR SYSTEM) MISC 1 Device by Does not apply route every 14 (fourteen) days. E11.9 6 each 3   furosemide (LASIX) 40 MG tablet Take 1 tablet (40 mg total) by mouth daily as needed for edema. 90 tablet 3   gabapentin (NEURONTIN) 600 MG tablet 1 tab by mouth twice per day and 2 tabs at bedtime 360 tablet 1   insulin glargine (LANTUS SOLOSTAR) 100 UNIT/ML Solostar Pen Inject 60 Units into the skin daily at 10 pm. 15 mL 5   Insulin Pen Needle (BD PEN NEEDLE NANO U/F) 32G X 4 MM MISC 1 with pen use daily 100 each 3   metFORMIN (GLUCOPHAGE) 1000 MG tablet Take 1 tablet (1,000 mg total) by mouth 2 (two) times daily with a meal. 180 tablet 3   nitroGLYCERIN (NITROSTAT) 0.4 MG SL tablet Place 1 tablet (0.4 mg total) under the tongue every 5 (five) minutes as needed for chest pain. 90 tablet 3   Omega-3 Fatty Acids (FISH OIL) 1000 MG CAPS TAKE TWO CAPSULES BY MOUTH DAILY ELEVATED TRIGLYCERIDES     omeprazole (PRILOSEC) 20 MG capsule Take 1 capsule (20 mg total) by mouth 2 (two) times daily before a meal. 180 capsule 3   PARoxetine (PAXIL) 40 MG tablet Take 0.5 tablets (20 mg total) by mouth daily. 90 tablet 3   potassium chloride (KLOR-CON) 8 MEQ tablet Take 1 tablet (8 mEq total) by mouth daily. 90 tablet 3   rOPINIRole (REQUIP) 3 MG tablet Take 3 tablets (9 mg  total) by mouth at bedtime. 90 tablet 3   rosuvastatin (CRESTOR) 10 MG tablet Take 1 tablet (10 mg total) by mouth daily. 90 tablet 3   Semaglutide,0.25 or 0.'5MG'$ /DOS, (OZEMPIC, 0.25 OR 0.5 MG/DOSE,) 2 MG/3ML SOPN Take 0.5 mg subq once weekly 9 mL 3   tamsulosin (FLOMAX) 0.4 MG CAPS capsule TAKE ONE CAPSULE BY MOUTH ONCE A DAY FOR URINATION AND PROSTATE 90 capsule  3   traZODone (DESYREL) 50 MG tablet Take 1 tablet (50 mg total) by mouth at bedtime as needed for sleep. 90 tablet 1   triamcinolone (NASACORT) 55 MCG/ACT AERO nasal inhaler Place 2 sprays into the nose daily. 3 each 3   No current facility-administered medications on file prior to visit.        ROS:  All others reviewed and negative.  Objective        PE:  BP 136/82   Pulse 68   Ht '5\' 10"'$  (1.778 m)   Wt 261 lb (118.4 kg)   SpO2 97%   BMI 37.45 kg/m                 Constitutional: Pt appears in NAD               HENT: Head: NCAT.                Right Ear: External ear normal.                 Left Ear: External ear normal.                Eyes: . Pupils are equal, round, and reactive to light. Conjunctivae and EOM are normal               Nose: without d/c or deformity               Neck: Neck supple. Gross normal ROM               Cardiovascular: Normal rate and regular rhythm.                 Pulmonary/Chest: Effort normal and breath sounds without rales or wheezing.                Abd:  Soft, NT, ND, + BS, no organomegaly               Neurological: Pt is alert. At baseline orientation, motor grossly intact               Skin: Skin is warm. No rashes, no other new lesions, LE edema - none               Psychiatric: Pt behavior is normal without agitation   Micro: none  Cardiac tracings I have personally interpreted today:  none  Pertinent Radiological findings (summarize): none   Lab Results  Component Value Date   WBC 10.0 01/18/2022   HGB 14.0 01/18/2022   HCT 41.2 01/18/2022   PLT 144.0 (L) 01/18/2022    GLUCOSE 197 (H) 02/09/2022   CHOL 82 02/09/2022   TRIG 99.0 02/09/2022   HDL 32.10 (L) 02/09/2022   LDLDIRECT 37.0 01/18/2022   LDLCALC 30 02/09/2022   ALT 13 02/09/2022   AST 16 02/09/2022   NA 138 02/09/2022   K 4.5 02/09/2022   CL 102 02/09/2022   CREATININE 1.24 02/09/2022   BUN 17 02/09/2022   CO2 33 (H) 02/09/2022   TSH 3.23 08/18/2021   PSA 1.77 08/18/2021   INR 0.93 12/29/2013   HGBA1C 8.6 (H) 02/09/2022   MICROALBUR 7.2 (H) 08/18/2021   Assessment/Plan:  Aaron Maldonado is a 76 y.o. White or Caucasian [1] male with  has a past medical history of Abnormal nuclear stress test (12/29/2013), Allergy, Arthritis, Chest pain, CHF (congestive heart failure) ([place]), Chicken pox, Colon polyps, Coronary artery disease, Depression, Diabetes mellitus without complication (  [place]), Diverticulitis, Dyspnea on exertion, GERD (gastroesophageal reflux disease), Heart murmur, History of heart surgery, Hyperlipidemia, Hypertension, and Stroke ([place]).  CKD (chronic kidney disease) stage 3, GFR 30-59 ml/min (HCC) Lab Results  Component Value Date   CREATININE 1.24 02/09/2022   Stable overall, cont to avoid nephrotoxins   Diabetes ([place]) Lab Results  Component Value Date   HGBA1C 8.6 (H) 02/09/2022   Uncontrolled,  pt to continue current medical treatment high dose lantus, ozempic 2 mg weekly, metformin 1000 bid ; will need endo referral,  to f/u any worsening symptoms or concerns  Essential hypertension BP Readings from Last 3 Encounters:  02/09/22 136/82  01/18/22 134/82  01/18/22 134/82   Stable, pt to continue medical treatment norvasc 5 qd, coreg 6.25 bid   Hyperlipidemia Lab Results  Component Value Date   LDLCALC 30 02/09/2022   Stable, pt to continue current statin crestor 10 mg  Followup: Return in about 6 months (around 08/10/2022).  Cathlean Cower, MD 02/11/2022 8:34 PM [facility]

## 2022-02-09 NOTE — Patient Instructions (Signed)
You had the flu shot today  Please continue all other medications as before, and refills have been done if requested.  Please have the pharmacy call with any other refills you may need.  Please continue your efforts at being more active, low cholesterol diet, and weight control  Please keep your appointments with your specialists as you may have planned  Please go to the LAB at the blood drawing area for the tests to be done  You will be contacted by phone if any changes need to be made immediately.  Otherwise, you will receive a letter about your results with an explanation, but please check with MyChart first.  Please make an Appointment to return in 6 months, or sooner if needed

## 2022-02-10 LAB — HEMOGLOBIN A1C
Hgb A1c MFr Bld: 8.6 % of total Hgb — ABNORMAL HIGH (ref <5.7)
Mean Plasma Glucose: 200 mg/dL
eAG (mmol/L): 11.1 mmol/L

## 2022-02-11 ENCOUNTER — Encounter: Payer: Self-pay | Admitting: Internal Medicine

## 2022-02-11 NOTE — Assessment & Plan Note (Signed)
Lab Results  Component Value Date   LDLCALC 30 02/09/2022   Stable, pt to continue current statin crestor 10 mg

## 2022-02-11 NOTE — Assessment & Plan Note (Signed)
BP Readings from Last 3 Encounters:  02/09/22 136/82  01/18/22 134/82  01/18/22 134/82   Stable, pt to continue medical treatment norvasc 5 qd, coreg 6.25 bid

## 2022-02-11 NOTE — Assessment & Plan Note (Signed)
Lab Results  Component Value Date   CREATININE 1.24 02/09/2022   Stable overall, cont to avoid nephrotoxins

## 2022-02-11 NOTE — Assessment & Plan Note (Signed)
Lab Results  Component Value Date   HGBA1C 8.6 (H) 02/09/2022   Uncontrolled,  pt to continue current medical treatment high dose lantus, ozempic 2 mg weekly, metformin 1000 bid ; will need endo referral,  to f/u any worsening symptoms or concerns

## 2022-02-15 ENCOUNTER — Ambulatory Visit (INDEPENDENT_AMBULATORY_CARE_PROVIDER_SITE_OTHER): Payer: Medicare Other | Admitting: Sports Medicine

## 2022-02-15 VITALS — BP 138/90 | HR 41 | Ht 70.0 in | Wt 261.0 lb

## 2022-02-15 DIAGNOSIS — M5136 Other intervertebral disc degeneration, lumbar region: Secondary | ICD-10-CM | POA: Diagnosis not present

## 2022-02-15 DIAGNOSIS — M2550 Pain in unspecified joint: Secondary | ICD-10-CM | POA: Diagnosis not present

## 2022-02-15 DIAGNOSIS — G8929 Other chronic pain: Secondary | ICD-10-CM | POA: Diagnosis not present

## 2022-02-15 DIAGNOSIS — M545 Low back pain, unspecified: Secondary | ICD-10-CM | POA: Diagnosis not present

## 2022-02-15 DIAGNOSIS — M5030 Other cervical disc degeneration, unspecified cervical region: Secondary | ICD-10-CM | POA: Diagnosis not present

## 2022-02-15 DIAGNOSIS — M542 Cervicalgia: Secondary | ICD-10-CM | POA: Diagnosis not present

## 2022-02-15 DIAGNOSIS — Z981 Arthrodesis status: Secondary | ICD-10-CM

## 2022-02-15 LAB — C-REACTIVE PROTEIN: CRP: <1 mg/dL (ref 0.5–20.0)

## 2022-02-15 LAB — FERRITIN: Ferritin: 50.8 ng/mL (ref 22.0–322.0)

## 2022-02-15 LAB — URIC ACID: Uric Acid, Serum: 2.8 mg/dL — ABNORMAL LOW (ref 4.0–7.8)

## 2022-02-15 LAB — VITAMIN D 25 HYDROXY (VIT D DEFICIENCY, FRACTURES): VITD: 45.88 ng/mL (ref 30.00–100.00)

## 2022-02-15 LAB — TSH: TSH: 2.25 u[IU]/mL (ref 0.35–5.50)

## 2022-02-15 LAB — SEDIMENTATION RATE: Sed Rate: 21 mm/hr — ABNORMAL HIGH (ref 0–20)

## 2022-02-15 NOTE — Patient Instructions (Addendum)
Good to see you  Labs on the way out  Tylenol [phone] mg 2-3 times a day for pain relief  May use Ibuprofen for breakthrough pain , Recommend no more than 1-2 time per week  Continue HEP  As needed follow up

## 2022-02-18 LAB — ANA: Anti Nuclear Antibody (ANA): NEGATIVE

## 2022-02-18 LAB — RHEUMATOID FACTOR: Rheumatoid fact SerPl-aCnc: 20 IU/mL — ABNORMAL HIGH (ref <14)

## 2022-02-18 LAB — CYCLIC CITRUL PEPTIDE ANTIBODY, IGG: Cyclic Citrullin Peptide Ab: <16 UNITS

## 2022-02-20 ENCOUNTER — Ambulatory Visit: Payer: Medicare Other | Admitting: Internal Medicine

## 2022-05-09 ENCOUNTER — Ambulatory Visit: Payer: Medicare Other | Attending: Cardiovascular Disease | Admitting: Cardiovascular Disease

## 2022-05-09 ENCOUNTER — Encounter: Payer: Self-pay | Admitting: Cardiovascular Disease

## 2022-05-09 VITALS — BP 114/76 | HR 69 | Ht 71.0 in | Wt 253.2 lb

## 2022-05-09 DIAGNOSIS — E782 Mixed hyperlipidemia: Secondary | ICD-10-CM | POA: Diagnosis not present

## 2022-05-09 DIAGNOSIS — I2510 Atherosclerotic heart disease of native coronary artery without angina pectoris: Secondary | ICD-10-CM

## 2022-05-09 DIAGNOSIS — R072 Precordial pain: Secondary | ICD-10-CM

## 2022-05-09 DIAGNOSIS — I10 Essential (primary) hypertension: Secondary | ICD-10-CM

## 2022-05-09 NOTE — Assessment & Plan Note (Signed)
History of atypical chest pain in the past with a negative cath performed by myself 01/06/2014 and again subsequently by Dr. Philbert Riser revealing similar nonobstructive anatomy.  He denies chest pain since I saw him.

## 2022-05-09 NOTE — Progress Notes (Signed)
05/09/2022 Aaron Maldonado   11-16-1945  [id number]  Primary Physician Jenny Reichmann Hunt Oris, MD Primary Cardiologist: Lorretta Harp MD Lupe Carney, [place]  HPI:  Aaron Maldonado is a 76 y.o.   moderately overweight divorced Caucasian male father of 4 living children (one deceased) and great grandfather to 6 grandchildren who I last saw in the office 02/25/2021.Marland Kitchen  He is a retired Building surveyor. His primary care is provided by the [facility] in [place]. He was referred by the ER at [facility] for evaluation of chest pain. His cardiac risk factor profile is remarkable for 60 pack years of tobacco abuse having quit back in 1990. He has treated hypertension, diabetes or hyperlipidemia. There is no family history of heart disease. He's never had a heart attack or stroke. 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 He was complaining of chest pain and shortness of breath when I saw him 2 years ago which led to a Myoview stress test 12/11/13 that is high risk with apical scar and peri-infarct ischemia. His EF likewise was reduced by 2-D echo in the 40-45% range with an elevated apical and anterolateral as well as septal hypokinesia. This led to cardiac catheterization performed 01/06/14 by myself revealing noncritical CAD with normal LV function. In retrospect, he attributes the chest pain to falling in his bathtub several weeks prior to seeing me at that time.    He did have an episode of presyncope and was evaluated in [place] and transferred to [facility] where he had white cardiac catheterization by Dr. Philbert Riser revealing minimal CAD and an echo that showed normal LV function. He did have an event monitor that showed runs of nonsustained ventricular tachycardia. His losartan was discontinued and he was begun on amlodipine.   Since I saw him in the office a year ago he continues to do well.  He was  evaluated by his PCP for a persistent cough without a diagnosis but was treated with an antibiotic.  He denies chest pain or shortness of breath.   Current Meds  Medication Sig   acetaminophen (TYLENOL) 500 MG tablet TAKE ONE TABLET BY MOUTH THREE TIMES A DAY AS NEEDED FOR MILD TO MODERATE PAIN, FEVER, BODY ACHES, HEADACHE   albuterol (VENTOLIN HFA) 108 (90 Base) MCG/ACT inhaler Inhale 2 puffs into the lungs every 6 (six) hours as needed for wheezing or shortness of breath.   Alcohol Swabs (ALCOHOL WIPES) 70 % PADS USE FOR INSULIN INJECTIONS OR TO FINGER STICK AS DIRECTED   allopurinol (ZYLOPRIM) 300 MG tablet Take 1 tablet (300 mg total) by mouth daily.   amLODipine (NORVASC) 5 MG tablet Take 1 tablet (5 mg total) by mouth daily.   aspirin 81 MG tablet Take 81 mg by mouth daily.   azelastine (OPTIVAR) 0.05 % ophthalmic solution Place 1 drop into both eyes 2 (two) times daily.   calcium carbonate (OS-CAL) 600 MG TABS tablet Take 600 mg by mouth daily.   carvedilol (COREG) 6.25 MG tablet Take 1 tablet (6.25 mg total) by mouth 2 (two) times daily with a meal.   cetirizine (ZYRTEC) 10 MG tablet Take 1 tablet (10 mg total) by mouth daily.   Cholecalciferol 50 MCG (2000 UT) TABS 1 tab by mouth once daily   Continuous Blood Gluc Receiver (FREESTYLE LIBRE 14 DAY READER) DEVI 1 Device by Does not apply route 4 (four) times daily.  E11.9   Continuous Blood Gluc Sensor (FREESTYLE LIBRE SENSOR SYSTEM) MISC 1 Device by Does not apply route every 14 (fourteen) days. E11.9   furosemide (LASIX) 40 MG tablet Take 1 tablet (40 mg total) by mouth daily as needed for edema.   gabapentin (NEURONTIN) 600 MG tablet 1 tab by mouth twice per day and 2 tabs at bedtime   insulin glargine (LANTUS SOLOSTAR) 100 UNIT/ML Solostar Pen Inject 60 Units into the skin daily at 10 pm.   Insulin Pen Needle (BD PEN NEEDLE NANO U/F) 32G X 4 MM MISC 1 with pen use daily   metFORMIN (GLUCOPHAGE) 1000 MG tablet Take 1 tablet (1,000 mg  total) by mouth 2 (two) times daily with a meal.   nitroGLYCERIN (NITROSTAT) 0.4 MG SL tablet Place 1 tablet (0.4 mg total) under the tongue every 5 (five) minutes as needed for chest pain.   Omega-3 Fatty Acids (FISH OIL) 1000 MG CAPS TAKE TWO CAPSULES BY MOUTH DAILY ELEVATED TRIGLYCERIDES   omeprazole (PRILOSEC) 20 MG capsule Take 1 capsule (20 mg total) by mouth 2 (two) times daily before a meal.   PARoxetine (PAXIL) 40 MG tablet Take 0.5 tablets (20 mg total) by mouth daily.   potassium chloride (KLOR-CON) 8 MEQ tablet Take 1 tablet (8 mEq total) by mouth daily.   rOPINIRole (REQUIP) 3 MG tablet Take 3 tablets (9 mg total) by mouth at bedtime.   rosuvastatin (CRESTOR) 10 MG tablet Take 1 tablet (10 mg total) by mouth daily.   Semaglutide,0.25 or 0.'5MG'$ /DOS, (OZEMPIC, 0.25 OR 0.5 MG/DOSE,) 2 MG/3ML SOPN Take 0.5 mg subq once weekly   tamsulosin (FLOMAX) 0.4 MG CAPS capsule TAKE ONE CAPSULE BY MOUTH ONCE A DAY FOR URINATION AND PROSTATE   traZODone (DESYREL) 50 MG tablet Take 1 tablet (50 mg total) by mouth at bedtime as needed for sleep.   triamcinolone (NASACORT) 55 MCG/ACT AERO nasal inhaler Place 2 sprays into the nose daily.     Allergies  Allergen Reactions   Losartan     Other reaction(s): Angioedema (ALLERGY/intolerance)   Atorvastatin Other (See Comments)    Other reaction(s): Muscle pain   Keflex [Cephalexin]     unknown   Lisinopril Cough   Penicillins Rash    Has patient had a PCN reaction causing immediate rash, facial/tongue/throat swelling, SOB or lightheadedness with hypotension: No Has patient had a PCN reaction causing severe rash involving mucus membranes or skin necrosis: No Has patient had a PCN reaction that required hospitalization: No Has patient had a PCN reaction occurring within the last 10 years: No If all of the above answers are "NO", then may proceed with Cephalosporin use.    Sulfa Antibiotics Rash    rash   Sulfasalazine Rash    rash    Social  History   Socioeconomic History   Marital status: Divorced    Spouse name: Not on file   Number of children: 3   Years of education: 9   Highest education level: Not on file  Occupational History   Occupation: retired    Comment: former truck Geophysicist/field seismologist  Tobacco Use   Smoking status: Former    Packs/day: 1.50    Years: 43.00    Total pack years: 64.50    Types: Cigarettes    Quit date: 05/22/1988    Years since quitting: 33.9   Smokeless tobacco: Never  Vaping Use   Vaping Use: Never used  Substance and Sexual Activity   Alcohol use: No  Drug use: No   Sexual activity: Not Currently  Other Topics Concern   Not on file  Social History Narrative   Fun/Hobby: Work    Denies abuse and feels safe at home.    Social Determinants of Health   Financial Resource Strain: Low Risk  (01/25/2022)   Overall Financial Resource Strain (CARDIA)    Difficulty of Paying Living Expenses: Not hard at all  Food Insecurity: No Food Insecurity (01/25/2022)   Hunger Vital Sign    Worried About Running Out of Food in the Last Year: Never true    Ran Out of Food in the Last Year: Never true  Transportation Needs: No Transportation Needs (01/25/2022)   PRAPARE - Hydrologist (Medical): No    Lack of Transportation (Non-Medical): No  Physical Activity: Inactive (01/25/2022)   Exercise Vital Sign    Days of Exercise per Week: 0 days    Minutes of Exercise per Session: 0 min  Stress: No Stress Concern Present (01/25/2022)   [place]    Feeling of Stress : Not at all  Social Connections: Socially Isolated (01/25/2022)   Social Connection and Isolation Panel [NHANES]    Frequency of Communication with Friends and Family: Three times a week    Frequency of Social Gatherings with Friends and Family: Twice a week    Attends Religious Services: Never    Marine scientist or Organizations: No    Attends Theatre manager Meetings: Never    Marital Status: Divorced  Human resources officer Violence: Unknown (01/25/2022)   Humiliation, Afraid, Rape, and Kick questionnaire    Fear of Current or Ex-Partner: Not on file    Emotionally Abused: No    Physically Abused: No    Sexually Abused: No     Review of Systems: General: negative for chills, fever, night sweats or weight changes.  Cardiovascular: negative for chest pain, dyspnea on exertion, edema, orthopnea, palpitations, paroxysmal nocturnal dyspnea or shortness of breath Dermatological: negative for rash Respiratory: negative for cough or wheezing Urologic: negative for hematuria Abdominal: negative for nausea, vomiting, diarrhea, bright red blood per rectum, melena, or hematemesis Neurologic: negative for visual changes, syncope, or dizziness All other systems reviewed and are otherwise negative except as noted above.    Blood pressure 114/76, pulse 69, height '5\' 11"'$  (1.803 m), weight 253 lb 3.2 oz (114.9 kg), SpO2 98 %.  General appearance: alert and no distress Neck: no adenopathy, no carotid bruit, no JVD, supple, symmetrical, trachea midline, and thyroid not enlarged, symmetric, no tenderness/mass/nodules Lungs: clear to auscultation bilaterally Heart: regular rate and rhythm, S1, S2 normal, no murmur, click, rub or gallop Extremities: extremities normal, atraumatic, no cyanosis or edema Pulses: 2+ and symmetric Skin: Skin color, texture, turgor normal. No rashes or lesions Neurologic: Grossly normal  EKG sinus rhythm at 69 with occasional PVCs.  ASSESSMENT AND PLAN:   Essential hypertension History of essential hypertension blood pressure measured today at 114/76.  He is on amlodipine and carvedilol.  Hyperlipidemia History of hyperlipidemia on statin therapy lipid profile performed 02/09/2022 revealing total cholesterol of 82, LDL 30 and HDL 32.  Chest pain History of atypical chest pain in the past with a negative cath performed  by myself 01/06/2014 and again subsequently by Dr. Philbert Riser revealing similar nonobstructive anatomy.  He denies chest pain since I saw him.     Lorretta Harp MD FACP,FACC,FAHA, [facility] 05/09/2022 12:10 PM

## 2022-05-09 NOTE — Assessment & Plan Note (Signed)
History of hyperlipidemia on statin therapy lipid profile performed 02/09/2022 revealing total cholesterol of 82, LDL 30 and HDL 32.

## 2022-05-09 NOTE — Patient Instructions (Signed)
Medication Instructions:  Your physician recommends that you continue on your current medications as directed. Please refer to the Current Medication list given to you today.  *If you need a refill on your cardiac medications before your next appointment, please call your pharmacy*   Follow-Up: At [facility], you and your health needs are our priority.  As part of our continuing mission to provide you with exceptional heart care, we have created designated Provider Care Teams.  These Care Teams include your primary Cardiologist (physician) and Advanced Practice Providers (APPs -  Physician Assistants and Nurse Practitioners) who all work together to provide you with the care you need, when you need it.  We recommend signing up for the patient portal called "MyChart".  Sign up information is provided on this After Visit Summary.  MyChart is used to connect with patients for Virtual Visits (Telemedicine).  Patients are able to view lab/test results, encounter notes, upcoming appointments, etc.  Non-urgent messages can be sent to your provider as well.   To learn more about what you can do with MyChart, go to [email or web address].    Your next appointment:   12 month(s)  The format for your next appointment:   In Person  Provider:   Jonathan Berry, MD   

## 2022-05-09 NOTE — Assessment & Plan Note (Signed)
History of essential hypertension blood pressure measured today at 114/76.  He is on amlodipine and carvedilol.

## 2022-05-28 IMAGING — DX DG CHEST 2V
2 series · 2 of 2 positions shown · non-contrast
Comparison: Chest x-ray 11/01/2016.

CLINICAL DATA: Cough.

EXAM:
CHEST - 2 VIEW

[chest pa]
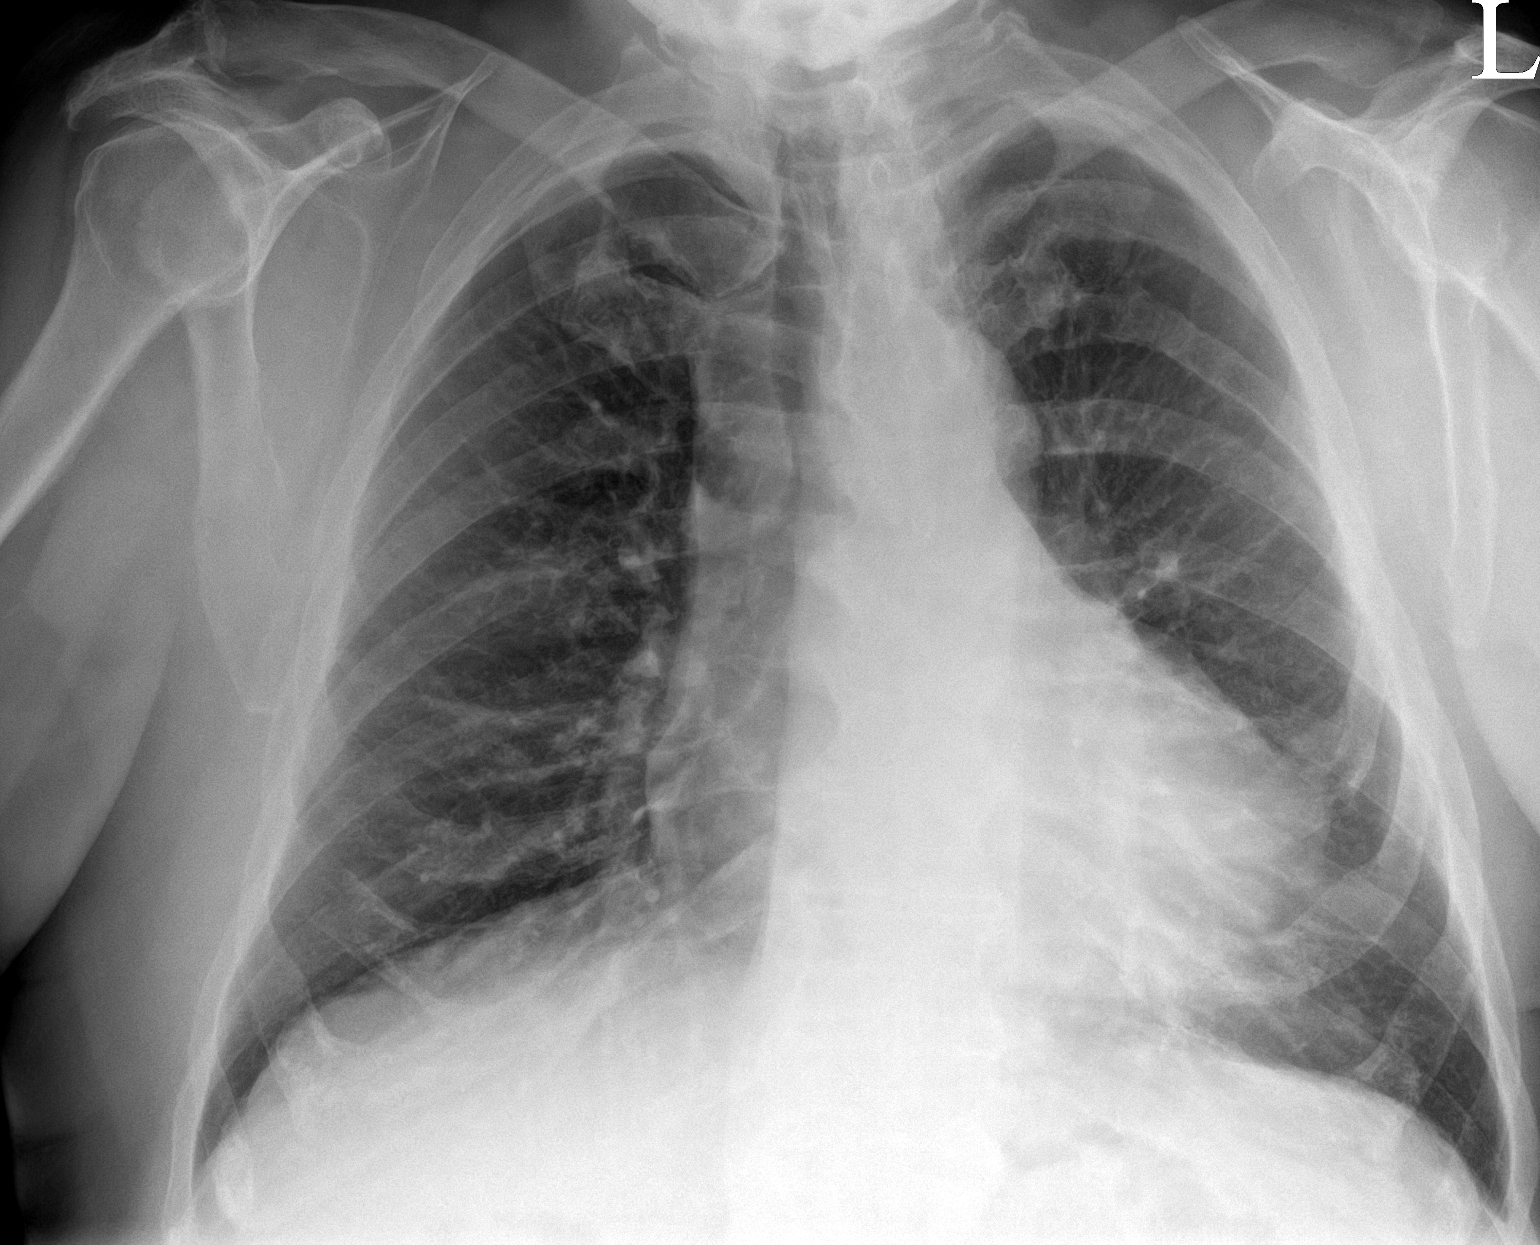

[chest lat]
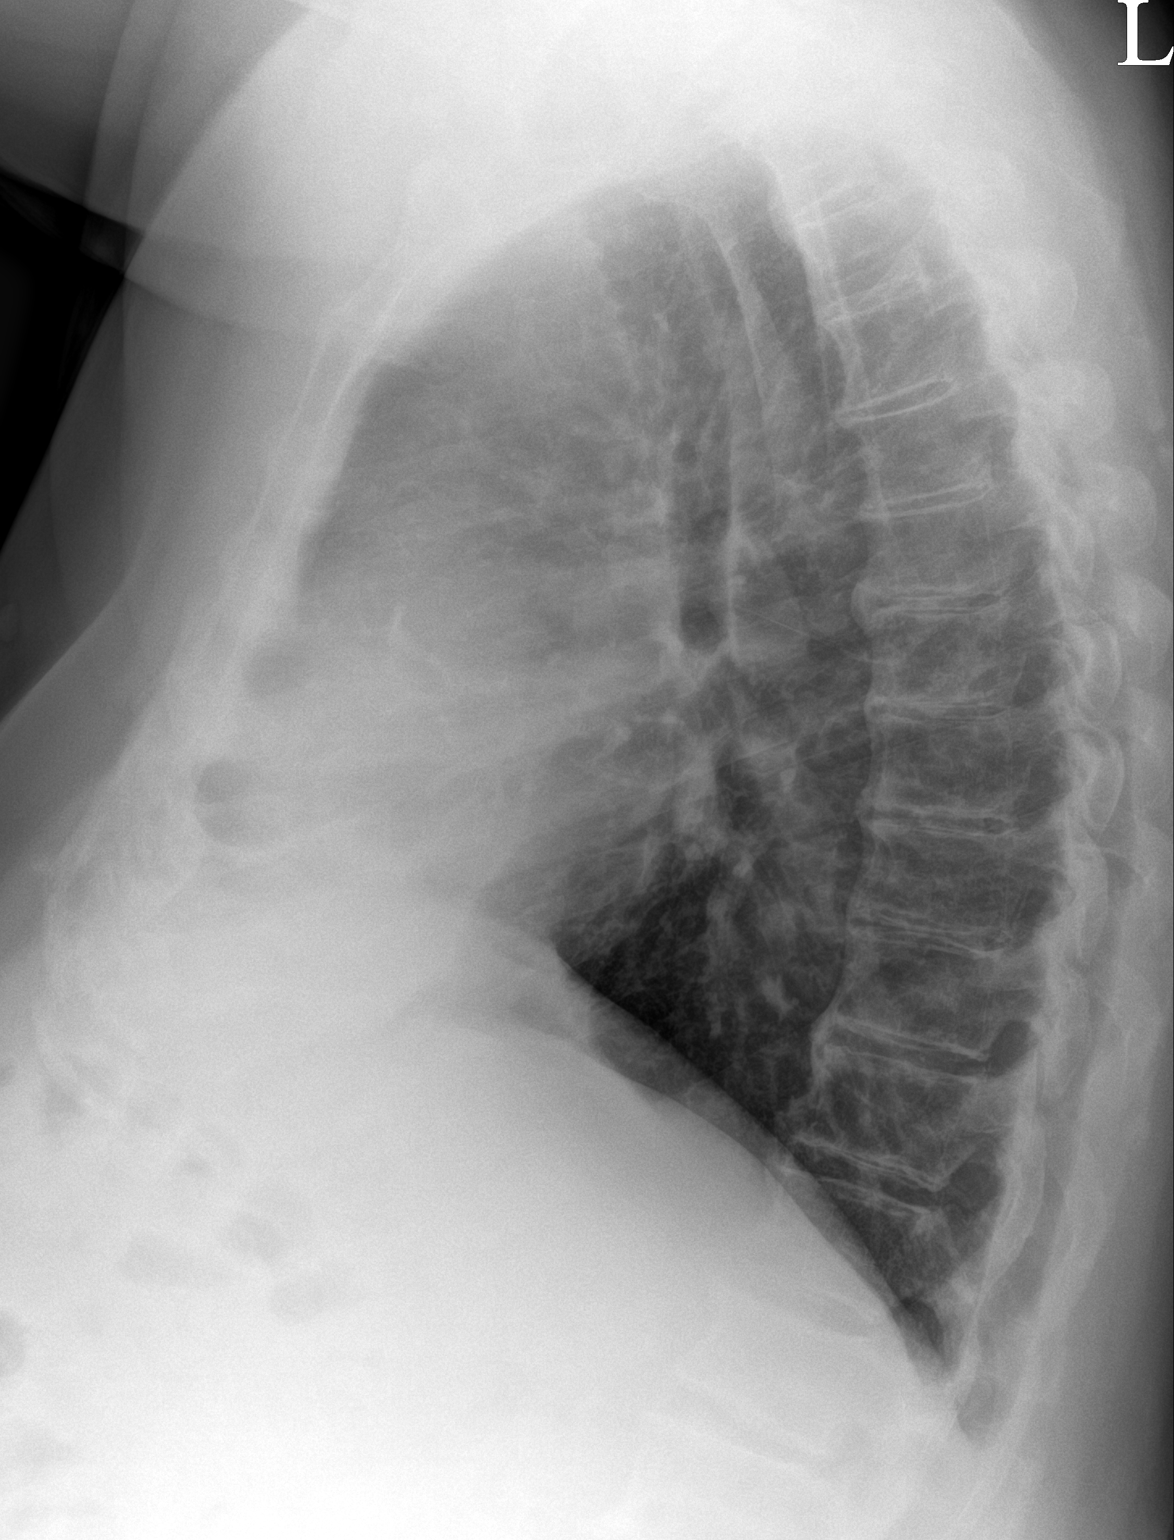

[2 of 2 positions shown; findings below may reference images not displayed]

FINDINGS: The heart size and mediastinal contours are within normal limits.
Both lungs are clear. The visualized skeletal structures are
unremarkable.
IMPRESSION: No active cardiopulmonary disease.

## 2022-08-04 ENCOUNTER — Ambulatory Visit: Payer: Medicare Other | Admitting: Internal Medicine

## 2022-08-10 ENCOUNTER — Ambulatory Visit: Payer: Medicare Other | Admitting: Internal Medicine

## 2022-11-01 DIAGNOSIS — M549 Dorsalgia, unspecified: Secondary | ICD-10-CM | POA: Diagnosis not present

## 2022-11-01 DIAGNOSIS — R42 Dizziness and giddiness: Secondary | ICD-10-CM | POA: Diagnosis not present

## 2022-11-01 DIAGNOSIS — Z87891 Personal history of nicotine dependence: Secondary | ICD-10-CM | POA: Diagnosis not present

## 2022-11-01 DIAGNOSIS — I509 Heart failure, unspecified: Secondary | ICD-10-CM | POA: Diagnosis not present

## 2022-11-01 DIAGNOSIS — W19XXXA Unspecified fall, initial encounter: Secondary | ICD-10-CM | POA: Diagnosis not present

## 2022-11-01 DIAGNOSIS — I110 Hypertensive heart disease with heart failure: Secondary | ICD-10-CM | POA: Diagnosis not present

## 2022-11-01 DIAGNOSIS — N3000 Acute cystitis without hematuria: Secondary | ICD-10-CM | POA: Diagnosis not present

## 2022-11-01 DIAGNOSIS — M5459 Other low back pain: Secondary | ICD-10-CM | POA: Diagnosis not present

## 2022-11-01 DIAGNOSIS — M545 Low back pain, unspecified: Secondary | ICD-10-CM | POA: Diagnosis not present

## 2022-11-01 DIAGNOSIS — Z043 Encounter for examination and observation following other accident: Secondary | ICD-10-CM | POA: Diagnosis not present

## 2022-11-01 DIAGNOSIS — I6782 Cerebral ischemia: Secondary | ICD-10-CM | POA: Diagnosis not present

## 2022-11-07 DIAGNOSIS — R55 Syncope and collapse: Secondary | ICD-10-CM | POA: Diagnosis not present

## 2023-01-05 DIAGNOSIS — R9389 Abnormal findings on diagnostic imaging of other specified body structures: Secondary | ICD-10-CM | POA: Diagnosis not present

## 2023-01-05 DIAGNOSIS — E118 Type 2 diabetes mellitus with unspecified complications: Secondary | ICD-10-CM | POA: Diagnosis not present

## 2023-01-05 DIAGNOSIS — H538 Other visual disturbances: Secondary | ICD-10-CM | POA: Diagnosis not present

## 2023-01-05 DIAGNOSIS — I10 Essential (primary) hypertension: Secondary | ICD-10-CM | POA: Diagnosis not present

## 2023-01-05 DIAGNOSIS — H5711 Ocular pain, right eye: Secondary | ICD-10-CM | POA: Diagnosis not present

## 2023-01-05 DIAGNOSIS — H02409 Unspecified ptosis of unspecified eyelid: Secondary | ICD-10-CM | POA: Diagnosis not present

## 2023-01-05 DIAGNOSIS — R519 Headache, unspecified: Secondary | ICD-10-CM | POA: Diagnosis not present

## 2023-01-05 DIAGNOSIS — H05221 Edema of right orbit: Secondary | ICD-10-CM | POA: Diagnosis not present

## 2023-01-05 DIAGNOSIS — E119 Type 2 diabetes mellitus without complications: Secondary | ICD-10-CM | POA: Diagnosis not present

## 2023-01-05 DIAGNOSIS — M316 Other giant cell arteritis: Secondary | ICD-10-CM | POA: Diagnosis not present

## 2023-01-06 DIAGNOSIS — R9389 Abnormal findings on diagnostic imaging of other specified body structures: Secondary | ICD-10-CM | POA: Diagnosis not present

## 2023-01-09 DIAGNOSIS — X58XXXA Exposure to other specified factors, initial encounter: Secondary | ICD-10-CM | POA: Diagnosis not present

## 2023-01-09 DIAGNOSIS — Z794 Long term (current) use of insulin: Secondary | ICD-10-CM | POA: Diagnosis not present

## 2023-01-09 DIAGNOSIS — T380X5A Adverse effect of glucocorticoids and synthetic analogues, initial encounter: Secondary | ICD-10-CM | POA: Diagnosis not present

## 2023-01-09 DIAGNOSIS — Z7984 Long term (current) use of oral hypoglycemic drugs: Secondary | ICD-10-CM | POA: Diagnosis not present

## 2023-01-09 DIAGNOSIS — E1165 Type 2 diabetes mellitus with hyperglycemia: Secondary | ICD-10-CM | POA: Diagnosis not present

## 2023-01-09 DIAGNOSIS — Z7952 Long term (current) use of systemic steroids: Secondary | ICD-10-CM | POA: Diagnosis not present

## 2023-01-09 DIAGNOSIS — M316 Other giant cell arteritis: Secondary | ICD-10-CM | POA: Diagnosis not present

## 2023-01-09 DIAGNOSIS — R739 Hyperglycemia, unspecified: Secondary | ICD-10-CM | POA: Diagnosis not present

## 2023-01-09 DIAGNOSIS — Z7985 Long-term (current) use of injectable non-insulin antidiabetic drugs: Secondary | ICD-10-CM | POA: Diagnosis not present

## 2023-01-11 DIAGNOSIS — E1165 Type 2 diabetes mellitus with hyperglycemia: Secondary | ICD-10-CM | POA: Diagnosis not present

## 2023-01-11 DIAGNOSIS — Z794 Long term (current) use of insulin: Secondary | ICD-10-CM | POA: Diagnosis not present

## 2023-01-11 DIAGNOSIS — R3589 Other polyuria: Secondary | ICD-10-CM | POA: Diagnosis not present

## 2023-01-11 DIAGNOSIS — R5383 Other fatigue: Secondary | ICD-10-CM | POA: Diagnosis not present

## 2023-01-14 DIAGNOSIS — E1165 Type 2 diabetes mellitus with hyperglycemia: Secondary | ICD-10-CM | POA: Diagnosis not present

## 2023-01-14 DIAGNOSIS — Z794 Long term (current) use of insulin: Secondary | ICD-10-CM | POA: Diagnosis not present

## 2023-01-16 DIAGNOSIS — E1169 Type 2 diabetes mellitus with other specified complication: Secondary | ICD-10-CM | POA: Diagnosis not present

## 2023-01-16 DIAGNOSIS — Z794 Long term (current) use of insulin: Secondary | ICD-10-CM | POA: Diagnosis not present

## 2023-01-17 DIAGNOSIS — H5711 Ocular pain, right eye: Secondary | ICD-10-CM | POA: Diagnosis not present

## 2023-01-17 DIAGNOSIS — M316 Other giant cell arteritis: Secondary | ICD-10-CM | POA: Diagnosis not present

## 2023-03-13 DIAGNOSIS — E119 Type 2 diabetes mellitus without complications: Secondary | ICD-10-CM | POA: Diagnosis not present

## 2023-03-13 DIAGNOSIS — R059 Cough, unspecified: Secondary | ICD-10-CM | POA: Diagnosis not present

## 2023-03-13 DIAGNOSIS — J209 Acute bronchitis, unspecified: Secondary | ICD-10-CM | POA: Diagnosis not present

## 2023-04-03 DIAGNOSIS — H47011 Ischemic optic neuropathy, right eye: Secondary | ICD-10-CM | POA: Diagnosis not present

## 2023-04-03 DIAGNOSIS — M316 Other giant cell arteritis: Secondary | ICD-10-CM | POA: Diagnosis not present

## 2023-05-21 DIAGNOSIS — M316 Other giant cell arteritis: Secondary | ICD-10-CM | POA: Diagnosis not present

## 2023-06-01 DIAGNOSIS — H47011 Ischemic optic neuropathy, right eye: Secondary | ICD-10-CM | POA: Diagnosis not present

## 2023-06-01 DIAGNOSIS — M316 Other giant cell arteritis: Secondary | ICD-10-CM | POA: Diagnosis not present

## 2023-06-30 DIAGNOSIS — H47011 Ischemic optic neuropathy, right eye: Secondary | ICD-10-CM | POA: Diagnosis not present

## 2023-06-30 DIAGNOSIS — M316 Other giant cell arteritis: Secondary | ICD-10-CM | POA: Diagnosis not present

## 2023-07-04 DIAGNOSIS — M316 Other giant cell arteritis: Secondary | ICD-10-CM | POA: Diagnosis not present

## 2023-07-04 DIAGNOSIS — H47011 Ischemic optic neuropathy, right eye: Secondary | ICD-10-CM | POA: Diagnosis not present

## 2023-08-04 DIAGNOSIS — M316 Other giant cell arteritis: Secondary | ICD-10-CM | POA: Diagnosis not present

## 2023-08-04 DIAGNOSIS — H47011 Ischemic optic neuropathy, right eye: Secondary | ICD-10-CM | POA: Diagnosis not present

## 2023-10-06 DIAGNOSIS — M316 Other giant cell arteritis: Secondary | ICD-10-CM | POA: Diagnosis not present

## 2023-10-06 DIAGNOSIS — H47011 Ischemic optic neuropathy, right eye: Secondary | ICD-10-CM | POA: Diagnosis not present

## 2023-10-31 DIAGNOSIS — M316 Other giant cell arteritis: Secondary | ICD-10-CM | POA: Diagnosis not present

## 2023-10-31 DIAGNOSIS — H47011 Ischemic optic neuropathy, right eye: Secondary | ICD-10-CM | POA: Diagnosis not present

## 2024-02-19 ENCOUNTER — Encounter: Payer: Self-pay | Admitting: Cardiovascular Disease

## 2024-02-19 ENCOUNTER — Ambulatory Visit: Attending: Cardiovascular Disease | Admitting: Cardiovascular Disease

## 2024-02-19 VITALS — BP 106/54 | HR 67 | Ht 68.0 in | Wt 252.0 lb

## 2024-02-19 DIAGNOSIS — I10 Essential (primary) hypertension: Secondary | ICD-10-CM

## 2024-02-19 DIAGNOSIS — I2510 Atherosclerotic heart disease of native coronary artery without angina pectoris: Secondary | ICD-10-CM | POA: Diagnosis not present

## 2024-02-19 DIAGNOSIS — Z01818 Encounter for other preprocedural examination: Secondary | ICD-10-CM | POA: Diagnosis not present

## 2024-02-19 DIAGNOSIS — E782 Mixed hyperlipidemia: Secondary | ICD-10-CM

## 2024-02-19 DIAGNOSIS — R0989 Other specified symptoms and signs involving the circulatory and respiratory systems: Secondary | ICD-10-CM

## 2024-02-19 NOTE — Assessment & Plan Note (Signed)
 Patient scheduled for an elective outpatient bladder procedure tomorrow at the [facility].  He is cleared by cardiology point of view however since I auscultated bilateral carotid bruits carotid Doppler will need to be performed prior to his procedure.

## 2024-02-19 NOTE — Patient Instructions (Signed)
 Medication Instructions:  Your physician recommends that you continue on your current medications as directed. Please refer to the Current Medication list given to you today.  *If you need a refill on your cardiac medications before your next appointment, please call your pharmacy*  Testing/Procedures: Your physician has requested that you have a carotid duplex. This test is an ultrasound of the carotid arteries in your neck. It looks at blood flow through these arteries that supply the brain with blood. Allow one hour for this exam. There are no restrictions or special instructions. This will take place at [address], 4th floor  Please note: We ask at that you not bring children with you during ultrasound (echo/ vascular) testing. Due to room size and safety concerns, children are not allowed in the ultrasound rooms during exams. Our front office staff cannot provide observation of children in our lobby area while testing is being conducted. An adult accompanying a patient to their appointment will only be allowed in the ultrasound room at the discretion of the ultrasound technician under special circumstances. We apologize for any inconvenience.   Follow-Up: At [facility], you and your health needs are our priority.  As part of our continuing mission to provide you with exceptional heart care, our providers are all part of one team.  This team includes your primary Cardiologist (physician) and Advanced Practice Providers or APPs (Physician Assistants and Nurse Practitioners) who all work together to provide you with the care you need, when you need it.  Your next appointment:   12 month(s)  Provider:   Lauro Portal, MD    We recommend signing up for the patient portal called "MyChart".  Sign up information is provided on this After Visit Summary.  MyChart is used to connect with patients for Virtual Visits (Telemedicine).  Patients are able to view lab/test results, encounter  notes, upcoming appointments, etc.  Non-urgent messages can be sent to your provider as well.   To learn more about what you can do with MyChart, go to [email or web address].

## 2024-02-19 NOTE — Assessment & Plan Note (Signed)
 History of hyperlipidemia on statin therapy with lipid profile performed 02/09/2022 revealing total cholesterol 82, LDL 30 and HDL of 32 followed by the [facility].

## 2024-02-19 NOTE — Assessment & Plan Note (Signed)
 History of nonobstructive CAD by cath performed by myself 7/15 and again at [facility] by Dr. Isidor in 2018.  He is asymptomatic otherwise.

## 2024-02-19 NOTE — Progress Notes (Signed)
 02/19/2024 Aaron Maldonado   09-27-1945  [id number]  [facility]ologist: Dorn JINNY Lesches MD [facility] [place], [place]  HPI:  Aaron Maldonado is a 78 y.o.   moderately overweight divorced Caucasian male father of 4 living children (one deceased) and great grandfather to 6 grandchildren who I last saw in the office 05/09/2022.  He is accompanied by one of his daughters Aaron Maldonado today...  He is a retired Social worker. His primary care is provided by the [facility] in [place]. He was referred by the ER at [facility] for evaluation of chest pain. His cardiac risk factor profile is remarkable for 60 pack years of tobacco abuse having quit back in 1990. He has treated hypertension, diabetes or hyperlipidemia. There is no family history of heart disease. He's never had a heart attack or stroke. He has had what sounds like a VSD repair in [place] back in 1954. He was complaining of chest pain and shortness of breath when I saw him 2 years ago which led to a Myoview stress test 12/11/13 that is high risk with apical scar and peri-infarct ischemia. His EF likewise was reduced by 2-D echo in the 40-45% range with an elevated apical and anterolateral as well as septal hypokinesia. This led to cardiac catheterization performed 01/06/14 by myself revealing noncritical CAD with normal LV function. In retrospect, he attributes the chest pain to falling in his bathtub several weeks prior to seeing me at that time.    He did have an episode of presyncope and was evaluated in [place] and transferred to [facility] where he had white cardiac catheterization by Dr. Isidor revealing minimal CAD and an echo that showed normal LV function. He did have an event monitor that showed runs of nonsustained ventricular tachycardia. His losartan  was discontinued and he was begun on amlodipine .   Since I saw him in the  office h 2 years ago he continues to do well.  He walks with a cane for stability.  He denies chest pain or shortness of breath.  He apparently had an outpatient bladder procedure and needs to have that repeated tomorrow.  The pathology is unavailable.  I did hear bilateral carotid bruits on exam today which will need to be followed by duplex ultrasound and his procedure postponed until the results are available.   Current Meds  Medication Sig   acetaminophen  (TYLENOL ) 500 MG tablet TAKE ONE TABLET BY MOUTH THREE TIMES A DAY AS NEEDED FOR MILD TO MODERATE PAIN, FEVER, BODY ACHES, HEADACHE   albuterol  (VENTOLIN  HFA) 108 (90 Base) MCG/ACT inhaler Inhale 2 puffs into the lungs every 6 (six) hours as needed for wheezing or shortness of breath.   Alcohol Swabs (ALCOHOL WIPES) 70 % PADS USE FOR INSULIN  INJECTIONS OR TO FINGER STICK AS DIRECTED   allopurinol  (ZYLOPRIM ) 300 MG tablet Take 1 tablet (300 mg total) by mouth daily.   amLODipine  (NORVASC ) 5 MG tablet Take 1 tablet (5 mg total) by mouth daily.   aspirin  81 MG tablet Take 81 mg by mouth daily.   azelastine  (OPTIVAR ) 0.05 % ophthalmic solution Place 1 drop into both eyes 2 (two) times daily.   calcium  carbonate (OS-CAL) 600 MG TABS tablet Take 600 mg by mouth daily.   carvedilol  (COREG ) 6.25 MG tablet Take 1 tablet (6.25 mg total) by mouth 2 (two) times daily with a meal.   cetirizine  (ZYRTEC ) 10 MG  tablet Take 1 tablet (10 mg total) by mouth daily.   Cholecalciferol  50 MCG (2000 UT) TABS 1 tab by mouth once daily   Continuous Blood Gluc Receiver (FREESTYLE LIBRE 14 DAY READER) DEVI 1 Device by Does not apply route 4 (four) times daily. E11.9   Continuous Blood Gluc Sensor (FREESTYLE LIBRE SENSOR SYSTEM) MISC 1 Device by Does not apply route every 14 (fourteen) days. E11.9   furosemide  (LASIX ) 40 MG tablet Take 1 tablet (40 mg total) by mouth daily as needed for edema.   gabapentin  (NEURONTIN ) 600 MG tablet 1 tab by mouth twice per day and 2 tabs  at bedtime   insulin  glargine (LANTUS  SOLOSTAR) 100 UNIT/ML Solostar Pen Inject 60 Units into the skin daily at 10 pm.   Insulin  Pen Needle (BD PEN NEEDLE NANO U/F) 32G X 4 MM MISC 1 with pen use daily   metFORMIN  (GLUCOPHAGE ) 1000 MG tablet Take 1 tablet (1,000 mg total) by mouth 2 (two) times daily with a meal.   nitroGLYCERIN  (NITROSTAT ) 0.4 MG SL tablet Place 1 tablet (0.4 mg total) under the tongue every 5 (five) minutes as needed for chest pain.   Omega-3 Fatty Acids (FISH OIL) 1000 MG CAPS TAKE TWO CAPSULES BY MOUTH DAILY ELEVATED TRIGLYCERIDES   omeprazole  (PRILOSEC) 20 MG capsule Take 1 capsule (20 mg total) by mouth 2 (two) times daily before a meal.   PARoxetine  (PAXIL ) 40 MG tablet Take 0.5 tablets (20 mg total) by mouth daily.   potassium chloride  (KLOR-CON ) 8 MEQ tablet Take 1 tablet (8 mEq total) by mouth daily.   rOPINIRole  (REQUIP ) 3 MG tablet Take 3 tablets (9 mg total) by mouth at bedtime.   rosuvastatin  (CRESTOR ) 10 MG tablet Take 1 tablet (10 mg total) by mouth daily.   Semaglutide ,0.25 or 0.5MG /DOS, (OZEMPIC , 0.25 OR 0.5 MG/DOSE,) 2 MG/3ML SOPN Take 0.5 mg subq once weekly   tamsulosin  (FLOMAX ) 0.4 MG CAPS capsule TAKE ONE CAPSULE BY MOUTH ONCE A DAY FOR URINATION AND PROSTATE   traZODone  (DESYREL ) 50 MG tablet Take 1 tablet (50 mg total) by mouth at bedtime as needed for sleep.   triamcinolone  (NASACORT ) 55 MCG/ACT AERO nasal inhaler Place 2 sprays into the nose daily.     Allergies  Allergen Reactions   Losartan      Other reaction(s): Angioedema (ALLERGY/intolerance)   Atorvastatin Other (See Comments)    Other reaction(s): Muscle pain   Keflex [Cephalexin]     unknown   Lisinopril Cough   Penicillins Rash    Has patient had a PCN reaction causing immediate rash, facial/tongue/throat swelling, SOB or lightheadedness with hypotension: No Has patient had a PCN reaction causing severe rash involving mucus membranes or skin necrosis: No Has patient had a PCN reaction  that required hospitalization: No Has patient had a PCN reaction occurring within the last 10 years: No If all of the above answers are NO, then may proceed with Cephalosporin use.    Sulfa Antibiotics Rash    rash   Sulfasalazine Rash    rash    Social History   Socioeconomic History   Marital status: Divorced    Spouse name: Not on file   Number of children: 3   Years of education: 9   Highest education level: Not on file  Occupational History   Occupation: retired    Comment: former truck Hospital doctor  Tobacco Use   Smoking status: Former    Current packs/day: 0.00    Average packs/day: 1.5 packs/day for  43.0 years (64.5 ttl pk-yrs)    Types: Cigarettes    Start date: 05/22/1945    Quit date: 05/22/1988    Years since quitting: 35.7   Smokeless tobacco: Never  Vaping Use   Vaping status: Never Used  Substance and Sexual Activity   Alcohol use: No   Drug use: No   Sexual activity: Not Currently  Other Topics Concern   Not on file  Social History Narrative   Fun/Hobby: Work    Denies abuse and feels safe at home.    Social Drivers of Corporate investment banker Strain: Low Risk  (01/28/2024)   Received from [facility]   Overall Financial Resource Strain (CARDIA)    How hard is it for you to pay for the very basics like food, housing, medical care, and heating?: Not very hard  Food Insecurity: No Food Insecurity (01/28/2024)   Received from [facility]   Hunger Vital Sign    Within the past 12 months, you worried that your food would run out before you got the money to buy more.: Never true    Within the past 12 months, the food you bought just didn't last and you didn't have money to get more.: Never true  Transportation Needs: No Transportation Needs (01/28/2024)   Received from [facility] - Transportation    In the past 12 months, has lack of transportation kept you from medical appointments or from getting medications?: No    In the past 12 months, has  lack of transportation kept you from meetings, work, or from getting things needed for daily living?: No  Physical Activity: Inactive (01/25/2022)   Exercise Vital Sign    Days of Exercise per Week: 0 days    Minutes of Exercise per Session: 0 min  Stress: No Stress Concern Present (01/25/2022)   [employer] of Occupational Health - Occupational Stress Questionnaire    Feeling of Stress : Not at all  Social Connections: Socially Isolated (01/25/2022)   Social Connection and Isolation Panel    Frequency of Communication with Friends and Family: Three times a week    Frequency of Social Gatherings with Friends and Family: Twice a week    Attends Religious Services: Never    Database administrator or Organizations: No    Attends Banker Meetings: Never    Marital Status: Divorced  Catering manager Violence: Not At Risk (02/14/2024)   Received from [facility]   HITS    Over the last 12 months how often did your partner physically hurt you?: Never    Over the last 12 months how often did your partner insult you or talk down to you?: Never    Over the last 12 months how often did your partner threaten you with physical harm?: Never    Over the last 12 months how often did your partner scream or curse at you?: Never     Review of Systems: General: negative for chills, fever, night sweats or weight changes.  Cardiovascular: negative for chest pain, dyspnea on exertion, edema, orthopnea, palpitations, paroxysmal nocturnal dyspnea or shortness of breath Dermatological: negative for rash Respiratory: negative for cough or wheezing Urologic: negative for hematuria Abdominal: negative for nausea, vomiting, diarrhea, bright red blood per rectum, melena, or hematemesis Neurologic: negative for visual changes, syncope, or dizziness All other systems reviewed and are otherwise negative except as noted above.    Blood pressure (!) 106/54, pulse 67, height 5'  8 (1.727 m), weight 252  lb (114.3 kg), SpO2 91%.  General appearance: alert and no distress Neck: no adenopathy, no JVD, supple, symmetrical, trachea midline, thyroid  not enlarged, symmetric, no tenderness/mass/nodules, and bilateral carotid bruits Lungs: clear to auscultation bilaterally Heart: regular rate and rhythm, S1, S2 normal, no murmur, click, rub or gallop Extremities: 1+ edema bilaterally Pulses: 2+ and symmetric Skin: Skin color, texture, turgor normal. No rashes or lesions Neurologic: Grossly normal  EKG EKG Interpretation Date/Time:  Tuesday February 19 2024 08:24:12 EDT Ventricular Rate:  63 PR Interval:  170 QRS Duration:  88 QT Interval:  398 QTC Calculation: 407 R Axis:   54  Text Interpretation: Normal sinus rhythm Normal ECG When compared with ECG of 27-Nov-2016 16:34, PREVIOUS ECG IS PRESENT Confirmed by Court Carrier [phone]) on 02/19/2024 8:36:42 AM    ASSESSMENT AND PLAN:   Essential hypertension History of essential hypertension with blood pressure measured today at 106/54.  He is on amlodipine  and carvedilol .  Hyperlipidemia History of hyperlipidemia on statin therapy with lipid profile performed 02/09/2022 revealing total cholesterol 82, LDL 30 and HDL of 32 followed by the [facility].  CAD (coronary artery disease), native coronary artery History of nonobstructive CAD by cath performed by myself 7/15 and again at [facility] by Dr. Isidor in 2018.  He is asymptomatic otherwise.  Preoperative clearance Patient scheduled for an elective outpatient bladder procedure tomorrow at the [facility].  He is cleared by cardiology point of view however since I auscultated bilateral carotid bruits carotid Doppler will need to be performed prior to his procedure.     Carrier DOROTHA Court MD FACP,FACC,FAHA, [facility] 02/19/2024 8:50 AM

## 2024-02-19 NOTE — Assessment & Plan Note (Signed)
 History of essential hypertension with blood pressure measured today at 106/54.  He is on amlodipine  and carvedilol .

## 2024-02-25 ENCOUNTER — Ambulatory Visit (HOSPITAL_COMMUNITY)
Admission: RE | Admit: 2024-02-25 | Discharge: 2024-02-25 | Disposition: A | Discharge status: Home / Self Care | Source: Ambulatory Visit | Attending: Cardiovascular Disease | Admitting: Cardiovascular Disease

## 2024-02-25 ENCOUNTER — Ambulatory Visit: Payer: Self-pay | Admitting: Cardiovascular Disease

## 2024-02-25 DIAGNOSIS — R0989 Other specified symptoms and signs involving the circulatory and respiratory systems: Secondary | ICD-10-CM | POA: Insufficient documentation
